# Patient Record
Sex: Male | Born: 1940 | Race: White | Hispanic: No | Smoking: Current every day smoker
Health system: Southern US, Community
[De-identification: ages and names within clinical notes are randomized; demographics above are authoritative.]

## PROBLEM LIST (undated history)

## (undated) DIAGNOSIS — J449 Chronic obstructive pulmonary disease, unspecified: Secondary | ICD-10-CM

## (undated) DIAGNOSIS — I2699 Other pulmonary embolism without acute cor pulmonale: Secondary | ICD-10-CM

## (undated) DIAGNOSIS — M1990 Unspecified osteoarthritis, unspecified site: Secondary | ICD-10-CM

## (undated) DIAGNOSIS — B019 Varicella without complication: Secondary | ICD-10-CM

## (undated) HISTORY — DX: Unspecified osteoarthritis, unspecified site: M19.90

## (undated) HISTORY — DX: Chronic obstructive pulmonary disease, unspecified: J44.9

## (undated) HISTORY — DX: Other pulmonary embolism without acute cor pulmonale: I26.99

## (undated) HISTORY — DX: Varicella without complication: B01.9

---
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
## 2006-09-23 HISTORY — PX: CHOLECYSTECTOMY: SHX55

## 2007-05-15 ENCOUNTER — Inpatient Hospital Stay: Payer: Self-pay | Admitting: Surgery

## 2007-05-17 ENCOUNTER — Other Ambulatory Visit: Payer: Self-pay

## 2012-10-22 ENCOUNTER — Inpatient Hospital Stay: Payer: Self-pay | Admitting: Internal Medicine

## 2012-10-22 DIAGNOSIS — R799 Abnormal finding of blood chemistry, unspecified: Secondary | ICD-10-CM | POA: Diagnosis not present

## 2012-10-22 DIAGNOSIS — J9600 Acute respiratory failure, unspecified whether with hypoxia or hypercapnia: Secondary | ICD-10-CM | POA: Diagnosis not present

## 2012-10-22 DIAGNOSIS — R748 Abnormal levels of other serum enzymes: Secondary | ICD-10-CM | POA: Diagnosis not present

## 2012-10-22 DIAGNOSIS — M1990 Unspecified osteoarthritis, unspecified site: Secondary | ICD-10-CM | POA: Diagnosis present

## 2012-10-22 DIAGNOSIS — Z8379 Family history of other diseases of the digestive system: Secondary | ICD-10-CM | POA: Diagnosis not present

## 2012-10-22 DIAGNOSIS — J159 Unspecified bacterial pneumonia: Secondary | ICD-10-CM | POA: Diagnosis not present

## 2012-10-22 DIAGNOSIS — R0602 Shortness of breath: Secondary | ICD-10-CM | POA: Diagnosis not present

## 2012-10-22 DIAGNOSIS — R0989 Other specified symptoms and signs involving the circulatory and respiratory systems: Secondary | ICD-10-CM | POA: Diagnosis not present

## 2012-10-22 DIAGNOSIS — J189 Pneumonia, unspecified organism: Secondary | ICD-10-CM | POA: Diagnosis not present

## 2012-10-22 DIAGNOSIS — R6889 Other general symptoms and signs: Secondary | ICD-10-CM | POA: Diagnosis not present

## 2012-10-22 DIAGNOSIS — Z9089 Acquired absence of other organs: Secondary | ICD-10-CM | POA: Diagnosis not present

## 2012-10-22 DIAGNOSIS — J441 Chronic obstructive pulmonary disease with (acute) exacerbation: Secondary | ICD-10-CM | POA: Diagnosis not present

## 2012-10-22 DIAGNOSIS — I214 Non-ST elevation (NSTEMI) myocardial infarction: Secondary | ICD-10-CM | POA: Diagnosis not present

## 2012-10-22 DIAGNOSIS — F17200 Nicotine dependence, unspecified, uncomplicated: Secondary | ICD-10-CM | POA: Diagnosis not present

## 2012-10-22 DIAGNOSIS — Z8249 Family history of ischemic heart disease and other diseases of the circulatory system: Secondary | ICD-10-CM | POA: Diagnosis not present

## 2012-10-22 DIAGNOSIS — J440 Chronic obstructive pulmonary disease with acute lower respiratory infection: Secondary | ICD-10-CM | POA: Diagnosis not present

## 2012-10-22 DIAGNOSIS — Z801 Family history of malignant neoplasm of trachea, bronchus and lung: Secondary | ICD-10-CM | POA: Diagnosis not present

## 2012-10-22 DIAGNOSIS — R0902 Hypoxemia: Secondary | ICD-10-CM | POA: Diagnosis not present

## 2012-10-22 LAB — CBC
HCT: 45.3 % (ref 40.0–52.0)
HGB: 15 g/dL (ref 13.0–18.0)
MCH: 29.9 pg (ref 26.0–34.0)
MCV: 90 fL (ref 80–100)
Platelet: 160 10*3/uL (ref 150–440)
RBC: 5.03 10*6/uL (ref 4.40–5.90)
RDW: 13.1 % (ref 11.5–14.5)
WBC: 10.3 10*3/uL (ref 3.8–10.6)

## 2012-10-22 LAB — COMPREHENSIVE METABOLIC PANEL
Albumin: 3.9 g/dL (ref 3.4–5.0)
Alkaline Phosphatase: 105 U/L (ref 50–136)
Anion Gap: 7 (ref 7–16)
Co2: 24 mmol/L (ref 21–32)
Creatinine: 0.87 mg/dL (ref 0.60–1.30)
EGFR (African American): >60
EGFR (Non-African Amer.): >60
Glucose: 112 mg/dL — ABNORMAL HIGH (ref 65–99)
Osmolality: 279 (ref 275–301)
Potassium: 4 mmol/L (ref 3.5–5.1)
SGOT(AST): 28 U/L (ref 15–37)
SGPT (ALT): 30 U/L (ref 12–78)
Total Protein: 7.6 g/dL (ref 6.4–8.2)

## 2012-10-22 LAB — PROTIME-INR
INR: 1
Prothrombin Time: 13.5 secs (ref 11.5–14.7)

## 2012-10-22 LAB — CK TOTAL AND CKMB (NOT AT ARMC): CK, Total: 135 U/L (ref 35–232)

## 2012-10-22 LAB — TROPONIN I: Troponin-I: 0.07 ng/mL — ABNORMAL HIGH

## 2012-10-22 LAB — RAPID INFLUENZA A&B ANTIGENS

## 2012-10-23 LAB — CBC WITH DIFFERENTIAL/PLATELET
Basophil %: 0.2 %
Eosinophil %: 0 %
HCT: 41.7 % (ref 40.0–52.0)
HGB: 13.8 g/dL (ref 13.0–18.0)
MCH: 29.9 pg (ref 26.0–34.0)
MCHC: 33.1 g/dL (ref 32.0–36.0)
MCV: 90 fL (ref 80–100)
Monocyte #: 0.4 x10 3/mm (ref 0.2–1.0)
Monocyte %: 4 %
Neutrophil %: 84.1 %
Platelet: 158 10*3/uL (ref 150–440)
RDW: 12.7 % (ref 11.5–14.5)
WBC: 10.4 10*3/uL (ref 3.8–10.6)

## 2012-10-23 LAB — LIPID PANEL
Cholesterol: 143 mg/dL (ref 0–200)
HDL Cholesterol: 26 mg/dL — ABNORMAL LOW (ref 40–60)
Ldl Cholesterol, Calc: 104 mg/dL — ABNORMAL HIGH (ref 0–100)
VLDL Cholesterol, Calc: 13 mg/dL (ref 5–40)

## 2012-10-23 LAB — TROPONIN I
Troponin-I: 0.04 ng/mL
Troponin-I: 0.03 ng/mL

## 2012-10-23 LAB — BASIC METABOLIC PANEL
Anion Gap: 7 (ref 7–16)
BUN: 18 mg/dL (ref 7–18)
Chloride: 108 mmol/L — ABNORMAL HIGH (ref 98–107)
Co2: 25 mmol/L (ref 21–32)
EGFR (Non-African Amer.): >60
Glucose: 189 mg/dL — ABNORMAL HIGH (ref 65–99)
Osmolality: 286 (ref 275–301)
Potassium: 4.2 mmol/L (ref 3.5–5.1)

## 2012-10-23 LAB — MAGNESIUM: Magnesium: 1.9 mg/dL

## 2012-10-23 LAB — CK-MB
CK-MB: 1.4 ng/mL (ref 0.5–3.6)
CK-MB: 2.7 ng/mL (ref 0.5–3.6)

## 2012-10-23 LAB — RAPID INFLUENZA A&B ANTIGENS

## 2012-10-24 LAB — BASIC METABOLIC PANEL
BUN: 23 mg/dL — ABNORMAL HIGH (ref 7–18)
Calcium, Total: 8.2 mg/dL — ABNORMAL LOW (ref 8.5–10.1)
Chloride: 106 mmol/L (ref 98–107)
Co2: 26 mmol/L (ref 21–32)
Creatinine: 0.91 mg/dL (ref 0.60–1.30)
EGFR (African American): >60
Osmolality: 285 (ref 275–301)

## 2012-10-24 LAB — HEMOGLOBIN A1C: Hemoglobin A1C: 5.7 % (ref 4.2–6.3)

## 2012-10-26 DIAGNOSIS — I517 Cardiomegaly: Secondary | ICD-10-CM

## 2012-10-28 LAB — CULTURE, BLOOD (SINGLE)

## 2012-10-28 LAB — CREATININE, SERUM: Creatinine: 0.91 mg/dL (ref 0.60–1.30)

## 2012-11-02 ENCOUNTER — Ambulatory Visit (INDEPENDENT_AMBULATORY_CARE_PROVIDER_SITE_OTHER): Payer: Medicare Other | Admitting: Adult Health

## 2012-11-02 ENCOUNTER — Encounter: Payer: Self-pay | Admitting: Adult Health

## 2012-11-02 VITALS — BP 143/83 | HR 97 | Temp 97.8°F | Resp 16 | Ht 75.0 in | Wt 234.0 lb

## 2012-11-02 DIAGNOSIS — Z0000 Encounter for general adult medical examination without abnormal findings: Secondary | ICD-10-CM

## 2012-11-02 DIAGNOSIS — M25519 Pain in unspecified shoulder: Secondary | ICD-10-CM

## 2012-11-02 DIAGNOSIS — M25561 Pain in right knee: Secondary | ICD-10-CM

## 2012-11-02 DIAGNOSIS — J439 Emphysema, unspecified: Secondary | ICD-10-CM

## 2012-11-02 DIAGNOSIS — M25569 Pain in unspecified knee: Secondary | ICD-10-CM | POA: Diagnosis not present

## 2012-11-02 DIAGNOSIS — Z125 Encounter for screening for malignant neoplasm of prostate: Secondary | ICD-10-CM | POA: Diagnosis not present

## 2012-11-02 DIAGNOSIS — J438 Other emphysema: Secondary | ICD-10-CM

## 2012-11-02 DIAGNOSIS — Z1211 Encounter for screening for malignant neoplasm of colon: Secondary | ICD-10-CM | POA: Diagnosis not present

## 2012-11-02 DIAGNOSIS — M25512 Pain in left shoulder: Secondary | ICD-10-CM

## 2012-11-02 MED ORDER — TIOTROPIUM BROMIDE MONOHYDRATE 18 MCG IN CAPS: 18.0000 ug | ORAL_CAPSULE | Freq: Every day | RESPIRATORY_TRACT | Status: DC

## 2012-11-02 MED ORDER — FLUTICASONE-SALMETEROL 250-50 MCG/DOSE IN AEPB: 1.0000 | INHALATION_SPRAY | Freq: Two times a day (BID) | RESPIRATORY_TRACT | Status: DC

## 2012-11-02 MED ORDER — FUROSEMIDE 20 MG PO TABS: ORAL_TABLET | ORAL | Status: DC

## 2012-11-02 NOTE — Patient Instructions (Addendum)
  Thank you for choosing [facility] for your health care needs.  Please have your labs drawn prior to leaving. I am checking the levels of your prostate.  I will notify you of your results.  I have called in a prescription for Lasix (fluid pill) 20 mg. Take 1/2 tablet daily as needed for swelling of your legs. If 1/2 tablet does not improve the swelling then take a whole tablet.  I have referred you for a colonoscopy.   I have also referred you to [facility] for evaluation of your left shoulder and numbness.  We will be obtaining your records from your previous hospitalization.

## 2012-11-03 ENCOUNTER — Encounter: Payer: Self-pay | Admitting: Adult Health

## 2012-11-03 DIAGNOSIS — Z125 Encounter for screening for malignant neoplasm of prostate: Secondary | ICD-10-CM | POA: Insufficient documentation

## 2012-11-03 DIAGNOSIS — Z0000 Encounter for general adult medical examination without abnormal findings: Secondary | ICD-10-CM | POA: Insufficient documentation

## 2012-11-03 DIAGNOSIS — M25561 Pain in right knee: Secondary | ICD-10-CM | POA: Insufficient documentation

## 2012-11-03 DIAGNOSIS — Z1211 Encounter for screening for malignant neoplasm of colon: Secondary | ICD-10-CM | POA: Insufficient documentation

## 2012-11-03 DIAGNOSIS — J439 Emphysema, unspecified: Secondary | ICD-10-CM | POA: Insufficient documentation

## 2012-11-03 DIAGNOSIS — M25512 Pain in left shoulder: Secondary | ICD-10-CM | POA: Insufficient documentation

## 2012-11-03 LAB — PSA: PSA: 6.26 ng/mL — ABNORMAL HIGH (ref 0.10–4.00)

## 2012-11-03 NOTE — Assessment & Plan Note (Signed)
Patient reports hx of rotator cuff injury. Appears that his symptoms are worsening with now some paresthesia and weakness in grip. Will refer back to orthopedic at [facility] for further evaluation and management.

## 2012-11-03 NOTE — Assessment & Plan Note (Signed)
Physical exam WNL. Patient has some bilateral LE edema. He is unable to tell me results of echo or other tests recently performed at [facility] during hospitalization. He does not know what labs were drawn. Will request this medical records to prevent duplication. Also, will provide patient with prescription for lasix 20 mg to be used prn for LE edema. This was given to him at the hospital with him reporting good results. Will check PSA levels as this has never been done. Will refer for screening colonoscopy which has never been done. Refer to orthopedic for f/u on his left shoulder pain.

## 2012-11-03 NOTE — Assessment & Plan Note (Signed)
Check PSA levels. This has never been checked.

## 2012-11-03 NOTE — Assessment & Plan Note (Addendum)
Recent hospitalization at [facility] for acute respiratory failure 2/2 COPD exacerbations. Patient reports multiple test done at the hospital. Will obtain medication records from this previous hospitalization. He is currently on Advair, Spiriva. Completed course of Levaquin and Prednisone taper. Patient has 50+ pack year history of smoking. Reports quitting the day before he was hospitalized and has not smoked since. Patient is not on oxygen and does not require this at the time. Sats are 95% on RA.

## 2012-11-03 NOTE — Assessment & Plan Note (Signed)
Pt has never had a screening colonoscopy. Has not seen PCP since 1948. Will refer to GI for screening colonoscopy.

## 2012-11-03 NOTE — Progress Notes (Signed)
Request sent for medical records to [facility]

## 2012-11-03 NOTE — Assessment & Plan Note (Signed)
Patient feels this is improved. He was seen at [facility] and knee replacement was recommended. Referral will be made should patient's symptoms worsen.

## 2012-11-03 NOTE — Progress Notes (Signed)
Subjective:    Patient ID: Seth Perez, male    DOB: [birth year], 72 y.o.   MRN: [id number]  HPI  Seth Perez is a 72 y/o male who was recently hospitalized at [facility] from 10/26/12 - 10/28/12 for acute respiratory failure 2/2 COPD exacerbation. Seth Perez is here for f/u and to establish care. He has not seen a regular provider since 1948. He has seen physicians for different reasons but he reports not having a PCP. He brings with him today his discharge instructions and discharge medications; however, he is not very clear on the details of his hospitalization. We will obtain records from [facility].  He also reports:  1) Left Shoulder pain - he saw a physician at [facility] in orthopedic department (does not remember physician's name) who diagnosed him with rotator cuff injury. Patient is experiencing pain in shoulder; however, he reports that he now has some feelings of tingling and numbness in arm and hand. He also reports loss of grip. He is able to move his extremity as well as his fingers. He denies coolness to touch or change in color of skin.  2) Right knee pain - also saw orthopedic surgeon who advised him he had arthritis and needed a knee replacement. He never went back to see the surgeon and states that his knee pain is improved.   Health Maintenance:  Pneumonia Vaccine - 2008  Colonoscopy - never done  PSA - never checked    Review of Systems  Constitutional: Negative for fever, chills, activity change, appetite change and fatigue.  HENT: Negative.   Eyes: Negative.   Respiratory: Positive for cough. Negative for shortness of breath and wheezing.        Occasional cough. Nothing chronic  Cardiovascular: Positive for leg swelling. Negative for chest pain and palpitations.       Bilateral LE swelling. Better since hospitalization.  Gastrointestinal: Negative for nausea, vomiting, abdominal pain, diarrhea, constipation and blood in stool.  Endocrine: Negative.   Genitourinary: Negative  for dysuria, frequency, hematuria, flank pain and difficulty urinating.  Musculoskeletal:       Pain in left shoulder. Mild neuropathy, paresthesia  Skin: Negative.   Neurological: Positive for weakness and numbness. Negative for dizziness, tremors, light-headedness and headaches.       Numbness and weakness left upper extremity.  Psychiatric/Behavioral: Negative.     BP 143/83  Pulse 97  Temp(Src) 97.8 F (36.6 C)  Resp 16  Ht 6\' 3"  (1.905 m)  Wt 234 lb (106.142 kg)  BMI 29.25 kg/m2  SpO2 95%     Objective:   Physical Exam  Constitutional: He is oriented to person, place, and time. He appears well-developed and well-nourished. No distress.  HENT:  Head: Normocephalic and atraumatic.  Right Ear: External ear normal.  Left Ear: External ear normal.  Mouth/Throat: Oropharynx is clear and moist.  Eyes: Conjunctivae and EOM are normal. Pupils are equal, round, and reactive to light.  Cardiovascular: Normal rate and regular rhythm.  Exam reveals no gallop and no friction rub.   No murmur heard. Pulmonary/Chest: Effort normal and breath sounds normal. No respiratory distress. He has no wheezes. He has no rales. He exhibits no tenderness.  Abdominal: Soft. Bowel sounds are normal. There is no tenderness.  Musculoskeletal: He exhibits edema.  Limited ROM in Left upper extremity. Trace to 1+ bilateral LE edema  Lymphadenopathy:    He has no cervical adenopathy.  Neurological: He is alert and oriented to person, place, and time. No cranial  nerve deficit. Coordination normal.  Unequal grips bilateral upper extremity. Right hand grip stronger than the left hand.  Skin: Skin is warm.  Skin is extremely dry and scaly on all extremities.  Psychiatric: He has a normal mood and affect. His behavior is normal. Judgment and thought content normal.        Assessment & Plan:

## 2012-11-09 ENCOUNTER — Other Ambulatory Visit: Payer: Self-pay | Admitting: Adult Health

## 2012-11-09 MED ORDER — CIPROFLOXACIN HCL 500 MG PO TABS: 500.0000 mg | ORAL_TABLET | Freq: Two times a day (BID) | ORAL | Status: DC

## 2012-11-09 NOTE — Progress Notes (Signed)
Elevated PSA. Will start Cipro x 14 days. Recheck PSA. If still elevated will refer to Urology.

## 2012-11-18 ENCOUNTER — Ambulatory Visit (INDEPENDENT_AMBULATORY_CARE_PROVIDER_SITE_OTHER): Payer: Medicare Other | Admitting: Internal Medicine

## 2012-11-18 ENCOUNTER — Encounter: Payer: Self-pay | Admitting: Internal Medicine

## 2012-11-18 ENCOUNTER — Telehealth: Payer: Self-pay | Admitting: Adult Health

## 2012-11-18 ENCOUNTER — Inpatient Hospital Stay: Payer: Self-pay | Admitting: Student

## 2012-11-18 VITALS — BP 104/73 | HR 115 | Temp 97.9°F | Resp 32 | Wt 232.5 lb

## 2012-11-18 DIAGNOSIS — J9600 Acute respiratory failure, unspecified whether with hypoxia or hypercapnia: Secondary | ICD-10-CM

## 2012-11-18 DIAGNOSIS — R000 Tachycardia, unspecified: Secondary | ICD-10-CM | POA: Diagnosis not present

## 2012-11-18 DIAGNOSIS — J441 Chronic obstructive pulmonary disease with (acute) exacerbation: Secondary | ICD-10-CM | POA: Diagnosis not present

## 2012-11-18 DIAGNOSIS — I2699 Other pulmonary embolism without acute cor pulmonale: Secondary | ICD-10-CM | POA: Diagnosis not present

## 2012-11-18 DIAGNOSIS — R0602 Shortness of breath: Secondary | ICD-10-CM | POA: Diagnosis not present

## 2012-11-18 DIAGNOSIS — N179 Acute kidney failure, unspecified: Secondary | ICD-10-CM | POA: Diagnosis not present

## 2012-11-18 LAB — COMPREHENSIVE METABOLIC PANEL
Albumin: 3.3 g/dL — ABNORMAL LOW (ref 3.4–5.0)
Alkaline Phosphatase: 104 U/L (ref 50–136)
Anion Gap: 10 (ref 7–16)
BUN: 18 mg/dL (ref 7–18)
Bilirubin,Total: 0.6 mg/dL (ref 0.2–1.0)
Calcium, Total: 8.7 mg/dL (ref 8.5–10.1)
Creatinine: 1.5 mg/dL — ABNORMAL HIGH (ref 0.60–1.30)
EGFR (African American): 53 — ABNORMAL LOW
EGFR (Non-African Amer.): 46 — ABNORMAL LOW
Glucose: 160 mg/dL — ABNORMAL HIGH (ref 65–99)
Potassium: 4.4 mmol/L (ref 3.5–5.1)
SGOT(AST): 46 U/L — ABNORMAL HIGH (ref 15–37)
SGPT (ALT): 65 U/L (ref 12–78)
Sodium: 138 mmol/L (ref 136–145)
Total Protein: 7.7 g/dL (ref 6.4–8.2)

## 2012-11-18 LAB — CBC WITH DIFFERENTIAL/PLATELET
Eosinophil #: 0 10*3/uL (ref 0.0–0.7)
Eosinophil %: 0.1 %
HCT: 40.6 % (ref 40.0–52.0)
Lymphocyte #: 1.5 10*3/uL (ref 1.0–3.6)
MCH: 30.5 pg (ref 26.0–34.0)
MCHC: 33.8 g/dL (ref 32.0–36.0)
MCV: 90 fL (ref 80–100)
Monocyte %: 9.7 %
RBC: 4.5 10*6/uL (ref 4.40–5.90)
RDW: 13.1 % (ref 11.5–14.5)
WBC: 11.9 10*3/uL — ABNORMAL HIGH (ref 3.8–10.6)

## 2012-11-18 LAB — TROPONIN I: Troponin-I: 0.73 ng/mL — ABNORMAL HIGH

## 2012-11-18 NOTE — Telephone Encounter (Signed)
Patient Information:  Caller Name: Liborio Nixon  Phone: [phone]  Patient: Seth Perez, Seth Perez  Gender: Male  DOB: 1941/06/18  Age: 72 Years  PCP: Orville Govern  Office Follow Up:  Does the office need to follow up with this patient?: No  Instructions For The Office: N/A  RN Note:  Allergic reaction question to medication.  Started taking Cipro on 02/18 and has been having a poor appetite, Since 02/21 has had 2 x loose stools,  Had a cold sweat with temperature normal but breathing was elevated 02/25.  Had heaves during the night.  He has eaten fruit and drinking.  Is urinating regularly but not normal amount.  Feels weak and tired.  Care advice given, but per nursing judgement feels like patient may need to be seen.  Symptoms  Reason For Call & Symptoms: Allergic reaction question.  Started taking Cipro on 02/18 and has been having a poor appetite, Has had 2 x loose stools,  Had a cold sweat with temperature normal  Reviewed Health History In EMR: Yes  Reviewed Medications In EMR: Yes  Reviewed Allergies In EMR: Yes  Reviewed Surgeries / Procedures: Yes  Date of Onset of Symptoms: 11/13/2012  Guideline(s) Used:  No Protocol Available - Sick Adult  Dizziness  Disposition Per Guideline:   Home Care  Reason For Disposition Reached:   Poor fluid intake probably causing dizziness  Advice Given:  Some Causes of Temporary Dizziness:  Poor Fluid Intake - Not drinking enough fluids and being a little dehydrated is a common cause of temporary dizziness. This is always worse during hot weather.  Standing Up Suddenly - Standing up suddenly (especially getting out of bed) or prolonged standing in one place are common causes of temporary dizziness. Not drinking enough fluids always makes it worse. Certain medications can cause or increase this type of dizziness (e.g., blood pressure medications).  Drink Fluids:  Drink several glasses of fruit juice, other clear fluids, or water. This will improve  hydration and blood glucose. If you have a fever or have had heat exposure, make sure the fluids are cold.  Call Back If:  Still feel dizzy after 2 hours of rest and fluids  Passes out (faints)  You become worse.  RN Overrode Recommendation:  Make Appointment  Multiple symptoms in last 24 hours.  Appointment Scheduled:  11/18/2012 16:15:00 Appointment Scheduled Provider:  Duncan Dull (Adults only)

## 2012-11-18 NOTE — Progress Notes (Signed)
Patient ID: Seth Perez, male   DOB: [birth year], 72 y.o.   MRN: [id number]   Patient Active Problem List  Diagnosis  . Pain in left shoulder  . Pain in right knee  . COPD (chronic obstructive pulmonary disease) with emphysema  . Colon cancer screening  . Prostate cancer screening  . Routine general medical examination at a health care facility  . Acute respiratory failure with hypoxia    Subjective:  CC:   Chief Complaint  Patient presents with  . Shortness of Breath    dizziness and nausea    HPI:   Seth Perez a 72 y.o. male who presents Shortness of breath.  Symptoms started on Monday with excessive dyspnea after taking out the trash. He notes that he had skipped his midnight dose of Symbicort.  Then on Tuesday he developed rigors, myalgias, nausea and  dry heaves.  Today in the office he is dyspneic and his respiratory rate is 35/minute,  Room air sats were 84% by pulse oximetry. He is accompanied by a family friend.   Past Medical History  Diagnosis Date  . Arthritis   . Chicken pox   . COPD (chronic obstructive pulmonary disease)     Past Surgical History  Procedure Laterality Date  . Cholecystectomy  2008  . Tonsillectomy and adenoidectomy  1947       The following portions of the patient's history were reviewed and updated as appropriate: Allergies, current medications, and problem list.    Review of Systems:   12 Pt  review of systems was negative except those addressed in the HPI,     History   Social History  . Marital Status: Legally Separated    Spouse Name: N/A    Number of Children: N/A  . Years of Education: N/A   Occupational History  . Not on file.   Social History Main Topics  . Smoking status: Former Smoker -- 1.00 packs/day for 50 years    Types: Cigarettes  . Smokeless tobacco: Not on file  . Alcohol Use: 1.0 oz/week    2 drink(s) per week  . Drug Use: No  . Sexually Active: Not on file   Other Topics Concern  .  Not on file   Social History Narrative  . No narrative on file    Objective:  BP 104/73  Pulse 115  Temp(Src) 97.9 F (36.6 C) (Oral)  Resp 32  Wt 232 lb 8 oz (105.461 kg)  BMI 29.06 kg/m2  SpO2 84%  General appearance: alert, cooperative and appears stated age Ears: normal TM's and external ear canals both ears Throat: lips, mucosa, and tongue normal; teeth and gums normal Neck: no adenopathy, no carotid bruit, supple, symmetrical, trachea midline and thyroid not enlarged, symmetric, no tenderness/mass/nodules Back: symmetric, no curvature. ROM normal. No CVA tenderness. Lungs: clear to auscultation bilaterally Heart: regular rate and rhythm, S1, S2 normal, no murmur, click, rub or gallop Abdomen: soft, non-tender; bowel sounds normal; no masses,  no organomegaly Pulses: 2+ and symmetric Skin: Skin color, texture, turgor normal. No rashes or lesions Lymph nodes: Cervical, supraclavicular, and axillary nodes normal.  Assessment and Plan:  Acute respiratory failure with hypoxia Patient was acutely dyspneic and hypoxicon room air at 84%. EMS was called and transported patient to [facility] emergency room. His symptoms are consistent with flu but the test was not done today due to the urgent need for stabilization.   Updated Medication List Outpatient Encounter Prescriptions as of 11/18/2012  Medication Sig  Dispense Refill  . ciprofloxacin (CIPRO) 500 MG tablet Take 1 tablet (500 mg total) by mouth 2 (two) times daily.  28 tablet  0  . Fluticasone-Salmeterol (ADVAIR) 250-50 MCG/DOSE AEPB Inhale 1 puff into the lungs every 12 (twelve) hours.  60 each  5  . furosemide (LASIX) 20 MG tablet Take 1/2 tablet in the morning as needed for leg swelling.  30 tablet  3  . tiotropium (SPIRIVA) 18 MCG inhalation capsule Place 1 capsule (18 mcg total) into inhaler and inhale daily.  30 capsule  5  . predniSONE (STERAPRED UNI-PAK) 10 MG tablet Take 10 mg by mouth daily.       No  facility-administered encounter medications on file as of 11/18/2012.

## 2012-11-19 ENCOUNTER — Telehealth: Payer: Self-pay | Admitting: Adult Health

## 2012-11-19 DIAGNOSIS — R000 Tachycardia, unspecified: Secondary | ICD-10-CM

## 2012-11-19 DIAGNOSIS — R7989 Other specified abnormal findings of blood chemistry: Secondary | ICD-10-CM

## 2012-11-19 LAB — BASIC METABOLIC PANEL
Anion Gap: 10 (ref 7–16)
Creatinine: 1.32 mg/dL — ABNORMAL HIGH (ref 0.60–1.30)
EGFR (African American): >60
EGFR (Non-African Amer.): 54 — ABNORMAL LOW
Glucose: 224 mg/dL — ABNORMAL HIGH (ref 65–99)
Potassium: 4.3 mmol/L (ref 3.5–5.1)
Sodium: 138 mmol/L (ref 136–145)

## 2012-11-19 LAB — CBC WITH DIFFERENTIAL/PLATELET
Basophil #: 0 10*3/uL (ref 0.0–0.1)
Basophil %: 0.3 %
Eosinophil #: 0 10*3/uL (ref 0.0–0.7)
Eosinophil %: 0 %
HCT: 37.4 % — ABNORMAL LOW (ref 40.0–52.0)
HGB: 12.3 g/dL — ABNORMAL LOW (ref 13.0–18.0)
Lymphocyte #: 0.9 10*3/uL — ABNORMAL LOW (ref 1.0–3.6)
Lymphocyte %: 8.9 %
MCH: 29.9 pg (ref 26.0–34.0)
Neutrophil #: 8.6 10*3/uL — ABNORMAL HIGH (ref 1.4–6.5)
Platelet: 188 10*3/uL (ref 150–440)

## 2012-11-19 LAB — CK TOTAL AND CKMB (NOT AT ARMC): CK-MB: 2.6 ng/mL (ref 0.5–3.6)

## 2012-11-19 LAB — LIPID PANEL
Cholesterol: 151 mg/dL (ref 0–200)
HDL Cholesterol: 26 mg/dL — ABNORMAL LOW (ref 40–60)
Ldl Cholesterol, Calc: 111 mg/dL — ABNORMAL HIGH (ref 0–100)
Triglycerides: 71 mg/dL (ref 0–200)

## 2012-11-19 LAB — TROPONIN I
Troponin-I: 0.7 ng/mL — ABNORMAL HIGH
Troponin-I: 0.7 ng/mL — ABNORMAL HIGH

## 2012-11-19 LAB — MAGNESIUM: Magnesium: 1.6 mg/dL — ABNORMAL LOW

## 2012-11-19 NOTE — Telephone Encounter (Signed)
Spoke to patient and he  will keep scheduled lab appointmnet

## 2012-11-19 NOTE — Telephone Encounter (Signed)
Pt calling from hospital regarding lab appt with [place] on 3/4 at 11:00.  Pt states this lab work is to recheck his PSA since he has been taking Prevo, however the pt states the hospital has not been giving him this medication.  Please advise pt if this test should be cancelled since he is not taking the medication.  Pt is at [facility] phone number [phone] but states he was told they may be moving him down the hall.  Please advise, concerned pt.

## 2012-11-19 NOTE — Telephone Encounter (Signed)
opened in error

## 2012-11-20 DIAGNOSIS — R0602 Shortness of breath: Secondary | ICD-10-CM

## 2012-11-21 ENCOUNTER — Encounter: Payer: Self-pay | Admitting: Internal Medicine

## 2012-11-21 DIAGNOSIS — J9601 Acute respiratory failure with hypoxia: Secondary | ICD-10-CM | POA: Insufficient documentation

## 2012-11-21 NOTE — Assessment & Plan Note (Addendum)
Patient was acutely dyspneic and hypoxicon room air at 84%. EMS was called and transported patient to [facility] emergency room. His symptoms are consistent with flu but the test was not done today due to the urgent need for stabilization.

## 2012-11-24 ENCOUNTER — Telehealth: Payer: Self-pay | Admitting: General Practice

## 2012-11-24 ENCOUNTER — Encounter: Payer: Self-pay | Admitting: Adult Health

## 2012-11-24 ENCOUNTER — Encounter: Payer: Self-pay | Admitting: Pulmonary Disease

## 2012-11-24 ENCOUNTER — Ambulatory Visit (INDEPENDENT_AMBULATORY_CARE_PROVIDER_SITE_OTHER): Payer: Medicare Other | Admitting: Adult Health

## 2012-11-24 ENCOUNTER — Other Ambulatory Visit (INDEPENDENT_AMBULATORY_CARE_PROVIDER_SITE_OTHER): Payer: Medicare Other

## 2012-11-24 ENCOUNTER — Ambulatory Visit (INDEPENDENT_AMBULATORY_CARE_PROVIDER_SITE_OTHER): Payer: Medicare Other | Admitting: Pulmonary Disease

## 2012-11-24 VITALS — BP 112/80 | HR 105 | Temp 97.2°F | Ht 75.0 in | Wt 228.0 lb

## 2012-11-24 VITALS — BP 125/88 | HR 90 | Temp 97.8°F | Resp 18

## 2012-11-24 DIAGNOSIS — Z125 Encounter for screening for malignant neoplasm of prostate: Secondary | ICD-10-CM

## 2012-11-24 LAB — CULTURE, BLOOD (SINGLE)

## 2012-11-24 NOTE — Progress Notes (Signed)
Subjective:    Patient ID: Seth Perez, male    DOB: 16-Apr-1941, 72 y.o.   MRN: [id number]  HPI  This is a very pleasant 72 year old male who comes her office today for evaluation of a pulmonary embolism and COPD. He had a normal childhood without respiratory illnesses. Unfortunately he started smoking one pack of cigarettes daily at age 59 and continued through January 2014. He was hospitalized in January 2014 for pneumonia and a COPD exacerbation. He was discharged on prednisone, with Spiriva, and ciprofloxacin. Approximately one week after hospital discharge he experienced increasing nausea, vomiting, diarrhea, chest pain, and shortness of breath. He was admitted to [facility] again in February 2014 and was found to have a markedly elevated troponin. Cardiology was consulted and ordered a CT chest which showed large bilateral pulmonary emboli. He was treated with anticoagulation which was converted to Xarelto at hospital discharge. Prednisone was continued in a tapering fashion and ciprofloxacin was changed to Levaquin for a presumed COPD exacerbation. He was discharged on Spiriva, Advair, and albuterol.  He states that his shortness of breath has improved slightly since hospital discharge but he is still somewhat dyspneic with talking for too long and minimal activity. His leg swelling has improved greatly since his hospital discharge.  He tells me that in the year prior to his hospitalization he was experiencing some increasing shortness of breath but he was still able to remain quite active. 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 He said that if he ever felt limited by his health it was due to knee and shoulder pain rather than any shortness of breath.  He also stated that he said leg swelling in his right greater than left legs for at least 3 years. This had been evaluated in  the past and he was told he had arthritis but he never been evaluated for a blood clot.  Past Medical History  Diagnosis Date  . Arthritis   . Chicken pox   . COPD (chronic obstructive pulmonary disease)   . Pulmonary emboli     on Xarelto     Family History  Problem Relation Age of Onset  . Arthritis Seth Perez   . Lung cancer Seth Perez     was a smoker  . Breast cancer Seth Perez   . Heart disease Seth Perez   . Heart disease Seth Perez   . Asthma Seth Perez   . Emphysema Seth Perez      History   Social History  . Marital Status: Legally Separated    Spouse Name: N/A    Number of Children: N/A  . Years of Education: N/A   Occupational History  . Not on file.   Social History Main Topics  . Smoking status: Former Smoker -- 1.00 packs/day for 50 years    Types: Cigarettes    Quit date: 10/12/2012  . Smokeless tobacco: Never Used  . Alcohol Use: 1.0 oz/week    2 drink(s) per week  . Drug Use: No  . Sexually Active: Not on file   Other Topics Concern  . Not on file   Social History Narrative  . No narrative on file     No Known Allergies   Outpatient Prescriptions Prior to Visit  Medication Sig Dispense Refill  . Fluticasone-Salmeterol (ADVAIR) 250-50 MCG/DOSE AEPB Inhale 1 puff into the lungs every 12 (twelve) hours.  60 each  5  . furosemide (LASIX) 20 MG tablet Take 1/2 tablet in the morning as needed for leg swelling.  30 tablet  3  . predniSONE (STERAPRED UNI-PAK) 10 MG tablet Tapered dose as directed      . tiotropium (SPIRIVA) 18 MCG inhalation capsule Place 1 capsule (18 mcg total) into inhaler and inhale daily.  30 capsule  5  . ciprofloxacin (CIPRO) 500 MG tablet Take 1 tablet (500 mg total) by mouth 2 (two) times daily.  28 tablet  0   No facility-administered medications prior to visit.      Review of Systems  Constitutional: Negative for fever, chills, activity change and appetite change.  HENT: Positive for nosebleeds  and postnasal drip. Negative for hearing loss, ear pain, congestion, rhinorrhea, sneezing, neck pain, neck stiffness and sinus pressure.   Eyes: Negative for redness, itching and visual disturbance.  Respiratory: Positive for cough and shortness of breath. Negative for chest tightness and wheezing.   Cardiovascular: Negative for chest pain, palpitations and leg swelling.  Gastrointestinal: Negative for nausea, vomiting, abdominal pain, diarrhea, constipation, blood in stool and abdominal distention.  Musculoskeletal: Positive for arthralgias. Negative for myalgias, joint swelling and gait problem.  Skin: Negative for rash.  Neurological: Positive for light-headedness. Negative for dizziness, numbness and headaches.  Hematological: Does not bruise/bleed easily.  Psychiatric/Behavioral: Negative for confusion and dysphoric mood.       Objective:   Physical Exam  Filed Vitals:   11/24/12 1147  BP: 112/80  Pulse: 105  Temp: 97.2 F (36.2 C)  TempSrc: Oral  Height: 6\' 3"  (1.905 m)  Weight: 228 lb (103.42 kg)  SpO2: 92%   Gen: no acute distress HEENT: NCAT, PERRL, EOMi, OP clear, neck supple without masses PULM: CTA B CV: RRR, no mgr, no JVD AB: BS+, soft, nontender, no hsm Ext: warm, trace pretibial edema, compression stockings, no clubbing, no cyanosis Derm: no rash or skin breakdown Neuro: A&Ox4, CN II-XII intact, strength 5/5 in all 4 extremities   February 2014 CT chest [facility]>> large bilateral pulmonary embolism, pleural-based pulmonary nodule in the right upper lobe (1.2 cm) as well as in the right middle lobe (4mm) 11/24/2012 Simple spiro> ratio 65%, FEV1 2.53 L (64% pred)    Assessment & Plan:   Acute pulmonary embolism The most likely explanation for the large, bilateral pulmonary emboli Seth Perez experienced in February 2014 is his hospitalization which occurred 2 weeks beforehand for pneumonia and COPD. He states that he has had years of leg swelling but was never  thoroughly evaluated for this so I question whether or not there could have been some preceding clot issues. At this point given the obvious risk factor it is reasonable to consider this a provoked pulmonary embolism and treat him for 6 months.  Plan: -Continues Xarelto for 6 months -Prior to discontinuing Xarelto, check lower ext Doppler ultrasound and repeat D-dimer -d-dimer today  Solitary pulmonary nodule I suspect this finding is related to the pneumonia he experienced in January 2014. It needs to be followed given his heavy prior smoking history.  Plan: -Repeat CT chest without contrast May 2014  COPD (chronic obstructive pulmonary disease) with emphysema COPD: GOLD Grade C, Stage 3 Combined recommendations from the [employer], [employer] of [employer] Physicians, Designer, television/film set, European Respiratory Society (Qaseem A et al, Ann Intern Med. 2011;155(3):179) recommends tobacco cessation, pulmonary rehab (for symptomatic patients with an FEV1 < 50% predicted), supplemental oxygen (for patients with SaO2 <88% or paO2 <55),  and appropriate bronchodilator therapy.  In regards to long acting bronchodilators, they recommend monotherapy (FEV1 60-80% with symptoms weak evidence, FEV1 with symptoms <60% strong evidence), or combination therapy (FEV1 <60% with symptoms, strong recommendation, moderate evidence).  One should also provide patients with annual immunizations and consider therapy for prevention of COPD exacerbations (ie. roflumilast or azithromycin) when appopriate.  -O2 therapy: not indicated -Immunizations:  Pneumonia 2008, Flu 2014 -Tobacco use: quit 2014 -Exercise: can start pulm rehab after cleared by Dr. Mariah Milling -Bronchodilator therapy: Stop advair, no real need for this given moderate obstruction; continue spiriva and albuterol -Exacerbation prevention: spiriva    Updated Medication List Outpatient Encounter Prescriptions as of 11/24/2012   Medication Sig Dispense Refill  . albuterol (VENTOLIN HFA) 108 (90 BASE) MCG/ACT inhaler Inhale 2 puffs into the lungs every 6 (six) hours as needed for wheezing.      . Fluticasone-Salmeterol (ADVAIR) 250-50 MCG/DOSE AEPB Inhale 1 puff into the lungs every 12 (twelve) hours.  60 each  5  . furosemide (LASIX) 20 MG tablet Take 1/2 tablet in the morning as needed for leg swelling.  30 tablet  3  . predniSONE (STERAPRED UNI-PAK) 10 MG tablet Tapered dose as directed      . Rivaroxaban (XARELTO) 15 MG TABS tablet Take 15 mg by mouth 2 (two) times daily. Take this dose and schedule until 12/09/12 which you will start Xarelto 20 mg once a day in the evening.      . tiotropium (SPIRIVA) 18 MCG inhalation capsule Place 1 capsule (18 mcg total) into inhaler and inhale daily.  30 capsule  5  . [DISCONTINUED] ciprofloxacin (CIPRO) 500 MG tablet Take 1 tablet (500 mg total) by mouth 2 (two) times daily.  28 tablet  0   No facility-administered encounter medications on file as of 11/24/2012.

## 2012-11-24 NOTE — Telephone Encounter (Signed)
Pt came in today for labs and son stated that when mr Kipper was in hospital the dr there said he needed to be set up with [facility] dr for his copd Also wanted to know if we needed to set his cardology follow up for 1-2 weeks  He saw dr Mariah Milling at [facility]

## 2012-11-24 NOTE — Telephone Encounter (Signed)
fmla paperwork in box

## 2012-11-24 NOTE — Patient Instructions (Signed)
  I will make an appointment for you to see Dr. Kendrick Fries, Pulmonology.  Please call Dr. Mariah Milling and Arida's office to schedule your post hospitalization f/u visit.  Return to clinic to see me in 1 month or sooner if needed.

## 2012-11-24 NOTE — Telephone Encounter (Signed)
Pt states that he is feeling better after the stay, still not 100%. Came into the office today for PSA recheck. Zella Ball was scheduling the follow up appt.

## 2012-11-24 NOTE — Assessment & Plan Note (Addendum)
Patient was just recently discharged from [facility] on 11/22/2012. He was seen in clinic today for followup of hospitalization. He is exhibiting shortness of breath with exertion, weakness. He was also recently diagnosed with chronic diastolic heart failure. Patient will be making a followup appointment with Dr. Mariah Milling. I am referring him to Dr. Kendrick Fries for his COPD and recent pulmonary emboli. Patient is currently staying with his son. Followup in my clinic in one month or sooner if needed. He has all of his medications. I have advised him to request refills prior to completely running out of his medications.

## 2012-11-24 NOTE — Progress Notes (Signed)
Subjective:    Patient ID: Seth Perez, male    DOB: Nov 02, 1940, 72 y.o.   MRN: [id number]  HPI  Patient is a 73 year old male who presents to clinic today for followup of recent hospitalization at [facility]. This is his second hospitalization within one month. He was hospitalized from 11/18/12 - 11/22/12 for acute respiratory failure secondary to bilateral pulmonary emboli, chronic obstructive pulmonary disease exacerbation, chronic diastolic heart failure. He was also hospitalized from 10/26/12 - 10/28/12 for acute respiratory failure 2/2 COPD exacerbation. He presents to clinic today with his son. Patient is feeling improved; however, he is still significantly short of breath with exertion. His oxygen saturation is currently 94% on room air.   Current Outpatient Prescriptions on File Prior to Visit  Medication Sig Dispense Refill  . Fluticasone-Salmeterol (ADVAIR) 250-50 MCG/DOSE AEPB Inhale 1 puff into the lungs every 12 (twelve) hours.  60 each  5  . furosemide (LASIX) 20 MG tablet Take 1/2 tablet in the morning as needed for leg swelling.  30 tablet  3  . predniSONE (STERAPRED UNI-PAK) 10 MG tablet Tapered dose as directed      . tiotropium (SPIRIVA) 18 MCG inhalation capsule Place 1 capsule (18 mcg total) into inhaler and inhale daily.  30 capsule  5   No current facility-administered medications on file prior to visit.      Review of Systems  Constitutional: Positive for fatigue. Negative for fever and chills.  HENT: Positive for congestion and postnasal drip. Negative for nosebleeds.        Dry nasal mucosa. Patient reports this is 2/2 oxygen use without humidification while in the hospital.  Eyes: Negative.   Respiratory: Positive for cough and shortness of breath. Negative for wheezing.   Cardiovascular: Negative for chest pain and leg swelling.  Gastrointestinal: Negative for nausea, vomiting, abdominal pain, constipation and blood in stool.  Genitourinary: Negative for dysuria,  hematuria and flank pain.  Musculoskeletal:       Knees occassionally painful 2/2 arthritis  Skin: Negative for pallor and rash.  Neurological: Positive for weakness. Negative for dizziness, syncope, light-headedness, numbness and headaches.  Hematological:       On Xarelto  Psychiatric/Behavioral: Negative for hallucinations, behavioral problems, confusion and agitation. The patient is not nervous/anxious.     BP 125/88  Pulse 90  Temp(Src) 97.8 F (36.6 C)  Resp 18  SpO2 94%     Objective:   Physical Exam  Constitutional: He is oriented to person, place, and time. He appears well-developed and well-nourished. No distress.  Recently discharged from hospital [facility] He is weak appearing, but stable.  HENT:  Head: Normocephalic and atraumatic.  Nasal mucosa with increased dryness. No bleeding noted.  Eyes: Conjunctivae are normal.  Neck: Normal range of motion. Neck supple. No tracheal deviation present.  Cardiovascular: Normal rate, regular rhythm, normal heart sounds and intact distal pulses.  Exam reveals no gallop.   No murmur heard. Pulmonary/Chest:  Shortness of breath with exertion. Rhonchi heard posteriorly on left. Cleared with cough. There was no wheezing heard or rales. Recent hospitalization for bilateral emboli.  Abdominal: Soft. Bowel sounds are normal. He exhibits no distension. There is no tenderness.  Musculoskeletal: Normal range of motion. He exhibits no edema.  Trace edema LLE. He has bilateral TED hose in use.  Lymphadenopathy:    He has no cervical adenopathy.  Neurological: He is alert and oriented to person, place, and time. No cranial nerve deficit.  Skin: Skin is warm and dry.  No rash noted. No erythema.  Psychiatric: He has a normal mood and affect. His behavior is normal. Judgment and thought content normal.           Assessment & Plan:

## 2012-11-24 NOTE — Patient Instructions (Addendum)
Keep taking the Xarelto Keep taking the Spiriva You can stop taking the Advair Follow up with Dr. Mariah Milling We will have you get another CT chest in three months to evaluate the pulmonary nodule We will see you back in 4-6 weeks.

## 2012-11-24 NOTE — Assessment & Plan Note (Addendum)
The most likely explanation for the large, bilateral pulmonary emboli Seth Perez experienced in February 2014 is his hospitalization which occurred 2 weeks beforehand for pneumonia and COPD. He states that he has had years of leg swelling but was never thoroughly evaluated for this so I question whether or not there could have been some preceding clot issues. At this point given the obvious risk factor it is reasonable to consider this a provoked pulmonary embolism and treat him for 6 months.  Plan: -Continues Xarelto for 6 months -Prior to discontinuing Xarelto, check lower ext Doppler ultrasound and repeat D-dimer -d-dimer today

## 2012-11-24 NOTE — Assessment & Plan Note (Signed)
COPD: GOLD Grade C, Stage 3 Combined recommendations from the [employer], [employer], Designer, television/film set, European Respiratory Society (Qaseem A et al, Ann Intern Med. 2011;155(3):179) recommends tobacco cessation, pulmonary rehab (for symptomatic patients with an FEV1 < 50% predicted), supplemental oxygen (for patients with SaO2 <88% or paO2 <55), and appropriate bronchodilator therapy.  In regards to long acting bronchodilators, they recommend monotherapy (FEV1 60-80% with symptoms weak evidence, FEV1 with symptoms <60% strong evidence), or combination therapy (FEV1 <60% with symptoms, strong recommendation, moderate evidence).  One should also provide patients with annual immunizations and consider therapy for prevention of COPD exacerbations (ie. roflumilast or azithromycin) when appopriate.  -O2 therapy: not indicated -Immunizations:  Pneumonia 2008, Flu 2014 -Tobacco use: quit 2014 -Exercise: can start pulm rehab after cleared by Dr. Mariah Milling -Bronchodilator therapy: Stop advair, no real need for this given moderate obstruction; continue spiriva and albuterol -Exacerbation prevention: spiriva

## 2012-11-24 NOTE — Telephone Encounter (Signed)
Form for FMLA filled out and patients son will be returning to pick it up

## 2012-11-24 NOTE — Assessment & Plan Note (Signed)
I suspect this finding is related to the pneumonia he experienced in January 2014. It needs to be followed given his heavy prior smoking history.  Plan: -Repeat CT chest without contrast May 2014

## 2012-11-25 ENCOUNTER — Other Ambulatory Visit: Payer: Self-pay | Admitting: Adult Health

## 2012-11-25 ENCOUNTER — Telehealth: Payer: Self-pay | Admitting: *Deleted

## 2012-11-25 LAB — D-DIMER, QUANTITATIVE: D-Dimer, Quant: 5.35 ug/mL-FEU — ABNORMAL HIGH (ref 0.00–0.48)

## 2012-11-25 NOTE — Telephone Encounter (Signed)
[facility] called stating pt has an alert D-dimer is 5.35

## 2012-11-25 NOTE — Telephone Encounter (Signed)
Called patient to notify him about his d-dimer results patient didn't answer will try again later

## 2012-11-25 NOTE — Telephone Encounter (Signed)
Noted.  This is not surprising.  Please let the patient know that this finding is expected due to his blood clot.  We drew the lab as a reference for when we decide to stop his Xarelto.

## 2012-11-26 ENCOUNTER — Ambulatory Visit: Payer: Medicare Other | Admitting: Adult Health

## 2012-12-02 ENCOUNTER — Ambulatory Visit (INDEPENDENT_AMBULATORY_CARE_PROVIDER_SITE_OTHER): Payer: Medicare Other | Admitting: Cardiovascular Disease

## 2012-12-02 ENCOUNTER — Encounter: Payer: Self-pay | Admitting: Cardiovascular Disease

## 2012-12-02 ENCOUNTER — Ambulatory Visit: Payer: Medicare Other | Admitting: Adult Health

## 2012-12-02 VITALS — BP 113/76 | HR 90 | Ht 75.0 in | Wt 231.5 lb

## 2012-12-02 DIAGNOSIS — I2699 Other pulmonary embolism without acute cor pulmonale: Secondary | ICD-10-CM

## 2012-12-02 DIAGNOSIS — R911 Solitary pulmonary nodule: Secondary | ICD-10-CM

## 2012-12-02 DIAGNOSIS — J9601 Acute respiratory failure with hypoxia: Secondary | ICD-10-CM

## 2012-12-02 DIAGNOSIS — J9600 Acute respiratory failure, unspecified whether with hypoxia or hypercapnia: Secondary | ICD-10-CM

## 2012-12-02 NOTE — Patient Instructions (Addendum)
You are doing well. Please decrease the xarelto to 20 mg daily on March 19 th  Please call us if you have new issues that need to be addressed before your next appt.  Your physician wants you to follow-up in: 5 months.  You will receive a reminder letter in the mail two months in advance. If you don't receive a letter, please call our office to schedule the follow-up appointment.

## 2012-12-02 NOTE — Assessment & Plan Note (Signed)
We'll continue on anticoagulation for an additional 5 months. At that time, we will see him back in the office

## 2012-12-02 NOTE — Assessment & Plan Note (Signed)
Repeat CT scan scheduled in May 2014 under the guidance of Dr. Kendrick Fries

## 2012-12-02 NOTE — Progress Notes (Signed)
Patient ID: Seth Perez, male    DOB: 25-Feb-1941, 72 y.o.   MRN: [id number]  HPI Comments:  Seth Perez is a 72 year old gentleman who is a long time smoker, quit recently after discharge from the hospital February 2013 for acute respiratory failure, COPD flare and bronchitis who read presented to the hospital with tachycardia, vomiting, hypoxia. He was diagnosed with bilateral PE and started on xarelto 50 mg twice a day. He presents to the office to establish care.  Overall he reports that he is doing well. He denies any lower extremity edema. This was a chronic problem for  up to 2 years. He's not taking Lasix. He is walking more, breathing has improved. He did have evaluation by Dr. Kendrick Fries of pulmonary and had PFTs. Currently on Advair but this is not prescribed to continue.  Overall he feels much better from when he was in the hospital. Breathing has improved, still not back to normal. Elevated cardiac enzymes in the hospital felt secondary to PE. Recent d-dimer greater than 5  Cholesterol in the hospital was 151, LDL 111, HDL 26  Echocardiogram in the hospital showed ejection fraction greater than 55%, diastolic dysfunction, normal right ventricular systolic pressures  EKG today shows normal sinus rhythm with rate 90 beats per minute, rare APC, poor progression through the anterior precordial leads   Outpatient Encounter Prescriptions as of 12/02/2012  Medication Sig Dispense Refill  . albuterol (VENTOLIN HFA) 108 (90 BASE) MCG/ACT inhaler Inhale 2 puffs into the lungs every 6 (six) hours as needed for wheezing.      . Fluticasone-Salmeterol (ADVAIR) 250-50 MCG/DOSE AEPB Inhale 1 puff into the lungs every 12 (twelve) hours.  60 each  5  . furosemide (LASIX) 20 MG tablet Take 1/2 tablet in the morning as needed for leg swelling.  30 tablet  3  . Rivaroxaban (XARELTO) 15 MG TABS tablet Take 15 mg by mouth 2 (two) times daily. Take this dose and schedule until 12/09/12 which you will  start Xarelto 20 mg once a day in the evening.      . tiotropium (SPIRIVA) 18 MCG inhalation capsule Place 1 capsule (18 mcg total) into inhaler and inhale daily.  30 capsule  5  . [DISCONTINUED] predniSONE (STERAPRED UNI-PAK) 10 MG tablet Tapered dose as directed       No facility-administered encounter medications on file as of 12/02/2012.     Review of Systems  Constitutional: Negative.   HENT: Negative.   Eyes: Negative.   Respiratory: Positive for shortness of breath.   Cardiovascular: Negative.   Gastrointestinal: Negative.   Musculoskeletal: Negative.   Skin: Negative.   Neurological: Negative.   Psychiatric/Behavioral: Negative.   All other systems reviewed and are negative.    BP 113/76  Pulse 90  Ht 6\' 3"  (1.905 m)  Wt 231 lb 8 oz (105.008 kg)  BMI 28.94 kg/m2  Physical Exam  Nursing note and vitals reviewed. Constitutional: He is oriented to person, place, and time. He appears well-developed and well-nourished.  HENT:  Head: Normocephalic.  Nose: Nose normal.  Mouth/Throat: Oropharynx is clear and moist.  Eyes: Conjunctivae are normal. Pupils are equal, round, and reactive to light.  Neck: Normal range of motion. Neck supple. No JVD present.  Cardiovascular: Normal rate, regular rhythm, S1 normal, S2 normal, normal heart sounds and intact distal pulses.  Exam reveals no gallop and no friction rub.   No murmur heard. Pulmonary/Chest: Effort normal and breath sounds normal. No respiratory distress. He has  no wheezes. He has no rales. He exhibits no tenderness.  Abdominal: Soft. Bowel sounds are normal. He exhibits no distension. There is no tenderness.  Musculoskeletal: Normal range of motion. He exhibits no edema and no tenderness.  Lymphadenopathy:    He has no cervical adenopathy.  Neurological: He is alert and oriented to person, place, and time. Coordination normal.  Skin: Skin is warm and dry. No rash noted. No erythema.  Psychiatric: He has a normal mood  and affect. His behavior is normal. Judgment and thought content normal.      Assessment and Plan

## 2012-12-02 NOTE — Assessment & Plan Note (Signed)
Long history of smoking, COPD. We have encouraged him to call pulmonary or primary care if he develops symptoms consistent with COPD flare or bronchitis

## 2012-12-08 ENCOUNTER — Ambulatory Visit: Payer: Medicare Other | Admitting: Pulmonary Disease

## 2012-12-14 DIAGNOSIS — R3129 Other microscopic hematuria: Secondary | ICD-10-CM | POA: Diagnosis not present

## 2012-12-14 DIAGNOSIS — N401 Enlarged prostate with lower urinary tract symptoms: Secondary | ICD-10-CM | POA: Diagnosis not present

## 2012-12-14 DIAGNOSIS — R972 Elevated prostate specific antigen [PSA]: Secondary | ICD-10-CM | POA: Diagnosis not present

## 2012-12-14 DIAGNOSIS — R339 Retention of urine, unspecified: Secondary | ICD-10-CM | POA: Diagnosis not present

## 2012-12-16 ENCOUNTER — Other Ambulatory Visit: Payer: Self-pay | Admitting: Internal Medicine

## 2012-12-21 DIAGNOSIS — Z09 Encounter for follow-up examination after completed treatment for conditions other than malignant neoplasm: Secondary | ICD-10-CM | POA: Diagnosis not present

## 2013-01-06 DIAGNOSIS — N401 Enlarged prostate with lower urinary tract symptoms: Secondary | ICD-10-CM | POA: Insufficient documentation

## 2013-01-06 DIAGNOSIS — R3129 Other microscopic hematuria: Secondary | ICD-10-CM | POA: Insufficient documentation

## 2013-01-06 DIAGNOSIS — R339 Retention of urine, unspecified: Secondary | ICD-10-CM | POA: Insufficient documentation

## 2013-01-06 DIAGNOSIS — R972 Elevated prostate specific antigen [PSA]: Secondary | ICD-10-CM | POA: Insufficient documentation

## 2013-01-07 DIAGNOSIS — R972 Elevated prostate specific antigen [PSA]: Secondary | ICD-10-CM | POA: Diagnosis not present

## 2013-01-26 ENCOUNTER — Ambulatory Visit (INDEPENDENT_AMBULATORY_CARE_PROVIDER_SITE_OTHER): Payer: Medicare Other | Admitting: Pulmonary Disease

## 2013-01-26 ENCOUNTER — Encounter: Payer: Self-pay | Admitting: Pulmonary Disease

## 2013-01-26 VITALS — BP 100/70 | HR 101 | Temp 98.0°F | Ht 75.0 in | Wt 245.0 lb

## 2013-01-26 DIAGNOSIS — Z86718 Personal history of other venous thrombosis and embolism: Secondary | ICD-10-CM

## 2013-01-26 DIAGNOSIS — I2699 Other pulmonary embolism without acute cor pulmonale: Secondary | ICD-10-CM | POA: Diagnosis not present

## 2013-01-26 DIAGNOSIS — R911 Solitary pulmonary nodule: Secondary | ICD-10-CM

## 2013-01-26 DIAGNOSIS — J438 Other emphysema: Secondary | ICD-10-CM

## 2013-01-26 DIAGNOSIS — J439 Emphysema, unspecified: Secondary | ICD-10-CM

## 2013-01-26 MED ORDER — RIVAROXABAN 20 MG PO TABS: 20.0000 mg | ORAL_TABLET | Freq: Every day | ORAL | Status: DC

## 2013-01-26 NOTE — Patient Instructions (Addendum)
We will call you with the results of your CT  We will see you back in late July or early August

## 2013-01-26 NOTE — Assessment & Plan Note (Signed)
Provoked pulmonary embolism, currently asymptomatic.   Plan: -Continue Xarelto through late July 2014. -Check d-dimer and lower extremity Doppler ultrasound in July 2014

## 2013-01-26 NOTE — Assessment & Plan Note (Signed)
We will order a CT scan of his chest without contrast for this month to followup this lesion. It has an atypical appearance and I question whether or not it was related to a chest infection during his January through February lengthy hospitalization with a respiratory illness.

## 2013-01-26 NOTE — Assessment & Plan Note (Signed)
He continues to do well with Spiriva alone.  Plan: -Spiriva -Exercise as much as possible -Annual flu shot

## 2013-01-26 NOTE — Progress Notes (Signed)
Subjective:    Patient ID: Seth Perez, male    DOB: May 14, 1941, 72 y.o.   MRN: [id number]  Synopsis: Seth Perez first saw the [facility] pulmonary clinic in March of 2014 for evaluation of pulmonary embolism, a pulmonary nodule and COPD. He had moderate obstruction on pulmonary function testing and was prescribed Spiriva. He had a provoked pulmonary embolism in late February 2014 and was treated for this with Xarelto. He also had a 1.2 cm pleural-based nodule in the right upper lobe.  HPI  01/26/2013 routine office visit>> Seth Perez has been doing well since her last visit. He has not had shortness of breath, cough, wheezing, or chest pain. He continues to have some mild lower extremity leg swelling which has not changed since her last visit. He had some nosebleeds on Xarelto but they were very mild and self-limited. He has been doing well with the Spiriva and continues to take it every day.   Past Medical History  Diagnosis Date  . Arthritis   . Chicken pox   . COPD (chronic obstructive pulmonary disease)   . Pulmonary emboli     on Xarelto     Review of Systems  Constitutional: Negative for fever, chills and fatigue.  HENT: Negative for sneezing, postnasal drip and sinus pressure.   Respiratory: Negative for cough, shortness of breath and wheezing.   Cardiovascular: Positive for leg swelling. Negative for chest pain and palpitations.       Objective:   Physical Exam  Filed Vitals:   01/26/13 1643  BP: 100/70  Pulse: 101  Temp: 98 F (36.7 C)  TempSrc: Oral  Height: 6\' 3"  (1.905 m)  Weight: 245 lb (111.131 kg)  SpO2: 94%  RA  Gen: well appearing, no acute distress HEENT: NCAT, EOMi, OP clear, PULM: CTA B CV: RRR, no mgr, no JVD AB: BS+, soft, nontender, no hsm Ext: warm, trace bilateral leg edema, no clubbing, no cyanosis      Assessment & Plan:   Acute pulmonary embolism Provoked pulmonary embolism, currently asymptomatic.   Plan: -Continue  Xarelto through late July 2014. -Check d-dimer and lower extremity Doppler ultrasound in July 2014  COPD (chronic obstructive pulmonary disease) with emphysema He continues to do well with Spiriva alone.  Plan: -Spiriva -Exercise as much as possible -Annual flu shot  Solitary pulmonary nodule We will order a CT scan of his chest without contrast for this month to followup this lesion. It has an atypical appearance and I question whether or not it was related to a chest infection during his January through February lengthy hospitalization with a respiratory illness.   Updated Medication List Outpatient Encounter Prescriptions as of 01/26/2013  Medication Sig Dispense Refill  . albuterol (VENTOLIN HFA) 108 (90 BASE) MCG/ACT inhaler Inhale 2 puffs into the lungs every 6 (six) hours as needed for wheezing.      . Fluticasone-Salmeterol (ADVAIR) 250-50 MCG/DOSE AEPB Inhale 1 puff into the lungs every 12 (twelve) hours.  60 each  5  . furosemide (LASIX) 20 MG tablet Take 1/2 tablet in the morning as needed for leg swelling.  30 tablet  3  . Rivaroxaban (XARELTO) 20 MG TABS Take 1 tablet (20 mg total) by mouth daily.  30 tablet  3  . SPIRIVA HANDIHALER 18 MCG inhalation capsule INHALE 1 CAPSULE VIA HANDIHALER ONCE DAILY AT THE SAME TIME EVERY DAY  30 capsule  5  . tamsulosin (FLOMAX) 0.4 MG CAPS Take 0.4 mg by mouth daily.      . [  DISCONTINUED] Rivaroxaban (XARELTO) 20 MG TABS Take 20 mg by mouth daily.      . [DISCONTINUED] Rivaroxaban (XARELTO) 15 MG TABS tablet Take 15 mg by mouth 2 (two) times daily. Take this dose and schedule until 12/09/12 which you will start Xarelto 20 mg once a day in the evening.       No facility-administered encounter medications on file as of 01/26/2013.

## 2013-02-02 ENCOUNTER — Ambulatory Visit: Payer: Self-pay | Admitting: Pulmonary Disease

## 2013-02-02 DIAGNOSIS — J984 Other disorders of lung: Secondary | ICD-10-CM | POA: Diagnosis not present

## 2013-02-02 DIAGNOSIS — R911 Solitary pulmonary nodule: Secondary | ICD-10-CM | POA: Diagnosis not present

## 2013-02-02 DIAGNOSIS — R918 Other nonspecific abnormal finding of lung field: Secondary | ICD-10-CM | POA: Diagnosis not present

## 2013-02-10 ENCOUNTER — Encounter: Payer: Self-pay | Admitting: Pulmonary Disease

## 2013-02-11 ENCOUNTER — Telehealth: Payer: Self-pay | Admitting: *Deleted

## 2013-02-11 NOTE — Telephone Encounter (Signed)
Message copied by Christen Butter on Thu Feb 11, 2013  3:47 PM ------      Message from: Max Fickle B      Created: Wed Feb 10, 2013  8:49 PM       L,            Please let him know that his Chest CT was OK            Thanks      B ------

## 2013-02-11 NOTE — Telephone Encounter (Signed)
Spoke with pt and notified of results per Dr.McQuaid. Pt verbalized understanding and denied any questions. 

## 2013-03-01 ENCOUNTER — Encounter: Payer: Self-pay | Admitting: Pulmonary Disease

## 2013-03-16 DIAGNOSIS — R05 Cough: Secondary | ICD-10-CM | POA: Diagnosis not present

## 2013-03-16 DIAGNOSIS — Z1211 Encounter for screening for malignant neoplasm of colon: Secondary | ICD-10-CM | POA: Diagnosis not present

## 2013-03-16 DIAGNOSIS — R059 Cough, unspecified: Secondary | ICD-10-CM | POA: Diagnosis not present

## 2013-03-16 DIAGNOSIS — Z7901 Long term (current) use of anticoagulants: Secondary | ICD-10-CM | POA: Diagnosis not present

## 2013-03-23 ENCOUNTER — Telehealth: Payer: Self-pay

## 2013-03-23 NOTE — Telephone Encounter (Signed)
[facility] requesting cardiac clearance for pt to have colonoscopy Is on Xarelto

## 2013-03-24 DIAGNOSIS — N401 Enlarged prostate with lower urinary tract symptoms: Secondary | ICD-10-CM | POA: Diagnosis not present

## 2013-03-24 DIAGNOSIS — R339 Retention of urine, unspecified: Secondary | ICD-10-CM | POA: Diagnosis not present

## 2013-03-24 DIAGNOSIS — R972 Elevated prostate specific antigen [PSA]: Secondary | ICD-10-CM | POA: Diagnosis not present

## 2013-03-24 DIAGNOSIS — R3129 Other microscopic hematuria: Secondary | ICD-10-CM | POA: Diagnosis not present

## 2013-03-24 NOTE — Telephone Encounter (Signed)
He can stop the anticoagulation medication 2 days before the procedure, restart 24 hours after the procedure (would confirm with GI Dr. that it is okay to restart anticoagulation)

## 2013-03-24 NOTE — Telephone Encounter (Signed)
Letter faxed.

## 2013-04-07 ENCOUNTER — Ambulatory Visit: Payer: Self-pay | Admitting: Pulmonary Disease

## 2013-04-07 DIAGNOSIS — Z86718 Personal history of other venous thrombosis and embolism: Secondary | ICD-10-CM | POA: Diagnosis not present

## 2013-04-07 DIAGNOSIS — Z09 Encounter for follow-up examination after completed treatment for conditions other than malignant neoplasm: Secondary | ICD-10-CM | POA: Diagnosis not present

## 2013-04-20 ENCOUNTER — Telehealth: Payer: Self-pay | Admitting: *Deleted

## 2013-04-20 ENCOUNTER — Encounter: Payer: Self-pay | Admitting: Pulmonary Disease

## 2013-04-20 NOTE — Telephone Encounter (Signed)
Message copied by Christen Butter on Tue Apr 20, 2013  9:20 AM ------      Message from: Max Fickle B      Created: Tue Apr 20, 2013  8:55 AM       L,            Can we let him know that his LE doppler ultrasound was negative for DVT?  We should see him in clinic in the next few weeks.  He doesn't have an appointment as best I can tell.            Thanks,      B ------

## 2013-04-20 NOTE — Telephone Encounter (Signed)
LMTCB for the pt 

## 2013-04-22 NOTE — Telephone Encounter (Signed)
Returning call can be reached at [phone].Raylene Everts

## 2013-04-22 NOTE — Telephone Encounter (Signed)
I spoke with patient about results and he verbalized understanding and had no questions appt scheduled 

## 2013-05-04 ENCOUNTER — Ambulatory Visit (INDEPENDENT_AMBULATORY_CARE_PROVIDER_SITE_OTHER): Payer: Medicare Other | Admitting: Pulmonary Disease

## 2013-05-04 ENCOUNTER — Other Ambulatory Visit: Payer: Self-pay | Admitting: Pulmonary Disease

## 2013-05-04 ENCOUNTER — Encounter: Payer: Self-pay | Admitting: Pulmonary Disease

## 2013-05-04 VITALS — BP 94/60 | HR 68 | Temp 98.4°F | Ht 75.0 in | Wt 233.0 lb

## 2013-05-04 DIAGNOSIS — J438 Other emphysema: Secondary | ICD-10-CM | POA: Diagnosis not present

## 2013-05-04 DIAGNOSIS — I2699 Other pulmonary embolism without acute cor pulmonale: Secondary | ICD-10-CM

## 2013-05-04 DIAGNOSIS — R911 Solitary pulmonary nodule: Secondary | ICD-10-CM | POA: Diagnosis not present

## 2013-05-04 DIAGNOSIS — J439 Emphysema, unspecified: Secondary | ICD-10-CM

## 2013-05-04 NOTE — Patient Instructions (Signed)
Stop taking the Xarelto Keep taking the Spiriva I recommend that you get compression stockings in the 20-30 pressure range; hip height We will schedule a CT scan of your chest at [facility] in 07/2013 to follow the nodule  We will see you after the CT in November

## 2013-05-04 NOTE — Progress Notes (Signed)
Subjective:    Patient ID: Seth Perez, male    DOB: Jan 11, 1941, 72 y.o.   MRN: [id number]  Synopsis: Seth Perez first saw the [facility] pulmonary clinic in March of 2014 for evaluation of pulmonary embolism, a pulmonary nodule and COPD. He had moderate obstruction on pulmonary function testing and was prescribed Spiriva. He had a provoked pulmonary embolism in late February 2014 and was treated for this with Xarelto. He also had a 1.2 cm pleural-based nodule in the right upper lobe.  HPI   01/26/2013 routine office visit>> Seth Perez has been doing well since her last visit. He has not had shortness of breath, cough, wheezing, or chest pain. He continues to have some mild lower extremity leg swelling which has not changed since her last visit. He had some nosebleeds on Xarelto but they were very mild and self-limited. He has been doing well with the Spiriva and continues to take it every day.  05/04/2013 ROV >> Seth Perez has been doing very well since the last visit. He is frustrated by cost of his medications. He stopped taking the Advair. His leg swelling has been about the same since the last visit. He has not started using compression stockings because of the cost. He does not have problems with shortness of breath or cough. He has not had significant bleeding on his anticoagulation therapy.   Past Medical History  Diagnosis Date  . Arthritis   . Chicken pox   . COPD (chronic obstructive pulmonary disease)   . Pulmonary emboli     on Xarelto     Review of Systems  Constitutional: Negative for fever, chills and fatigue.  HENT: Negative for sneezing, postnasal drip and sinus pressure.   Respiratory: Negative for cough, shortness of breath and wheezing.   Cardiovascular: Positive for leg swelling. Negative for chest pain and palpitations.       Objective:   Physical Exam   Filed Vitals:   05/04/13 1225  BP: 94/60  Pulse: 68  Temp: 98.4 F (36.9 C)  TempSrc: Oral   Height: 6\' 3"  (1.905 m)  Weight: 233 lb (105.688 kg)  SpO2: 95%  RA  Gen: well appearing, no acute distress HEENT: NCAT, EOMi, OP clear, PULM: CTA B CV: RRR, no mgr, no JVD AB: BS+, soft, nontender, no hsm Ext: warm, chronic venous stasis changes bilaterally, no clubbing, no cyanosis      Assessment & Plan:   Acute pulmonary embolism He has completed 6 months of anticoagulation for his provoked pulmonary embolism. His most recent ultrasound showed no evidence of blood clot  Plan: -Stop anticoagulation  COPD (chronic obstructive pulmonary disease) with emphysema This is been a stable interval for Seth Perez.  Plan: -Continue Spiriva alone  Solitary pulmonary nodule I believe that these very small nodules were likely related to a prior infection. There was no growth seen on the May 2014 study.  Plan: -CT chest November 2014 to followup nodules    Updated Medication List Outpatient Encounter Prescriptions as of 05/04/2013  Medication Sig Dispense Refill  . albuterol (VENTOLIN HFA) 108 (90 BASE) MCG/ACT inhaler Inhale 2 puffs into the lungs every 6 (six) hours as needed for wheezing.      . furosemide (LASIX) 20 MG tablet Take 1/2 tablet in the morning as needed for leg swelling.  30 tablet  3  . Rivaroxaban (XARELTO) 20 MG TABS Take 1 tablet (20 mg total) by mouth daily.  30 tablet  3  . SPIRIVA HANDIHALER 18 MCG inhalation  capsule INHALE 1 CAPSULE VIA HANDIHALER ONCE DAILY AT THE SAME TIME EVERY DAY  30 capsule  5  . tamsulosin (FLOMAX) 0.4 MG CAPS Take 0.4 mg by mouth daily.      . [DISCONTINUED] Fluticasone-Salmeterol (ADVAIR) 250-50 MCG/DOSE AEPB Inhale 1 puff into the lungs every 12 (twelve) hours.  60 each  5   No facility-administered encounter medications on file as of 05/04/2013.

## 2013-05-04 NOTE — Assessment & Plan Note (Signed)
This is been a stable interval for Rondrick.  Plan: -Continue Spiriva alone

## 2013-05-04 NOTE — Assessment & Plan Note (Signed)
He has completed 6 months of anticoagulation for his provoked pulmonary embolism. His most recent ultrasound showed no evidence of blood clot  Plan: -Stop anticoagulation

## 2013-05-04 NOTE — Assessment & Plan Note (Signed)
I believe that these very small nodules were likely related to a prior infection. There was no growth seen on the May 2014 study.  Plan: -CT chest November 2014 to followup nodules

## 2013-06-17 LAB — HM COLONOSCOPY

## 2013-07-27 ENCOUNTER — Ambulatory Visit: Payer: Self-pay | Admitting: Pulmonary Disease

## 2013-07-27 DIAGNOSIS — R911 Solitary pulmonary nodule: Secondary | ICD-10-CM | POA: Diagnosis not present

## 2013-07-27 DIAGNOSIS — J984 Other disorders of lung: Secondary | ICD-10-CM | POA: Diagnosis not present

## 2013-07-27 DIAGNOSIS — I2510 Atherosclerotic heart disease of native coronary artery without angina pectoris: Secondary | ICD-10-CM | POA: Diagnosis not present

## 2013-07-29 ENCOUNTER — Telehealth: Payer: Self-pay

## 2013-07-29 ENCOUNTER — Encounter: Payer: Self-pay | Admitting: Pulmonary Disease

## 2013-07-29 DIAGNOSIS — R918 Other nonspecific abnormal finding of lung field: Secondary | ICD-10-CM

## 2013-07-29 NOTE — Telephone Encounter (Signed)
Pt aware of info.  Placed order for CT w/o in 6 mos at [facility]. Seth Perez L

## 2013-07-29 NOTE — Telephone Encounter (Signed)
Message copied by Velvet Bathe on Thu Jul 29, 2013  1:50 PM ------      Message from: Max Fickle B      Created: Thu Jul 29, 2013  1:26 PM       A,            Please let him know that his pulmonary nodules have not changed, this is good.            Our next CT needs to be 6 months from now            Thanks,      B ------

## 2013-08-06 ENCOUNTER — Ambulatory Visit: Payer: Self-pay | Admitting: Unknown Physician Specialty

## 2013-08-06 DIAGNOSIS — K620 Anal polyp: Secondary | ICD-10-CM | POA: Diagnosis not present

## 2013-08-06 DIAGNOSIS — Z7902 Long term (current) use of antithrombotics/antiplatelets: Secondary | ICD-10-CM | POA: Diagnosis not present

## 2013-08-06 DIAGNOSIS — K5730 Diverticulosis of large intestine without perforation or abscess without bleeding: Secondary | ICD-10-CM | POA: Diagnosis not present

## 2013-08-06 DIAGNOSIS — R918 Other nonspecific abnormal finding of lung field: Secondary | ICD-10-CM | POA: Diagnosis not present

## 2013-08-06 DIAGNOSIS — J449 Chronic obstructive pulmonary disease, unspecified: Secondary | ICD-10-CM | POA: Diagnosis not present

## 2013-08-06 DIAGNOSIS — D126 Benign neoplasm of colon, unspecified: Secondary | ICD-10-CM | POA: Diagnosis not present

## 2013-08-06 DIAGNOSIS — K621 Rectal polyp: Secondary | ICD-10-CM | POA: Diagnosis not present

## 2013-08-06 DIAGNOSIS — I2510 Atherosclerotic heart disease of native coronary artery without angina pectoris: Secondary | ICD-10-CM | POA: Diagnosis not present

## 2013-08-06 DIAGNOSIS — Z1211 Encounter for screening for malignant neoplasm of colon: Secondary | ICD-10-CM | POA: Diagnosis not present

## 2013-08-06 DIAGNOSIS — Z87891 Personal history of nicotine dependence: Secondary | ICD-10-CM | POA: Diagnosis not present

## 2013-08-06 DIAGNOSIS — K648 Other hemorrhoids: Secondary | ICD-10-CM | POA: Diagnosis not present

## 2013-08-06 DIAGNOSIS — Z86711 Personal history of pulmonary embolism: Secondary | ICD-10-CM | POA: Diagnosis not present

## 2013-08-06 DIAGNOSIS — Z801 Family history of malignant neoplasm of trachea, bronchus and lung: Secondary | ICD-10-CM | POA: Diagnosis not present

## 2013-08-11 ENCOUNTER — Encounter: Payer: Self-pay | Admitting: Pulmonary Disease

## 2013-08-17 LAB — HM COLONOSCOPY

## 2013-09-01 ENCOUNTER — Other Ambulatory Visit: Payer: Self-pay | Admitting: Internal Medicine

## 2013-12-06 ENCOUNTER — Encounter (INDEPENDENT_AMBULATORY_CARE_PROVIDER_SITE_OTHER): Payer: Self-pay

## 2013-12-06 ENCOUNTER — Telehealth: Payer: Self-pay | Admitting: Adult Health

## 2013-12-06 ENCOUNTER — Ambulatory Visit (INDEPENDENT_AMBULATORY_CARE_PROVIDER_SITE_OTHER): Payer: Medicare Other | Admitting: Adult Health

## 2013-12-06 ENCOUNTER — Encounter: Payer: Self-pay | Admitting: Adult Health

## 2013-12-06 VITALS — BP 106/66 | HR 75 | Temp 97.9°F | Resp 16 | Wt 268.5 lb

## 2013-12-06 DIAGNOSIS — R609 Edema, unspecified: Secondary | ICD-10-CM

## 2013-12-06 DIAGNOSIS — R600 Localized edema: Secondary | ICD-10-CM

## 2013-12-06 MED ORDER — FUROSEMIDE 20 MG PO TABS: ORAL_TABLET | ORAL | Status: DC

## 2013-12-06 NOTE — Progress Notes (Signed)
Patient ID: Seth Perez, male   DOB: 08-12-1941, 73 y.o.   MRN: [id number]    Subjective:    Patient ID: Seth Perez, male    DOB: 13-Aug-1941, 73 y.o.   MRN: [id number]  HPI  Pt is a pleasant 73 y/o male who called today to report that both his legs were swollen. He has a hx of LE edema but he reported that they were more edematous than usual. He was worked in to be evaluated. He denies pain in his LE. He denies shortness of breath. He has not taken his lasix secondary to low b/p. Pt is also on flomax and reports that when he takes both medications his blood pressure drops to the point that he feels lightheaded. Today in clinic his b/p is 106/66 and he has not taken the lasix.   Past Medical History  Diagnosis Date  . Arthritis   . Chicken pox   . COPD (chronic obstructive pulmonary disease)   . Pulmonary emboli     on Xarelto    Current Outpatient Prescriptions on File Prior to Visit  Medication Sig Dispense Refill  . albuterol (VENTOLIN HFA) 108 (90 BASE) MCG/ACT inhaler Inhale 2 puffs into the lungs every 6 (six) hours as needed for wheezing.      Marland Kitchen SPIRIVA HANDIHALER 18 MCG inhalation capsule INHALE 1 CAPSULE VIA HANDIHALER ONCE DAILY AT THE SAME TIME EVERY DAY  30 capsule  2  . tamsulosin (FLOMAX) 0.4 MG CAPS Take 0.4 mg by mouth daily.       No current facility-administered medications on file prior to visit.     Review of Systems  Constitutional: Negative for fatigue.  Respiratory: Negative for cough, chest tightness, shortness of breath and wheezing.   Cardiovascular: Positive for leg swelling. Negative for chest pain and palpitations.  Neurological: Light-headedness: when he takes flomax and lasix.  Psychiatric/Behavioral: Negative.   All other systems reviewed and are negative.       Objective:  BP 106/66  Pulse 75  Temp(Src) 97.9 F (36.6 C) (Oral)  Resp 16  Wt 268 lb 8 oz (121.791 kg)  SpO2 95%   Physical Exam  Constitutional: He is oriented to  person, place, and time. No distress.  Cardiovascular: Normal rate, regular rhythm and normal heart sounds.  Exam reveals no gallop.   No murmur heard. Pulmonary/Chest: Effort normal and breath sounds normal. No respiratory distress. He has no wheezes. He has no rales.  Musculoskeletal: Normal range of motion. He exhibits edema. He exhibits no tenderness.  Neurological: He is alert and oriented to person, place, and time.  Skin:  LE skin is taut, shinny. No wounds noted.  Psychiatric: He has a normal mood and affect. His behavior is normal. Judgment and thought content normal.        Assessment & Plan:   1. Bilateral leg edema Suspect venous insufficiency although question whether there is some PVD. It is a challenge to maintain his blood pressure while taking both flomax and lasix. He was on lasix 20 mg 1/2 tablet and this dropped his pressure to the point that he felt lightheaded. I have asked pt to hold his flomax for a couple of days so that he can take lasix. I am also sending him to have venous and arterial studies of lower extremities since he has not had this done previously.  Elevate LE as much as possible. Use compression stockings. He reports difficulty in putting them on but he does wear them.  I taught him a technique in placing the stockings that may be a little simpler to accomplish. He is to avoid salt.    - Lower Extremity Venous Duplex Bilateral; Future - Lower Extremity Arterial Doppler Bilateral; Future

## 2013-12-06 NOTE — Telephone Encounter (Signed)
Spoke with pt's son, pt has been taking Lasix 20 mg daily, denies any pain or shortness of breath. Appt scheduled today at 4:00 per Raquel.

## 2013-12-06 NOTE — Patient Instructions (Addendum)
  I am referring you to Dr. Donivan Scull office to have a venous and arterial study of your legs.  They will contact you with an appointment.  I will call you when the form for disability parking sticker is completed so that you can pick it up.  Keep your legs elevated as much as possible.  Hold the flomax for a few days so that you can take the lasix.   Use your compression socks as much as possible. Remove at bedtime.  Make an appointment for your Medicare Wellness (yearly physical) after you have had the leg study.

## 2013-12-06 NOTE — Progress Notes (Signed)
Pre visit review using our clinic review tool, if applicable. No additional management support is needed unless otherwise documented below in the visit note. 

## 2013-12-06 NOTE — Telephone Encounter (Signed)
Patient Information:  Caller Name: Jolly  Phone: [phone]  Patient: Seth Perez, Seth Perez  Gender: Male  DOB: 05/05/1941  Age: 73 Years  PCP: Charolette Forward  Office Follow Up:  Does the office need to follow up with this patient?: Yes  Instructions For The Office: appts are full at [place] and [facility] [place] location. Son doesn't really want to go further than that. Please call and advise if any cancellations or work-ins available or it pt should go to ED.   Symptoms  Reason For Call & Symptoms: Pts son is calling for an appt. Pts legs are edematous. He had bilateral PE last year and his legs have been swelling off and on since then but son states "nothing like this". Pt has had this swelling x 2-3 weeks. His son saw him for the first time yesterday and he couldn't believe how swollen they were - at least double with red/scaly looking skin. They are not weeping. Pt not with the son now and son not aware if the swelling goes above the knees or not. Both legs look the same.  Reviewed Health History In EMR: Yes  Reviewed Medications In EMR: Yes  Reviewed Allergies In EMR: Yes  Reviewed Surgeries / Procedures: Yes  Date of Onset of Symptoms: 11/15/2013  Guideline(s) Used:  Leg Swelling and Edema  Disposition Per Guideline:   See Today in Office  Reason For Disposition Reached:   Moderate swelling of both ankles (e.g., swelling extends up to the knees) AND new onset or worsening  Advice Given:  N/A  Patient Will Follow Care Advice:  YES

## 2013-12-08 ENCOUNTER — Ambulatory Visit: Payer: Self-pay | Admitting: Adult Health

## 2013-12-08 ENCOUNTER — Ambulatory Visit: Payer: BLUE CROSS/BLUE SHIELD | Admitting: Adult Health

## 2013-12-08 ENCOUNTER — Other Ambulatory Visit: Payer: Self-pay | Admitting: Adult Health

## 2013-12-08 ENCOUNTER — Telehealth: Payer: Self-pay | Admitting: *Deleted

## 2013-12-08 DIAGNOSIS — M7120 Synovial cyst of popliteal space [Baker], unspecified knee: Secondary | ICD-10-CM | POA: Diagnosis not present

## 2013-12-08 DIAGNOSIS — R609 Edema, unspecified: Secondary | ICD-10-CM | POA: Diagnosis not present

## 2013-12-08 NOTE — Telephone Encounter (Signed)
Call report from [facility] ultrasound: negative for DVT both legs. Baker's cyst 4.2 cm left leg. She will advise pt that office will call with any further instructions.

## 2013-12-08 NOTE — Telephone Encounter (Signed)
Pt was in the office on Monday, ok to refill?

## 2013-12-08 NOTE — Telephone Encounter (Signed)
Called and advised patient of results. Denies any pain in left knee/behind left knee.

## 2014-01-04 ENCOUNTER — Encounter: Payer: Self-pay | Admitting: Adult Health

## 2014-01-13 DIAGNOSIS — R3129 Other microscopic hematuria: Secondary | ICD-10-CM | POA: Diagnosis not present

## 2014-01-13 DIAGNOSIS — N401 Enlarged prostate with lower urinary tract symptoms: Secondary | ICD-10-CM | POA: Diagnosis not present

## 2014-01-13 DIAGNOSIS — R339 Retention of urine, unspecified: Secondary | ICD-10-CM | POA: Diagnosis not present

## 2014-01-13 DIAGNOSIS — R972 Elevated prostate specific antigen [PSA]: Secondary | ICD-10-CM | POA: Diagnosis not present

## 2014-01-14 ENCOUNTER — Other Ambulatory Visit: Payer: Self-pay | Admitting: Adult Health

## 2014-01-21 ENCOUNTER — Inpatient Hospital Stay: Payer: Self-pay | Admitting: Internal Medicine

## 2014-01-21 DIAGNOSIS — R0989 Other specified symptoms and signs involving the circulatory and respiratory systems: Secondary | ICD-10-CM | POA: Diagnosis not present

## 2014-01-21 DIAGNOSIS — Z87891 Personal history of nicotine dependence: Secondary | ICD-10-CM | POA: Diagnosis not present

## 2014-01-21 DIAGNOSIS — I2789 Other specified pulmonary heart diseases: Secondary | ICD-10-CM | POA: Diagnosis not present

## 2014-01-21 DIAGNOSIS — J449 Chronic obstructive pulmonary disease, unspecified: Secondary | ICD-10-CM | POA: Diagnosis not present

## 2014-01-21 DIAGNOSIS — Z7902 Long term (current) use of antithrombotics/antiplatelets: Secondary | ICD-10-CM | POA: Diagnosis not present

## 2014-01-21 DIAGNOSIS — I498 Other specified cardiac arrhythmias: Secondary | ICD-10-CM | POA: Diagnosis present

## 2014-01-21 DIAGNOSIS — R000 Tachycardia, unspecified: Secondary | ICD-10-CM | POA: Diagnosis not present

## 2014-01-21 DIAGNOSIS — I2699 Other pulmonary embolism without acute cor pulmonale: Secondary | ICD-10-CM | POA: Insufficient documentation

## 2014-01-21 DIAGNOSIS — I499 Cardiac arrhythmia, unspecified: Secondary | ICD-10-CM | POA: Diagnosis not present

## 2014-01-21 DIAGNOSIS — R0902 Hypoxemia: Secondary | ICD-10-CM | POA: Diagnosis not present

## 2014-01-21 DIAGNOSIS — N289 Disorder of kidney and ureter, unspecified: Secondary | ICD-10-CM | POA: Diagnosis present

## 2014-01-21 DIAGNOSIS — R0609 Other forms of dyspnea: Secondary | ICD-10-CM | POA: Diagnosis not present

## 2014-01-21 DIAGNOSIS — I5043 Acute on chronic combined systolic (congestive) and diastolic (congestive) heart failure: Secondary | ICD-10-CM | POA: Insufficient documentation

## 2014-01-21 DIAGNOSIS — R0602 Shortness of breath: Secondary | ICD-10-CM | POA: Diagnosis not present

## 2014-01-21 DIAGNOSIS — I959 Hypotension, unspecified: Secondary | ICD-10-CM | POA: Diagnosis not present

## 2014-01-21 DIAGNOSIS — Z86711 Personal history of pulmonary embolism: Secondary | ICD-10-CM | POA: Diagnosis not present

## 2014-01-21 DIAGNOSIS — M1990 Unspecified osteoarthritis, unspecified site: Secondary | ICD-10-CM | POA: Diagnosis not present

## 2014-01-21 DIAGNOSIS — R799 Abnormal finding of blood chemistry, unspecified: Secondary | ICD-10-CM | POA: Diagnosis not present

## 2014-01-21 DIAGNOSIS — I4891 Unspecified atrial fibrillation: Secondary | ICD-10-CM | POA: Diagnosis not present

## 2014-01-21 DIAGNOSIS — I248 Other forms of acute ischemic heart disease: Secondary | ICD-10-CM | POA: Diagnosis present

## 2014-01-21 DIAGNOSIS — N400 Enlarged prostate without lower urinary tract symptoms: Secondary | ICD-10-CM | POA: Diagnosis present

## 2014-01-21 DIAGNOSIS — J4489 Other specified chronic obstructive pulmonary disease: Secondary | ICD-10-CM | POA: Diagnosis not present

## 2014-01-21 DIAGNOSIS — R748 Abnormal levels of other serum enzymes: Secondary | ICD-10-CM | POA: Diagnosis present

## 2014-01-21 DIAGNOSIS — F411 Generalized anxiety disorder: Secondary | ICD-10-CM | POA: Diagnosis not present

## 2014-01-21 DIAGNOSIS — Z79899 Other long term (current) drug therapy: Secondary | ICD-10-CM | POA: Diagnosis not present

## 2014-01-21 LAB — COMPREHENSIVE METABOLIC PANEL
ALK PHOS: 105 U/L
ANION GAP: 12 (ref 7–16)
Albumin: 3.8 g/dL (ref 3.4–5.0)
BILIRUBIN TOTAL: 1 mg/dL (ref 0.2–1.0)
BUN: 14 mg/dL (ref 7–18)
Calcium, Total: 9.2 mg/dL (ref 8.5–10.1)
Chloride: 101 mmol/L (ref 98–107)
Co2: 24 mmol/L (ref 21–32)
Creatinine: 1.55 mg/dL — ABNORMAL HIGH (ref 0.60–1.30)
EGFR (African American): 51 — ABNORMAL LOW
EGFR (Non-African Amer.): 44 — ABNORMAL LOW
GLUCOSE: 174 mg/dL — AB (ref 65–99)
Osmolality: 278 (ref 275–301)
POTASSIUM: 4.5 mmol/L (ref 3.5–5.1)
SGOT(AST): 35 U/L (ref 15–37)
SGPT (ALT): 59 U/L (ref 12–78)
SODIUM: 137 mmol/L (ref 136–145)
Total Protein: 8 g/dL (ref 6.4–8.2)

## 2014-01-21 LAB — CBC
HCT: 48.9 % (ref 40.0–52.0)
HGB: 15.8 g/dL (ref 13.0–18.0)
MCH: 29.2 pg (ref 26.0–34.0)
MCHC: 32.4 g/dL (ref 32.0–36.0)
MCV: 90 fL (ref 80–100)
PLATELETS: 167 10*3/uL (ref 150–440)
RBC: 5.41 10*6/uL (ref 4.40–5.90)
RDW: 12.9 % (ref 11.5–14.5)
WBC: 11.3 10*3/uL — ABNORMAL HIGH (ref 3.8–10.6)

## 2014-01-21 LAB — D-DIMER(ARMC): D-Dimer: 6116 ng/ml

## 2014-01-21 LAB — TROPONIN I: TROPONIN-I: 0.31 ng/mL — AB

## 2014-01-21 LAB — APTT: Activated PTT: 37 secs — ABNORMAL HIGH (ref 23.6–35.9)

## 2014-01-21 LAB — PROTIME-INR
INR: 1.1
PROTHROMBIN TIME: 13.8 s (ref 11.5–14.7)

## 2014-01-21 LAB — PRO B NATRIURETIC PEPTIDE: B-Type Natriuretic Peptide: 5981 pg/mL — ABNORMAL HIGH (ref 0–125)

## 2014-01-21 LAB — CK-MB: CK-MB: 2.2 ng/mL (ref 0.5–3.6)

## 2014-01-22 ENCOUNTER — Ambulatory Visit: Payer: Self-pay

## 2014-01-22 LAB — TROPONIN I
Troponin-I: 0.3 ng/mL — ABNORMAL HIGH
Troponin-I: 0.34 ng/mL — ABNORMAL HIGH

## 2014-01-22 LAB — CK-MB
CK-MB: 2.3 ng/mL (ref 0.5–3.6)
CK-MB: 2.6 ng/mL (ref 0.5–3.6)

## 2014-01-23 LAB — PROTIME-INR
INR: 1.3
Prothrombin Time: 15.7 secs — ABNORMAL HIGH (ref 11.5–14.7)

## 2014-01-24 LAB — CBC WITH DIFFERENTIAL/PLATELET
Basophil #: 0 10*3/uL (ref 0.0–0.1)
Basophil %: 0.5 %
EOS ABS: 0.2 10*3/uL (ref 0.0–0.7)
EOS PCT: 1.7 %
HCT: 41.6 % (ref 40.0–52.0)
HGB: 13.9 g/dL (ref 13.0–18.0)
LYMPHS ABS: 3.1 10*3/uL (ref 1.0–3.6)
LYMPHS PCT: 32.8 %
MCH: 29.8 pg (ref 26.0–34.0)
MCHC: 33.4 g/dL (ref 32.0–36.0)
MCV: 89 fL (ref 80–100)
MONOS PCT: 11 %
Monocyte #: 1.1 x10 3/mm — ABNORMAL HIGH (ref 0.2–1.0)
NEUTROS ABS: 5.2 10*3/uL (ref 1.4–6.5)
Neutrophil %: 54 %
Platelet: 144 10*3/uL — ABNORMAL LOW (ref 150–440)
RBC: 4.66 10*6/uL (ref 4.40–5.90)
RDW: 13.1 % (ref 11.5–14.5)
WBC: 9.6 10*3/uL (ref 3.8–10.6)

## 2014-01-24 LAB — PROTIME-INR
INR: 1.3
Prothrombin Time: 16 secs — ABNORMAL HIGH (ref 11.5–14.7)

## 2014-01-25 LAB — PROTIME-INR
INR: 1.2
PROTHROMBIN TIME: 14.8 s — AB (ref 11.5–14.7)

## 2014-01-25 LAB — CBC WITH DIFFERENTIAL/PLATELET
BASOS PCT: 0.7 %
Basophil #: 0.1 10*3/uL (ref 0.0–0.1)
Eosinophil #: 0.2 10*3/uL (ref 0.0–0.7)
Eosinophil %: 1.9 %
HCT: 43 % (ref 40.0–52.0)
HGB: 14.1 g/dL (ref 13.0–18.0)
Lymphocyte #: 2.7 10*3/uL (ref 1.0–3.6)
Lymphocyte %: 32.7 %
MCH: 29.9 pg (ref 26.0–34.0)
MCHC: 32.8 g/dL (ref 32.0–36.0)
MCV: 91 fL (ref 80–100)
MONOS PCT: 9.1 %
Monocyte #: 0.8 x10 3/mm (ref 0.2–1.0)
Neutrophil #: 4.6 10*3/uL (ref 1.4–6.5)
Neutrophil %: 55.6 %
Platelet: 154 10*3/uL (ref 150–440)
RBC: 4.71 10*6/uL (ref 4.40–5.90)
RDW: 13.2 % (ref 11.5–14.5)
WBC: 8.3 10*3/uL (ref 3.8–10.6)

## 2014-01-25 LAB — CREATININE, SERUM
Creatinine: 1.18 mg/dL (ref 0.60–1.30)
EGFR (Non-African Amer.): >60

## 2014-01-26 LAB — BASIC METABOLIC PANEL
Anion Gap: 5 — ABNORMAL LOW (ref 7–16)
BUN: 11 mg/dL (ref 7–18)
CREATININE: 1.05 mg/dL (ref 0.60–1.30)
Calcium, Total: 8.5 mg/dL (ref 8.5–10.1)
Chloride: 108 mmol/L — ABNORMAL HIGH (ref 98–107)
Co2: 26 mmol/L (ref 21–32)
EGFR (African American): >60
GLUCOSE: 99 mg/dL (ref 65–99)
OSMOLALITY: 277 (ref 275–301)
Potassium: 3.9 mmol/L (ref 3.5–5.1)
Sodium: 139 mmol/L (ref 136–145)

## 2014-01-26 LAB — CBC WITH DIFFERENTIAL/PLATELET
Basophil #: 0.1 10*3/uL (ref 0.0–0.1)
Basophil %: 0.7 %
EOS PCT: 2.9 %
Eosinophil #: 0.3 10*3/uL (ref 0.0–0.7)
HCT: 42.8 % (ref 40.0–52.0)
HGB: 14.5 g/dL (ref 13.0–18.0)
Lymphocyte #: 2.8 10*3/uL (ref 1.0–3.6)
Lymphocyte %: 32.8 %
MCH: 30.3 pg (ref 26.0–34.0)
MCHC: 34 g/dL (ref 32.0–36.0)
MCV: 89 fL (ref 80–100)
MONO ABS: 0.9 x10 3/mm (ref 0.2–1.0)
MONOS PCT: 10.2 %
Neutrophil #: 4.5 10*3/uL (ref 1.4–6.5)
Neutrophil %: 53.4 %
PLATELETS: 155 10*3/uL (ref 150–440)
RBC: 4.8 10*6/uL (ref 4.40–5.90)
RDW: 13 % (ref 11.5–14.5)
WBC: 8.5 10*3/uL (ref 3.8–10.6)

## 2014-01-26 LAB — PROTIME-INR
INR: 1.5
Prothrombin Time: 17.5 secs — ABNORMAL HIGH (ref 11.5–14.7)

## 2014-02-01 ENCOUNTER — Ambulatory Visit: Payer: Medicare Other | Admitting: Pulmonary Disease

## 2014-02-15 DIAGNOSIS — I824Y9 Acute embolism and thrombosis of unspecified deep veins of unspecified proximal lower extremity: Secondary | ICD-10-CM | POA: Diagnosis not present

## 2014-02-15 DIAGNOSIS — M7989 Other specified soft tissue disorders: Secondary | ICD-10-CM | POA: Diagnosis not present

## 2014-02-15 DIAGNOSIS — I87099 Postthrombotic syndrome with other complications of unspecified lower extremity: Secondary | ICD-10-CM | POA: Diagnosis not present

## 2014-02-21 ENCOUNTER — Encounter: Payer: Self-pay | Admitting: Pulmonary Disease

## 2014-02-21 ENCOUNTER — Ambulatory Visit (INDEPENDENT_AMBULATORY_CARE_PROVIDER_SITE_OTHER): Payer: Medicare Other | Admitting: Pulmonary Disease

## 2014-02-21 VITALS — BP 134/70 | HR 58 | Ht 75.5 in | Wt 247.0 lb

## 2014-02-21 DIAGNOSIS — I2699 Other pulmonary embolism without acute cor pulmonale: Secondary | ICD-10-CM

## 2014-02-21 DIAGNOSIS — J438 Other emphysema: Secondary | ICD-10-CM

## 2014-02-21 DIAGNOSIS — R911 Solitary pulmonary nodule: Secondary | ICD-10-CM | POA: Diagnosis not present

## 2014-02-21 DIAGNOSIS — J439 Emphysema, unspecified: Secondary | ICD-10-CM

## 2014-02-21 NOTE — Assessment & Plan Note (Addendum)
This has been a stable interval for Seth Perez.  Plan: Continue Spiriva daily Flu shot in the fall

## 2014-02-21 NOTE — Assessment & Plan Note (Signed)
Will repeat CT chest February 2016. I will order this study on his next visit with me.

## 2014-02-21 NOTE — Assessment & Plan Note (Signed)
He now needs lifelong therapy with anticoagulants. I agree with the choice of Xarelto.  At this point, I do not see a good reason to remove the IVC filter.  I think that his leg swelling (left greater than right) is do to the blood clot and not do to pulmonary hypertension.  Plan: -If shortness of breath worsens or the leg swelling does not improve then we will repeat an echocardiogram at his next visit in 4-6 months - Lifelong Xarelto

## 2014-02-21 NOTE — Progress Notes (Signed)
Subjective:    Patient ID: Seth Perez, male    DOB: 29-Dec-1940, 73 y.o.   MRN: [id number]  Synopsis: Seth Perez first saw the [facility] pulmonary clinic in March of 2014 for evaluation of pulmonary embolism, a pulmonary nodule and COPD. He had moderate obstruction on pulmonary function testing and was prescribed Spiriva. He had a provoked pulmonary embolism in late February 2014 and was treated for this with Xarelto. He also had a 1.2 cm pleural-based nodule in the right upper lobe.  In may of 2015 he had a repeat large pulmonary embolism requiring thrombolytic therapy and an IVC filter placement.  HPI   02/21/2014 Hospital F/U> Unfortunately Seth Perez was admitted to the hospital with a massive pulmonary embolism in May. He tells me that he was found to have clot in his left leg. He underwent thrombolytic therapy, had an IVC filter placed, and was restarted on Xarelto.  Since his hospitalization his breathing has been doing fine. He continues to have some leg swelling. He feels like he's not limited by his breathing at all. He has not been having nose bleeding. He continues to take Spiriva on a daily basis.   Past Medical History  Diagnosis Date  . Arthritis   . Chicken pox   . COPD (chronic obstructive pulmonary disease)   . Pulmonary emboli     on Xarelto     Review of Systems  Constitutional: Negative for fever, chills and fatigue.  HENT: Negative for postnasal drip, sinus pressure and sneezing.   Respiratory: Negative for cough, shortness of breath and wheezing.   Cardiovascular: Positive for leg swelling. Negative for chest pain and palpitations.       Objective:   Physical Exam   Filed Vitals:   02/21/14 1214  BP: 134/70  Pulse: 58  Height: 6' 3.5" (1.918 m)  Weight: 247 lb (112.038 kg)  SpO2: 93%  RA  Gen: well appearing, no acute distress HEENT: NCAT, EOMi, OP clear, PULM: CTA B CV: RRR, no mgr, no JVD AB: BS+, soft, nontender, no hsm Ext: warm,  increased swelling compared to usual, chronic venous stasis changes noted, no clubbing, no cyanosis      Assessment & Plan:   Solitary pulmonary nodule Will repeat CT chest February 2016. I will order this study on his next visit with me.  Acute pulmonary embolism He now needs lifelong therapy with anticoagulants. I agree with the choice of Xarelto.  At this point, I do not see a good reason to remove the IVC filter.  I think that his leg swelling (left greater than right) is do to the blood clot and not do to pulmonary hypertension.  Plan: -If shortness of breath worsens or the leg swelling does not improve then we will repeat an echocardiogram at his next visit in 4-6 months - Lifelong Xarelto  COPD (chronic obstructive pulmonary disease) with emphysema This has been a stable interval for Seth Perez.  Plan: Continue Spiriva daily Flu shot in the fall    Updated Medication List Outpatient Encounter Prescriptions as of 02/21/2014  Medication Sig  . albuterol (VENTOLIN HFA) 108 (90 BASE) MCG/ACT inhaler Inhale 2 puffs into the lungs every 6 (six) hours as needed for wheezing.  . furosemide (LASIX) 20 MG tablet TAKE 1/2 TABLET BY MOUTH EVERY MORNING AS NEEDED FOR LEG SWELLING  . rivaroxaban (XARELTO) 20 MG TABS tablet Take 20 mg by mouth daily with supper.  Marland Kitchen SPIRIVA HANDIHALER 18 MCG inhalation capsule INHALE 1 CAPSULE VIA HANDIHALER  ONCE DAILY AT THE SAME TIME EVERY DAY  . tamsulosin (FLOMAX) 0.4 MG CAPS Take 0.4 mg by mouth daily.

## 2014-02-21 NOTE — Patient Instructions (Signed)
Keep taking your Xarelto and Spiriva as you are doing  We will see you back in 4-6 months or sooner if needed

## 2014-03-23 ENCOUNTER — Other Ambulatory Visit: Payer: Self-pay | Admitting: *Deleted

## 2014-03-23 MED ORDER — RIVAROXABAN 20 MG PO TABS: 20.0000 mg | ORAL_TABLET | Freq: Every day | ORAL | Status: DC

## 2014-04-13 DIAGNOSIS — R972 Elevated prostate specific antigen [PSA]: Secondary | ICD-10-CM | POA: Diagnosis not present

## 2014-04-15 ENCOUNTER — Telehealth: Payer: Self-pay | Admitting: Pulmonary Disease

## 2014-04-15 MED ORDER — RIVAROXABAN 20 MG PO TABS: 20.0000 mg | ORAL_TABLET | Freq: Every day | ORAL | Status: DC

## 2014-04-15 NOTE — Telephone Encounter (Signed)
Pt aware RX has been sent in. Nothing further needed 

## 2014-06-15 ENCOUNTER — Other Ambulatory Visit: Payer: Self-pay

## 2014-06-15 MED ORDER — RIVAROXABAN 20 MG PO TABS
20.0000 mg | ORAL_TABLET | Freq: Every day | ORAL | Status: DC
Start: 2014-06-15 — End: 2014-08-15

## 2014-06-17 ENCOUNTER — Ambulatory Visit (INDEPENDENT_AMBULATORY_CARE_PROVIDER_SITE_OTHER): Payer: Medicare Other | Admitting: Internal Medicine

## 2014-06-17 ENCOUNTER — Encounter: Payer: Self-pay | Admitting: Internal Medicine

## 2014-06-17 VITALS — BP 124/72 | HR 91 | Temp 97.4°F | Resp 16 | Ht 75.5 in | Wt 251.0 lb

## 2014-06-17 DIAGNOSIS — I2699 Other pulmonary embolism without acute cor pulmonale: Secondary | ICD-10-CM

## 2014-06-17 DIAGNOSIS — R5383 Other fatigue: Secondary | ICD-10-CM | POA: Diagnosis not present

## 2014-06-17 DIAGNOSIS — Z7901 Long term (current) use of anticoagulants: Secondary | ICD-10-CM | POA: Diagnosis not present

## 2014-06-17 DIAGNOSIS — R600 Localized edema: Secondary | ICD-10-CM

## 2014-06-17 DIAGNOSIS — R5381 Other malaise: Secondary | ICD-10-CM | POA: Diagnosis not present

## 2014-06-17 DIAGNOSIS — Z1159 Encounter for screening for other viral diseases: Secondary | ICD-10-CM | POA: Diagnosis not present

## 2014-06-17 DIAGNOSIS — Z23 Encounter for immunization: Secondary | ICD-10-CM

## 2014-06-17 DIAGNOSIS — E785 Hyperlipidemia, unspecified: Secondary | ICD-10-CM

## 2014-06-17 DIAGNOSIS — R609 Edema, unspecified: Secondary | ICD-10-CM

## 2014-06-17 LAB — CBC WITH DIFFERENTIAL/PLATELET
Basophils Absolute: 0 10*3/uL (ref 0.0–0.1)
Basophils Relative: 0.4 % (ref 0.0–3.0)
Eosinophils Absolute: 0.3 10*3/uL (ref 0.0–0.7)
Eosinophils Relative: 3 % (ref 0.0–5.0)
HEMATOCRIT: 43.8 % (ref 39.0–52.0)
HEMOGLOBIN: 14.7 g/dL (ref 13.0–17.0)
LYMPHS ABS: 2.9 10*3/uL (ref 0.7–4.0)
Lymphocytes Relative: 32.8 % (ref 12.0–46.0)
MCHC: 33.5 g/dL (ref 30.0–36.0)
MCV: 90.4 fl (ref 78.0–100.0)
MONOS PCT: 9.1 % (ref 3.0–12.0)
Monocytes Absolute: 0.8 10*3/uL (ref 0.1–1.0)
NEUTROS ABS: 4.9 10*3/uL (ref 1.4–7.7)
Neutrophils Relative %: 54.7 % (ref 43.0–77.0)
Platelets: 203 10*3/uL (ref 150.0–400.0)
RBC: 4.84 Mil/uL (ref 4.22–5.81)
RDW: 13.3 % (ref 11.5–15.5)
WBC: 8.9 10*3/uL (ref 4.0–10.5)

## 2014-06-17 LAB — LIPID PANEL
Cholesterol: 203 mg/dL — ABNORMAL HIGH (ref 0–200)
HDL: 34.8 mg/dL — AB (ref >39.00)
LDL CALC: 141 mg/dL — AB (ref 0–99)
NonHDL: 168.2
Total CHOL/HDL Ratio: 6
Triglycerides: 138 mg/dL (ref 0.0–149.0)
VLDL: 27.6 mg/dL (ref 0.0–40.0)

## 2014-06-17 LAB — COMPREHENSIVE METABOLIC PANEL
ALBUMIN: 4.3 g/dL (ref 3.5–5.2)
ALK PHOS: 69 U/L (ref 39–117)
ALT: 21 U/L (ref 0–53)
AST: 22 U/L (ref 0–37)
BUN: 12 mg/dL (ref 6–23)
CALCIUM: 9.1 mg/dL (ref 8.4–10.5)
CO2: 24 mEq/L (ref 19–32)
Chloride: 104 mEq/L (ref 96–112)
Creatinine, Ser: 1.1 mg/dL (ref 0.4–1.5)
GFR: 72.54 mL/min (ref >60.00)
Glucose, Bld: 111 mg/dL — ABNORMAL HIGH (ref 70–99)
POTASSIUM: 4.4 meq/L (ref 3.5–5.1)
SODIUM: 137 meq/L (ref 135–145)
TOTAL PROTEIN: 7.3 g/dL (ref 6.0–8.3)
Total Bilirubin: 0.8 mg/dL (ref 0.2–1.2)

## 2014-06-17 LAB — TSH: TSH: 1.82 u[IU]/mL (ref 0.35–4.50)

## 2014-06-17 MED ORDER — ZOSTER VACCINE LIVE 19400 UNT/0.65ML ~~LOC~~ SOLR: 0.6500 mL | Freq: Once | SUBCUTANEOUS | Status: DC

## 2014-06-17 MED ORDER — TETANUS-DIPHTH-ACELL PERTUSSIS 5-2.5-18.5 LF-MCG/0.5 IM SUSP: 0.5000 mL | Freq: Once | INTRAMUSCULAR | Status: DC

## 2014-06-17 NOTE — Progress Notes (Signed)
Pre-visit discussion using our clinic review tool. No additional management support is needed unless otherwise documented below in the visit note.  

## 2014-06-17 NOTE — Assessment & Plan Note (Addendum)
There was no lower extremity source in either leg by concurrent  Ultrasound .  Hypercoag workup was reportedly done.  Life long anticoagulation  Needed.

## 2014-06-17 NOTE — Patient Instructions (Addendum)
You need to wear your compression stockings on a daily basis to manage your lower leg swelling  Taking lasix will not eradicate the problems.   Follow up with Dr Lake Bells as directed  We will see you in a year , sooner if needed or if your labs indicate a problem

## 2014-06-17 NOTE — Progress Notes (Signed)
Patient ID: Seth BREITHAUPT Sr., male   DOB: 03-Apr-1941, 73 y.o.   MRN: [id number]     Patient Active Problem List   Diagnosis Date Noted  . Bilateral lower extremity edema 06/19/2014  . Recurrent pulmonary embolism 11/24/2012  . Solitary pulmonary nodule 11/24/2012  . Hospital discharge follow-up 11/24/2012  . Pain in left shoulder 11/03/2012  . Pain in right knee 11/03/2012  . COPD (chronic obstructive pulmonary disease) with emphysema 11/03/2012  . Colon cancer screening 11/03/2012  . Prostate cancer screening 11/03/2012  . Routine general medical examination at a health care facility 11/03/2012    Subjective:  CC:   Chief Complaint  Patient presents with  . Follow-up    bilateral edem in lower extremeties.    HPI:   Seth EAKINS Sr. is a 73 y.o. male who presents for 6 month follow up on  multiple conditions.  Since his last visi he was diagnosed with a second pulmonary embolism in May 2015 while not anticoagulated.  He completed 6 months of Xarelto for PE which was diagnosed during second hospitalization in one month for acute hypoxic respiratory failure in February 2014.  His most recent  PE was found during ER visit for chest tightness,  Irregular heartbeat.     Still dealing with chronic LE edema bilaterally complicated by a large Baker's cyst  Found on ultrasound done in March 2015. He has chronic asymmetry, with left  Leg chronically larger than the right  .     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      Past Medical History  Diagnosis Date  . Arthritis   . Chicken pox   . COPD (chronic obstructive pulmonary disease)   . Pulmonary emboli     on Xarelto    Past Surgical History  Procedure Laterality Date  . Cholecystectomy  2008  . Tonsillectomy and adenoidectomy   1947       The following portions of the patient's history were reviewed and updated as appropriate: Allergies, current medications, and problem list.    Review of Systems:   Patient denies headache, fevers, malaise, unintentional weight loss, skin rash, eye pain, sinus congestion and sinus pain, sore throat, dysphagia,  hemoptysis , cough, dyspnea, wheezing, chest pain, palpitations, orthopnea, edema, abdominal pain, nausea, melena, diarrhea, constipation, flank pain, dysuria, hematuria, urinary  Frequency, nocturia, numbness, tingling, seizures,  Focal weakness, Loss of consciousness,  Tremor, insomnia, depression, anxiety, and suicidal ideation.     History   Social History  . Marital Status: Divorced    Spouse Name: N/A    Number of Children: N/A  . Years of Education: N/A   Occupational History  . Not on file.   Social History Main Topics  . Smoking status: Former Smoker -- 1.00 packs/day for 50 years    Types: Cigarettes    Quit date: 10/12/2012  . Smokeless tobacco: Never Used  . Alcohol Use: 1.0 oz/week    2 drink(s) per week  . Drug Use: No  . Sexual Activity: Not on file   Other Topics Concern  . Not on file   Social History Narrative  . No narrative on file    Objective:  Filed Vitals:   06/17/14 0938  BP: 124/72  Pulse: 91  Temp: 97.4 F (36.3 C)  Resp: 16     General appearance: alert, cooperative and appears stated age Ears: normal TM's and external ear canals both ears Throat: lips, mucosa, and tongue normal; teeth and gums normal Neck: no adenopathy, no carotid bruit, supple, symmetrical, trachea midline and thyroid not enlarged, symmetric, no tenderness/mass/nodules Back: symmetric, no curvature. ROM normal. No CVA tenderness. Lungs: clear to auscultation bilaterally Heart: regular rate and rhythm, S1, S2 normal, no murmur, click, rub or gallop Abdomen: soft, non-tender; bowel sounds normal; no masses,  no organomegaly Pulses: 2+ and  symmetric Skin: Skin color, texture, turgor normal. No rashes or lesions Lymph nodes: Cervical, supraclavicular, and axillary nodes normal.  Assessment and Plan:  Recurrent pulmonary embolism There was no lower extremity source in either leg by concurrent  Ultrasound .  Hypercoag workup was reportedly done.  Life long anticoagulation  Needed.   Bilateral lower extremity edema Multifactorial, with cor pulmonale, Baker's cyst and venous insufficiency all contributing.  Advised to continue daily use of compression stockings.  A total of 40 minutes was spent with patient more than half of which was spent in counseling patient on the above mentioned issues , reviewing and explaining recent labs and imaging studies done, and coordination of care.  Updated Medication List Outpatient Encounter Prescriptions as of 06/17/2014  Medication Sig  . albuterol (VENTOLIN HFA) 108 (90 BASE) MCG/ACT inhaler Inhale 2 puffs into the lungs every 6 (six) hours as needed for wheezing.  . furosemide (LASIX) 20 MG tablet TAKE 1/2 TABLET BY MOUTH EVERY MORNING AS NEEDED FOR LEG SWELLING  . rivaroxaban (XARELTO) 20 MG TABS tablet Take 1 tablet (20 mg total) by mouth daily with supper.  Marland Kitchen SPIRIVA HANDIHALER 18 MCG inhalation capsule INHALE 1 CAPSULE VIA HANDIHALER ONCE DAILY AT THE SAME TIME EVERY DAY  . tamsulosin (FLOMAX) 0.4 MG CAPS Take 0.4 mg by mouth daily.  . Tdap (BOOSTRIX) 5-2.5-18.5 LF-MCG/0.5 injection Inject 0.5 mLs into the muscle once.  . zoster vaccine live, PF, (ZOSTAVAX) 63875 UNT/0.65ML injection Inject 19,400 Units into the skin once.     Orders Placed This Encounter  Procedures  . Pneumococcal conjugate vaccine 13-valent  . TSH  . CBC with Differential  . Comprehensive metabolic panel  . Lipid panel  . Hepatitis C antibody  . HM COLONOSCOPY  . HM COLONOSCOPY    Return in about 1 year (around 06/18/2015).

## 2014-06-18 LAB — HEPATITIS C ANTIBODY: HCV Ab: NEGATIVE

## 2014-06-19 ENCOUNTER — Encounter: Payer: Self-pay | Admitting: Internal Medicine

## 2014-06-19 DIAGNOSIS — I878 Other specified disorders of veins: Secondary | ICD-10-CM | POA: Insufficient documentation

## 2014-06-19 NOTE — Assessment & Plan Note (Signed)
Multifactorial, with cor pulmonale, Baker's cyst and venous insufficiency all contributing.  Advised to continue daily use of compression stockings.

## 2014-06-20 ENCOUNTER — Encounter: Payer: Self-pay | Admitting: *Deleted

## 2014-07-04 ENCOUNTER — Encounter: Payer: Self-pay | Admitting: Pulmonary Disease

## 2014-07-04 ENCOUNTER — Ambulatory Visit (INDEPENDENT_AMBULATORY_CARE_PROVIDER_SITE_OTHER): Payer: Medicare Other | Admitting: Pulmonary Disease

## 2014-07-04 VITALS — BP 136/74 | HR 64 | Ht 75.5 in | Wt 252.0 lb

## 2014-07-04 DIAGNOSIS — I2699 Other pulmonary embolism without acute cor pulmonale: Secondary | ICD-10-CM

## 2014-07-04 DIAGNOSIS — R911 Solitary pulmonary nodule: Secondary | ICD-10-CM | POA: Diagnosis not present

## 2014-07-04 DIAGNOSIS — J432 Centrilobular emphysema: Secondary | ICD-10-CM | POA: Diagnosis not present

## 2014-07-04 MED ORDER — TIOTROPIUM BROMIDE MONOHYDRATE 18 MCG IN CAPS: 18.0000 ug | ORAL_CAPSULE | Freq: Every day | RESPIRATORY_TRACT | Status: DC

## 2014-07-04 NOTE — Assessment & Plan Note (Signed)
This has been a stable interval for Seth Perez. His flu shot and pneumonia shots are up-to-date.  Plan: -Continue Spiriva -Followup 6 months

## 2014-07-04 NOTE — Progress Notes (Signed)
Subjective:    Patient ID: Seth Perez., male    DOB: 1940-10-01, 73 y.o.   MRN: [id number]  Synopsis: Seth Perez first saw the [facility] pulmonary clinic in March of 2014 for evaluation of pulmonary embolism, a pulmonary nodule and COPD. He had moderate obstruction on pulmonary function testing and was prescribed Spiriva. He had a provoked pulmonary embolism in late February 2014 and was treated for this with Xarelto. He also had a 1.2 cm pleural-based nodule in the right upper lobe.  In may of 2015 he had a repeat large pulmonary embolism requiring thrombolytic therapy and an IVC filter placement.  HPI  Chief Complaint  Patient presents with  . Follow-up    Pt states he is stable.  C/o SOB with exertion, no other complaints at this time.     07/04/2014 ROV> [name] has been doing well lately.  His leg swelling has improved significantly since the last visit.  They are down quite a bit more than than they have ben in years. When he is on his feet more they swell, but not too bad. His breathing has been OK with occassional phlegm daily.  He continues to take Spiriva daily.  He has not been treated with prednisone or antibiotics since the last visit.  He had a flu shot since his last visit.  He recently had his pneumonia shot.  He takes Xarelto every day and only has minor gum bleeding on occasion or more bleeding from scars, but nothing too bad.  He has not taken lasix in a month because he was feeling more lightheaded on days he was taking it. He did not really think that he had a lot of swelling on those days.     Past Medical History  Diagnosis Date  . Arthritis   . Chicken pox   . COPD (chronic obstructive pulmonary disease)   . Pulmonary emboli     on Xarelto     Review of Systems  Constitutional: Negative for fever, chills and fatigue.  HENT: Negative for postnasal drip, sinus pressure and sneezing.   Respiratory: Negative for cough, shortness of breath and wheezing.    Cardiovascular: Negative for chest pain, palpitations and leg swelling.       Objective:   Physical Exam   Filed Vitals:   07/04/14 1047  BP: 136/74  Pulse: 64  Height: 6' 3.5" (1.918 m)  Weight: 252 lb (114.306 kg)  SpO2: 100%  RA  Gen: well appearing, no acute distress HEENT: NCAT, EOMi, OP clear, PULM: CTA B CV: RRR, no mgr, no JVD AB: BS+, soft, nontender,  Ext: warm, chronic swelling decreased      Assessment & Plan:   COPD (chronic obstructive pulmonary disease) with emphysema This has been a stable interval for Seth Perez. His flu shot and pneumonia shots are up-to-date.  Plan: -Continue Spiriva -Followup 6 months  Recurrent pulmonary embolism He needs lifelong anticoagulation. Today we talked about the IVC filter. I would prefer to leave it in place because if he were to ever have to stop anticoagulation he would need protection from a third pulmonary embolism somehow. So even though the IVC filter carries some risk of DVT this should be mitigated by the Xarelto.  Plan: -Lifelong Xarelto  Solitary pulmonary nodule Final CT chest ordered for February 2016.    Updated Medication List Outpatient Encounter Prescriptions as of 07/04/2014  Medication Sig  . albuterol (VENTOLIN HFA) 108 (90 BASE) MCG/ACT inhaler Inhale 2 puffs into  the lungs every 6 (six) hours as needed for wheezing.  . furosemide (LASIX) 20 MG tablet TAKE 1/2 TABLET BY MOUTH EVERY MORNING AS NEEDED FOR LEG SWELLING  . rivaroxaban (XARELTO) 20 MG TABS tablet Take 1 tablet (20 mg total) by mouth daily with supper.  Marland Kitchen SPIRIVA HANDIHALER 18 MCG inhalation capsule INHALE 1 CAPSULE VIA HANDIHALER ONCE DAILY AT THE SAME TIME EVERY DAY  . tamsulosin (FLOMAX) 0.4 MG CAPS Take 0.4 mg by mouth daily.  . [DISCONTINUED] Tdap (BOOSTRIX) 5-2.5-18.5 LF-MCG/0.5 injection Inject 0.5 mLs into the muscle once.  . [DISCONTINUED] zoster vaccine live, PF, (ZOSTAVAX) 57903 UNT/0.65ML injection Inject 19,400 Units  into the skin once.

## 2014-07-04 NOTE — Assessment & Plan Note (Signed)
He needs lifelong anticoagulation. Today we talked about the IVC filter. I would prefer to leave it in place because if he were to ever have to stop anticoagulation he would need protection from a third pulmonary embolism somehow. So even though the IVC filter carries some risk of DVT this should be mitigated by the Xarelto.  Plan: -Lifelong Xarelto

## 2014-07-04 NOTE — Patient Instructions (Signed)
We will see you back in February 2016 after you have a CT scan of your lungs for the pulmonary nodule Keep taking Spiriva Keep taking Xarelto

## 2014-07-04 NOTE — Assessment & Plan Note (Signed)
Final CT chest ordered for February 2016.

## 2014-07-26 DIAGNOSIS — I82429 Acute embolism and thrombosis of unspecified iliac vein: Secondary | ICD-10-CM | POA: Diagnosis not present

## 2014-07-26 DIAGNOSIS — I87099 Postthrombotic syndrome with other complications of unspecified lower extremity: Secondary | ICD-10-CM | POA: Diagnosis not present

## 2014-07-26 DIAGNOSIS — M7989 Other specified soft tissue disorders: Secondary | ICD-10-CM | POA: Diagnosis not present

## 2014-08-15 ENCOUNTER — Telehealth: Payer: Self-pay | Admitting: Pulmonary Disease

## 2014-08-15 MED ORDER — RIVAROXABAN 20 MG PO TABS: 20.0000 mg | ORAL_TABLET | Freq: Every day | ORAL | Status: DC

## 2014-08-15 NOTE — Telephone Encounter (Signed)
lmomtcb x1 RX sent in 

## 2014-08-16 NOTE — Telephone Encounter (Signed)
Called and spoke to pt. Informed pt the rx has already been sent to pharmacy. Pt verbalized understanding and denied any further questions or concerns at this time.

## 2014-11-15 ENCOUNTER — Ambulatory Visit: Payer: Self-pay | Admitting: Pulmonary Disease

## 2014-11-15 DIAGNOSIS — R918 Other nonspecific abnormal finding of lung field: Secondary | ICD-10-CM | POA: Diagnosis not present

## 2014-11-15 DIAGNOSIS — I2510 Atherosclerotic heart disease of native coronary artery without angina pectoris: Secondary | ICD-10-CM | POA: Diagnosis not present

## 2014-11-17 ENCOUNTER — Telehealth: Payer: Self-pay | Admitting: Pulmonary Disease

## 2014-11-17 MED ORDER — RIVAROXABAN 20 MG PO TABS: 20.0000 mg | ORAL_TABLET | Freq: Every day | ORAL | Status: DC

## 2014-11-17 NOTE — Telephone Encounter (Signed)
Rx has been sent in. Nothing further was needed. 

## 2015-01-13 NOTE — Consult Note (Signed)
 General Aspect 74-year-old Caucasian male, a long-time smoker, who has smoked since age 12 and quit after recent discharge from the hospital in early February 2013 for acute respiratory failure, likely secondary to a COPD flare and bronchitis. He presents with nausea, vomiting, malaise, tachycardia, hypoxia. Cardiology was consulted for elevated cardiac enz.   he was doing good and was discharged on prednisone taper, Levaquin, Spiriva and Advair. elevated PSA and was given Cipro for 14 days. On Monday, he did heavy exertion with walking  the trash to the dump and overexerted himself and started to have shortness of breath. The shortness of breath progressed to wheezing which was audible, palpitations, racing heart rate, mild cough which is nonproductive. This seemed to get better.  Wednesday at 2 AM, he remembered to take his cipro pills x 2. Shortly after, he developed tachycardia, dry heaves with nausea, feeling cold and clammy.  he did develop chest pain with the dry heaving.  no chest pain on arrival to the ER. Noted to have tachycardia.he was noted to have O2 sat of 82% on room air and a troponin of 0.73.  tachycardic with rate of 130s despite some nebs.   Physical Exam:  GEN well developed, well nourished, no acute distress   HEENT red conjunctivae   NECK supple   RESP normal resp effort  clear BS  wheezing   CARD Regular rate and rhythm  Tachycardic  No murmur   ABD denies tenderness  soft   LYMPH negative neck   EXTR negative edema   SKIN normal to palpation   NEURO cranial nerves intact, motor/sensory function intact   PSYCH alert, A+O to time, place, person, good insight   Review of Systems:  Subjective/Chief Complaint nausea, tachycardia, SOB   General: No Complaints   Skin: No Complaints   ENT: No Complaints   Eyes: No Complaints   Neck: No Complaints   Respiratory: Short of breath  Wheezing   Cardiovascular: Dyspnea   Gastrointestinal: No Complaints    Genitourinary: No Complaints   Vascular: No Complaints   Musculoskeletal: No Complaints   Neurologic: No Complaints   Hematologic: No Complaints   Endocrine: No Complaints   Psychiatric: No Complaints   Review of Systems: All other systems were reviewed and found to be negative   Medications/Allergies Reviewed Medications/Allergies reviewed     Negative, patient denies medical history.:        Admit Diagnosis:   ACUTE RESP FAILURE: Onset Date: 19-Nov-2012, Status: Active, Description: ACUTE RESP FAILURE  Home Medications: Medication Instructions Status  ciprofloxacin 500 mg oral tablet 1 tab(s) orally 2 times a day for 14 days Active  Advair Diskus 250 mcg-50 mcg inhalation powder 1 puff(s) inhaled every 12 hours Active  furosemide 20 mg oral tablet 0.5 tab(s) orally once a day, As Needed for leg swelling Active  Spiriva 18 mcg inhalation capsule 1 each inhaled once a day Active   Lab Results: Routine Chem:  27-Feb-14 01:47   Result Comment TROPONIN - RESULTS VERIFIED BY REPEAT TESTING.  - PREVIOUS CALL:11/18/12@1847...TPL  Result(s) reported on 19 Nov 2012 at 02:57AM.  Glucose, Serum  224  BUN 16  Creatinine (comp)  1.32  Sodium, Serum 138  Potassium, Serum 4.3  Chloride, Serum 106  CO2, Serum 22  Calcium (Total), Serum  8.0  Anion Gap 10  Osmolality (calc) 284  eGFR (African American) >60  eGFR (Non-African American)  54 (eGFR values <60mL/min/1.73 m2 may be an indication of chronic kidney disease (  CKD). Calculated eGFR is useful in patients with stable renal function. The eGFR calculation will not be reliable in acutely ill patients when serum creatinine is changing rapidly. It is not useful in  patients on dialysis. The eGFR calculation may not be applicable to patients at the low and high extremes of body sizes, pregnant women, and vegetarians.)  Magnesium, Serum  1.6 (1.8-2.4 THERAPEUTIC RANGE: 4-7 mg/dL TOXIC: > 10 mg/dL  -----------------------)   Cholesterol, Serum 151  Triglycerides, Serum 71  HDL (INHOUSE)  26  VLDL Cholesterol Calculated 14  LDL Cholesterol Calculated  111 (Result(s) reported on 19 Nov 2012 at 02:17AM.)  Cardiac:  27-Feb-14 01:47   CK, Total 66  CPK-MB, Serum 2.6 (Result(s) reported on 19 Nov 2012 at 02:36AM.)  Troponin I  0.70 (0.00-0.05 0.05 ng/mL or less: NEGATIVE  Repeat testing in 3-6 hrs  if clinically indicated. >0.05 ng/mL: POTENTIAL  MYOCARDIAL INJURY. Repeat  testing in 3-6 hrs if  clinically indicated. NOTE: An increase or decrease  of 30% or more on serial  testing suggests a  clinically important change)  Routine Hem:  27-Feb-14 01:47   WBC (CBC) 10.1  RBC (CBC)  4.13  Hemoglobin (CBC)  12.3  Hematocrit (CBC)  37.4  Platelet Count (CBC) 188  MCV 91  MCH 29.9  MCHC 33.0  RDW 13.0  Neutrophil % 84.9  Lymphocyte % 8.9  Monocyte % 5.9  Eosinophil % 0.0  Basophil % 0.3  Neutrophil #  8.6  Lymphocyte #  0.9  Monocyte # 0.6  Eosinophil # 0.0  Basophil # 0.0 (Result(s) reported on 19 Nov 2012 at 02:14AM.)   EKG:  Interpretation EKG shows sinus tachycardia with rate 127 bpm, nonspecific ST ABN   Radiology Results: XRay:    26-Feb-14 17:48, Chest Portable Single View  Chest Portable Single View   REASON FOR EXAM:    Shortness of Breath  COMMENTS:       PROCEDURE: DXR - DXR PORTABLE CHEST SINGLE VIEW  - Nov 18 2012  5:48PM     RESULT: Comparison: 10/24/2012, 10/22/2012    Findings:  The heart and mediastinum are stable given patient rotation. Mild hilar   prominence is likely related to prominent central pulmonary vasculature.   No focal pulmonary opacities. Degenerative changes are seen in the left   shoulder.    IMPRESSION:    1. No acute cardiopulmonary disease.   2. Mild hilar prominence is likely related to prominent central pulmonary   vasculature. Lymphadenopathy would be difficult to exclude. Followup PA   and lateral chest radiographs are  suggested.      Dictation Site: 8        Verified By: ROBERT L. SUBER, M.D., MD    No Known Allergies:   Vital Signs/Nurse's Notes: **Vital Signs.:   27-Feb-14 07:50  Vital Signs Type Routine  Temperature Temperature (F) 97.2  Celsius 36.2  Temperature Source axillary  Pulse Pulse 120  Respirations Respirations 20  Systolic BP Systolic BP 116  Diastolic BP (mmHg) Diastolic BP (mmHg) 81  Mean BP 92  Pulse Ox % Pulse Ox % 95  Pulse Ox Activity Level  At rest  Oxygen Delivery Non-invasive ventilation (CPAP/BIPAP)    Impression 74-year-old Caucasian male, a long-time smoker, who has smoked since age 12 and quit after recent discharge from the hospital in early February 2013 for acute respiratory failure, likely secondary to a COPD flare and bronchitis. He presents with nausea, vomiting, malaise, tachycardia, hypoxia. Cardiology was consulted   for elevated cardiac enz.  1) Elevated cardiac enz: troponin mildly elevated though unchanged x 2 Ck and CKMB normal. no chest pain. He does have tachycardia of uncertain etiology. --Currently on bipap, with tachycardia. --Ideally would send for stress test though given respiratory distress and tachycardia, this could be done at a later date, possibly even as an outpt. (given recent/active COPD flares, hesitant to use lexiscan, can not treadmill secondary to bad knees, dobutamine would worsen tachycardia)  2) tachycardia: could be contributing to SOB appears to be sinus tach Unable to exclude PE as cause of tachycardia could consider a CTA if no improvement in sx. --For elevated rate, could start low dose diltiazem 30 mg q6 with hold parameters --Could also try low dose bystolic 5 mg (less bronchospasm)  3) respiratory distress: underlying COPD, recent flare on bipap, ABX, steroids COuld try to wean Bipap  4)Smoking: encouraged he continue smoking cessation   Electronic Signatures: [name] (MD)  (Signed 27-Feb-14  08:39)  Authored: General Aspect/Present Illness, History and Physical Exam, Review of System, Past Medical History, Health Issues, Home Medications, Labs, EKG , Radiology, Allergies, Vital Signs/Nurse's Notes, Impression/Plan   Last Updated: 27-Feb-14 08:39 by [name] (MD)

## 2015-01-13 NOTE — H&P (Signed)
PATIENT NAME:  Seth Perez, Seth Perez MR#:  [id number] DATE OF BIRTH:  July 11, 1941  DATE OF ADMISSION:  11/18/2012  PRIMARY CARE PHYSICIAN: [facility].   REFERRING PHYSICIAN: Dr. Corky Downs.   CHIEF COMPLAINT: Shortness of breath.   HISTORY OF PRESENT ILLNESS: The patient is a pleasant 74 year old Caucasian male, a long-time smoker, who has smoked since age 47 and quit after recent discharge from the hospital in early February for acute respiratory failure, likely secondary to a COPD flare and bronchitis. The patient stated that overall he was doing good and was discharged on prednisone taper, Levaquin, Spiriva and Advair. He did follow with [facility] and was noted to have elevated PSA and was given Cipro for 14 days. On Monday, he did heavy exertion with taking care of the trash to the dump and possibly overexerted himself and started to have shortness of breath. The shortness of breath progressed to wheezing which was audible, palpitations, racing heart rate, mild cough which is nonproductive. He also this morning started to have dry heaves with nausea, feeling cold and clammy. He had did not have any pains in the chest, but stated that he did develop chest pain with the dry heaving. He has no chest pain now. On arrival, he was noted to have O2 sat of 82% on room air with renal failure and a troponin of 0.73. He was significantly tachycardic with rate of 130s despite some nebs. Hospitalist service was contacted for further evaluation and management.   PAST MEDICAL HISTORY: Shoulder arthritis, recently discharged from the hospital with respiratory failure and acute bronchitis and COPD exacerbation.   PAST SURGICAL HISTORY: Cholecystectomy, tonsillectomy, adenoidectomy.   ALLERGIES: No known drug allergies.   SOCIAL HISTORY: Lives with his wife. Smokes a pack a day. Started at age 57, quit a couple of weeks ago. No alcohol or drug use.   FAMILY HISTORY: Brother deceased with lung cancer. Dad died  from COPD and congestive heart failure. Mom with a ruptured pancreas.   OUTPATIENT MEDICATIONS: Spiriva 18 mcg daily 1 cap inhaled, Advair 250/50 mcg  inhaled 1 puff 2 times a day, Cipro 500 mg 2 times a day for 14 days started on February 18, Lasix 20 mg 1/2 tablet a day as needed for swelling in the legs.   REVIEW OF SYSTEMS:   CONSTITUTIONAL: Denies fever, but decreased weight secondary to diuresis as well as some chills today.  EYES: Denies blurry vision or double vision. Thinks he might have cataracts.  ENT: No tinnitus or hearing loss. No snoring or postnasal drip.  RESPIRATORY: Positive for mild nonproductive cough, but significant shortness of breath and wheezing. No hemoptysis.  CARDIOVASCULAR: Chest pain while dry heaving. No chest pain now. No swelling in the legs currently. No syncope. Positive for palpitations.  GASTROINTESTINAL: Some nausea earlier, some chronic loose stools for multiple years.  GENITOURINARY: Denies dysuria, hematuria or frequency.  ENDOCRINE: Denies polyuria or nocturia.  HEMATOLOGIC AND LYMPHATIC: Denies anemia or easy bruising.  SKIN: Denies any rashes.  MUSCULOSKELETAL: Has left shoulder arthritis.  NEUROLOGIC: Denies focal weakness or numbness.  PSYCHIATRIC: Denies anxiety or insomnia.   PHYSICAL EXAMINATION:  VITAL SIGNS: Temperature on arrival was 97. Pulse rate on arrival was 130. Last pulse was 134 while I was in the room. Initial respiratory rate was 24. While I was in the room, it was close to 35. Initial blood pressure 118/89. Initial O2 sat was 89% on room air.  GENERAL: The patient is an obese Caucasian male sitting  in bed in moderate respiratory distress, using accessory muscles and tachypneic and tachycardic.  HEENT: Normocephalic, atraumatic. Pupils are equal and reactive. Anicteric sclerae. Extraocular muscles intact. Moist mucous membranes.  NECK: Supple. No thyroid tenderness. No cervical lymphadenopathy.  CARDIOVASCULAR: S1, S2,  tachycardic. No significant murmurs appreciated.  LUNGS: Decreased breath sounds bilaterally. Wheezing greater on the right side than the left with bilateral wheezing. No crackles.  ABDOMEN: Soft, nontender, nondistended. Positive bowel sounds in all quadrants.  EXTREMITIES: No significant lower extremity edema. There are chronic venous stasis changes.  NEUROLOGICAL: Cranial nerves II through XII are grossly intact. Strength is 5/5 in all extremities. Sensation intact to light touch.   LABORATORY, DIAGNOSTIC AND RADIOLOGICAL DATA: Glucose 160, BUN 18, creatinine 1.5. Of note, it was 0.9 on February 5. GFR of 46, albumin of 2.2, AST 46, alkaline phosphatase 104, ALT 65. Troponin 0.73. WBC 11.9, platelets 236, hemoglobin 13.7. ABG showed pH of 7.45, pCO2 of 27, pO2 of 73 on oxygen. X-ray of the chest 1 view shows no acute cardiopulmonary disease, mild hilar prominence. EKG: There are 2, both sinus tachycardia, 1 with a rate of 127, 1 with a rate of 128, nonspecific T wave and ST changes but no acute ST elevations or significant depressions.   ASSESSMENT AND PLAN: We have a pleasant 74 year old male with a recent hospitalization, who was discharged on February 5 for acute respiratory failure and chronic obstructive pulmonary disease exacerbation and likely bronchitis with a prednisone taper, new inhalers including Spiriva and Advair, who presents with shortness of breath and respiratory failure with positive troponins. At this point, we will admit the patient to the hospital. The patient has acute respiratory failure with significant hypoxemia, using accessory muscles, is tachypneic and tachycardic. I would start the patient on BiPAP. He is not a CO2 retainer at this point. However, he is persistently tachycardic and has shortness of breath and dyspnea on minimum exertion. Would start him on high-dose steroids, around-the-clock nebulizers, Spiriva and Levaquin. He does not appear to have a pneumonia. There is  no fever and there is no productive cough or x-ray of the chest suggesting a pneumonia at this point. He does have a positive troponin, likely demand ischemia as he has been having hypoxic respiratory failure and significant tachycardia for the last couple of days. There are no acute ST elevations or depressions, but the troponin is rather high. Would cycle the troponins, start the patient on aspirin and a statin, and obtain a cardiology consult. There is a recent echocardiogram showing ejection fraction of 55% or so and possible diastolic dysfunction. At this point, he does not appear to be volume overloaded. Would obtain a cardiology consult for further evaluation. The patient stated that he had a stress test in 2008 which was negative. His lipids were in the low 100s in the last hospitalization and I would not repeat that at this point. He is tachycardic, but likely lungs are driving that process. He should improve after getting his breathing in check. Will start him on deep vein thrombosis prophylaxis with heparin. He does have acute renal failure and we would start him on gentle IV fluids.   CRITICAL CARE TIME: 50 minutes.   CODE STATUS: The patient is full code.   ____________________________ Vivien Presto, MD sa:jm D: 11/18/2012 19:55:38 ET T: 11/18/2012 20:32:23 ET JOB#: [id number]  cc: Vivien Presto, MD, <Dictator> [place] MD ELECTRONICALLY SIGNED 11/30/2012 13:18

## 2015-01-13 NOTE — Discharge Summary (Signed)
PATIENT NAME:  Seth Perez, KLIPPEL MR#:  [id number] DATE OF BIRTH:  10-22-1940  DATE OF ADMISSION:  10/22/2012 DATE OF DISCHARGE:  10/28/2012  ADMITTING PHYSICIAN: Dr. Margaretmary Eddy.   DISCHARGING PHYSICIAN: Gladstone Lighter, MD.   PRIMARY CARE PHYSICIAN: None.   [place]: None but the patient was set up to see [employer], Charolette Forward, nurse practitioner, for November 02, 2012, at 2 p.m.    DISCHARGE DIAGNOSES:  1. Acute respiratory failure.  2. COPD exacerbation and bronchitis.  3. Tobacco use disorder.  4. Pneumonia, right lower lobe, was seen on chest x-ray on admission which resolved by the time of discharge.   DISCHARGE HOME MEDICATIONS:  1. Advair 250/50 one puff b.i.d.  2. Spiriva 18 mcg inhalation capsule daily.  3. Lasix 20 mg p.o. daily for 3 days, then stop.  4. Levaquin 750 mg p.o. daily for 3 more days, then stop.  5. Prednisone taper.   DISCHARGE HOME OXYGEN: None.   DISCHARGE DIET: Low-sodium diet.   DISCHARGE ACTIVITY: As tolerated.    FOLLOWUP INSTRUCTIONS: PCP followup in 1 to 2 weeks, and the patient is established to see Charolette Forward, nurse practitioner from [facility] in [place], for November 02, 2012.   LABS AND IMAGING STUDIES:  1. Sodium 140, potassium 4.1, chloride 106, bicarb 26, BUN 23, creatinine 0.91, glucose 133 and calcium of 8.2.   2. Troponin negative.  3. Influenza test has been negative.  4. Echo Doppler showing normal LV systolic function, EF greater than 55% and impaired LV relaxation. Right ventricular systolic pressure is normal.  5. HbA1c is 5.7.  6. Chest x-ray showing clear lung fields. No acute disease of the chest.  7. WBC is 10.4, hemoglobin 13.8, hematocrit 41.7, platelet count 158.  8. LDL 104, HDL 26, total cholesterol 143, triglycerides 63.  9. Blood cultures on admission remained negative.   BRIEF HOSPITAL COURSE: The patient is a 74 year old Caucasian male with no prior significant past medical  history other than arthritis, presented with cough, fever and dyspnea. He was hypoxic in the 80s on room air and was started on oxygen. The patient was admitted for treatment of his COPD and pneumonia.  1. Acute respiratory failure secondary to COPD exacerbation and possible bronchitis versus early pneumonia. On admission, cultures were negative. He was started on antibiotics, IV steroids, nebs and was on 4 liters oxygen. He had significant wheeze for several days into hospitalization, and his steroids were gradually tapered. His oxygen was tapered off yesterday, and he was breathing better on room air. This morning, his sats were more than 92% at rest and also on ambulation, so he is being discharged on prednisone taper, Levaquin to finish off his 7-day course and he was also started on Advair and Spiriva, though he did not have prior diagnosis of COPD. He is a pretty bad smoker and will need pulmonary function tests as an outpatient for diagnosis and staging. His course has been otherwise uneventful in the hospital.   DISCHARGE DISPOSITION: Home.   DISCHARGE CONDITION: Stable.   TIME SPENT ON DISCHARGE: 40 minutes.     ____________________________ Gladstone Lighter, MD rk:gb D: 10/28/2012 16:37:59 ET T: 10/29/2012 04:49:30 ET JOB#: [id number]  cc: Gladstone Lighter, MD, <Dictator> Eduard Clos. Gilford Rile, MD Charolette Forward, NP Gladstone Lighter MD ELECTRONICALLY SIGNED 10/29/2012 19:50

## 2015-01-13 NOTE — Discharge Summary (Signed)
PATIENT NAME:  Seth Perez, Seth Perez MR#:  [id number] DATE OF BIRTH:  10-22-1940  DATE OF ADMISSION:  11/18/2012 DATE OF DISCHARGE:  11/22/2012  CONSULTANTS:  Drs. Thornell Sartorius from cardiology.   PRIMARY CARE PHYSICIAN:  [facility] and patient saw Dr. Derrel Nip recently.   CHIEF COMPLAINT:  Shortness of breath.   DISCHARGE DIAGNOSES:   1.  Acute respiratory failure secondary to bilateral fairly central pulmonary emboli and chronic obstructive pulmonary disease exacerbation.  2.  Chronic diastolic congestive heart failure recently diagnosed.  3.  Elevated troponin likely from demand ischemia and pulmonary embolus burden.  4.  Arthritis.  5.  Recent chronic obstructive pulmonary disease flare and bronchitis.   DISCHARGE MEDICATIONS:  Levaquin 500 mg for 1 day, please take that tomorrow, prednisone taper 30 mg daily for 2 days, then 20 mg daily for 2 days, then 10 mg daily for 2 days and stop, Ventolin HFA 90 mcg inhaled 2 puffs 4 times a day as needed for wheezing, Xarelto 15 mg 2 times a day for 17 days then stop and take 20 mg daily, Spiriva 18 mcg daily inhaled 1 cap, Lasix 20 mg 1/2 tab once a day as needed for leg swelling, Advair 250/50 mcg inhaled 1 puff every 12 hours.   DIET:  Low sodium, low fat, low cholesterol.   ACTIVITY:  As tolerated.   FOLLOWUP:  Please follow with Dr. Derrel Nip as previously scheduled on Tuesday and follow with cardiology at [facility] clinic in 1 to 2 weeks.   DISPOSITION:  Home.   HISTORY OF PRESENT ILLNESS:  For full details of H and P, please see the dictation on November 18, 2012 by Dr. Bridgette Habermann, but briefly, this is a pleasant 74 year old male with longtime smoking who was recently hospitalized for COPD flare and bronchitis who per patient and his family was mostly sedentary after discharge and sitting on the chair and playing around on the internet for multiple hours a day who presented with acute respiratory failure with O2 saturation of 82% on room  air, tachycardia and tachypnea and was admitted to the hospitalist service.   SIGNIFICANT LABS AND IMAGING:  CT of chest for PE protocol on February 27th as above. Initial x-ray of the chest, one view, is showing no acute cardiopulmonary disease, mild hilar prominence. Initial creatinine was 1.5, BUN 18, sodium 138, potassium 4.4. Initial troponins were 0.73, then 0.7 and then 0.7. Initial CK-MB was 2.6 and then up to 4.8. Initial white count was 11.9, hemoglobin 13.7, platelets of 236. Blood cultures on admission were no growth to date. Initial ABG showed pH of 7.45, pCO2 of 27 and pO2 of 73. The last creatinine was 1.32.   HOSPITAL COURSE:  The patient was admitted to the hospitalist service and initially required BiPAP for the respiratory failure. The patient was persistently hypoxic and tachycardic and given the elevated troponin the following day, PE was entertained and patient was sent for a CAT scan that showed bilateral PE. The patient was also seen by cardiology, Dr. Fletcher Anon and Dr. Rockey Situ, while he was in the hospital. The troponins were trended and were stable. The CT scan did show bilateral PE. He was started on Xarelto as well as oxygen and treated for the COPD flare as initially he was wheezy as well. He was taken off of the BiPAP, placed on nasal cannula and improved with treatment for the COPD flare and Xarelto. Of note per my conversations with the family prior in this hospitalization  post previous discharge, he led more of a sedentary lifestyle and did not ambulate much and the PE is likely secondary to his prolonged immobility. Echocardiogram had shown normal EF with diastolic dysfunction on a previous admission and therefore, echo was not repeated here. Currently, blood cultures have been negative, his white blood cell count has normalized and he has ambulated today with physical therapy and he is not hypoxic. He does have some shortness of breath on exertion; however, but at this point, he  feels significantly improved. He did not require Lasix therapy and his chronic CHF which is diastolic in nature is stable at this point. At this point given the improvement in symptoms, he will be discharged. Of note, he was recently diagnosed with possible prostatitis and was started on Cipro and has taken it for 8 days prior. Here, he had been on Levaquin. He will be discharged on the day of Levaquin to cover the respiratory as well as the prostatitis. He should follow with his outpatient primary care for the followup of the prostatitis. He had no dysuria or significant urinary symptoms here.   CODE STATUS:  Full code.   TOTAL TIME SPENT:  40 minutes.    ____________________________ Vivien Presto, MD sa:si D: 11/22/2012 11:41:00 ET T: 11/22/2012 15:11:42 ET JOB#: [id number]  cc: Vivien Presto, MD, <Dictator> Deborra Medina, MD Vivien Presto MD ELECTRONICALLY SIGNED 11/30/2012 13:19

## 2015-01-13 NOTE — H&P (Signed)
PATIENT NAME:  Seth Perez, Seth Perez MR#:  [id number] DATE OF BIRTH:  [birth year]  DATE OF ADMISSION:  10/22/2012  PRIMARY CARE PHYSICIAN:  Nonlocal.  CHIEF COMPLAINT:  Shortness of breath with cough and fever.   HISTORY OF PRESENT ILLNESS:  The patient is a 74 year old Caucasian male with a past medical history of osteoarthritis, is presenting to the ER with a chief complaint of 2 day history of productive cough associated with shortness of breath.  Today patient's cough is worse.  He could not breathe and came into the ER.  He is reporting productive cough with whitish-yellow phlegm.  He denies any blood in his phlegm.  Complaining of intermittent episodes of midsternal chest pain only while coughing.  Denies any dizziness or loss of consciousness.  No similar complaints in the past.  No sick contacts.  Chest x-ray in the ER has revealed a right lower lobe pneumonia.  Nasal swab for influenza A and B are negative.  Troponin is detectable which is at 0.07.  A 12-lead EKG revealed no acute ST-T wave changes.  The patient's temperature was at 101.3 degrees Fahrenheit.  The patient was hypoxemic initially and his pulse ox was at 88%.  The patient was initially brought into the ER with CPAP.  Eventually oxygen was weaned off and patient was satting okay on nasal cannula.  The patient's wife was at bedside.  The patient also has reported that he does not have any heart attacks in the past.  In year 2008 patient had a stress test done for chest pain which was normal according to the patient.  He denies any other complaints.  The patient has received Lovenox 1 mg/kg subcutaneous x 1 in the ER.  Also, he has received levofloxacin 750 mg IV.   PAST MEDICAL HISTORY:  Complaining of knee and shoulder osteoarthritis.   PAST SURGICAL HISTORY:  Cholecystectomy, tonsillectomy, adenoidectomy.   ALLERGIES:  No known drug allergies.   MEDICATIONS:  Not on any home medications.   PSYCHOSOCIAL HISTORY:  Lives at home with  wife.  Smokes 1 pack a day, started at age 53.  Denies alcohol or illicit drug usage.   FAMILY HISTORY:  Brother deceased with lung cancer at age 29.  Dad deceased at age 61 from COPD and congestive heart failure.  Mom deceased with ruptured pancreas.   REVIEW OF SYSTEMS:  CONSTITUTIONAL:  Positive fever.  Complaining of weakness.  Denies any pain, weight loss or weight gain.  EYES:  Denies any blurry vision, glaucoma, cataracts, redness.  EARS, NOSE, THROAT:  Denies tinnitus, ear pain, discharge, snoring, postnasal drip.  RESPIRATORY:  Complaining of productive cough.  Complaining of shortness of breath, but denies any wheezing, hemoptysis.  CARDIOVASCULAR:  Mid sternal chest pain while coughing only.  Denies any orthopnea, palpitations, syncope.  GASTROINTESTINAL:  Denies nausea, vomiting, diarrhea, GERD.  GENITOURINARY:  Denies any dysuria, hematuria, renal calculi.  ENDOCRINE:  Denies polyuria, polyphagia, polydipsia, thyroid problems.  HEMATOLOGIC AND LYMPHATIC:  Denies anemia, easy bruising, bleeding.  INTEGUMENTARY:  Denies acne, rash, lesions.  MUSCULOSKELETAL:  Denies any pain in the neck, back, shoulder or hip pain.  NEUROLOGIC:  Denies any numbness, weakness, vertigo, ataxia.  PSYCHIATRIC:  Denies anxiety, insomnia, ADD, OCD.   PHYSICAL EXAMINATION: VITAL SIGNS:  Temperature 101.3 degrees Fahrenheit, pulse 96, respirations 20, blood pressure 112/62, pulse ox 90% to 94% on 2 liters.  GENERAL APPEARANCE:  Not under acute distress.  Answering questions appropriately, moderately built and moderately nourished.  HEENT:  Normocephalic, atraumatic.  Pupils are equally reacting to light and accommodation.  No scleral icterus.  Ear canals and tympanic membranes are intact.  No nasal congestion.  No sinus tenderness.  No pharyngeal edema.  NECK:  Is supple.  No JVD.  No thyromegaly.  LUNGS:  Moderate air entry.  No accessory muscle usage.  Right-sided crackles.  No wheezing.  No accessory  muscle usage.  No anterior chest wall tenderness on palpation.  CARDIAC:  S1, S2 normal.  Regular rate and rhythm.  No murmurs.  Point of maximal impulse is intact.  GASTROINTESTINAL:  Soft.  Bowel sounds are positive in all four quadrants.  Nontender, nondistended.  No masses felt.  NEUROLOGIC:  Awake, alert, oriented x 3.  Motor and sensory are grossly intact.  Reflexes are 2+.  SKIN:  Warm to touch.  Normal turgor.  No rashes.  No lesions.  EXTREMITIES:  Trace edema is present.  Chronic venous stasis were noticed.  No clubbing, cyanosis. MUSCULOSKELETAL:  No joint effusion.  No erythema.  No tenderness.  LABORATORY AND IMAGING STUDIES:  Chest x-ray has revealed a right lower lobe infiltrate.  A 12-lead EKG, initial EKG revealed sinus tachycardia but a repeat 12-lead EKG has revealed sinus tachycardia at 101 beats per minute, normal PR interval, no ST-T wave changes.  Normal QRS interval.  BNP 65, glucose 112, BUN 16, creatinine 0.87, sodium 139, potassium 4.0, chloride 108, CO2 24, GFR greater than 60, CK 135, CPK-MB 1.1, troponin T 0.07, WBC 10.3, hematocrit 45.3, platelet count 160,000.  PT 13.5, INR 1.0.  Influenza test is negative for A and B.   ASSESSMENT AND PLAN:  1.  Right lower lobe pneumonia, probably community-acquired.  Sputum culture and sensitivities ordered, levofloxacin 750 mg IV q. 24 hours, albuterol neb treatments as needed for shortness of breath.  2.  Chest pain with elevated troponin, probably pleuritic in nature versus sepsis.  We will rule out acute myocardial infarction with three sets of cardiac enzymes.  We will implement acute coronary syndrome protocol with oxygen, nitroglycerin, aspirin, statin and beta-blockers.  Cardiology consult will be considered if troponins are trending up.  Lovenox 1 mg/kg subcutaneous x 1 was given in the ER.  3.  Hypoxemia, resolved with oxygen.  This hypoxemia is most probably from pneumonia.  We will continue to monitor the patient.  4.   History of osteoarthritis.  We will provide pain management as needed basis.  5.  Deep vein thrombosis prophylaxis with Lovenox.  6.  GI prophylaxis with PPI  CODE STATUS:  THE PATIENT IS FULL CODE.  The plan of care discussed in detail with the patient.  He is aware of the plan.   TOTAL TIME SPENT ON ADMISSION:  60 minutes.    ____________________________ Nicholes Mango, MD ag:ea D: 10/22/2012 23:51:15 ET T: 10/23/2012 03:23:38 ET JOB#: [id number]  cc: Nicholes Mango, MD, <Dictator> Nicholes Mango MD ELECTRONICALLY SIGNED 10/26/2012 22:46

## 2015-01-14 NOTE — Discharge Summary (Signed)
PATIENT NAME:  Seth Perez, Seth Perez MR#:  [id number] DATE OF BIRTH:  1941-06-23  DATE OF ADMISSION:  01/21/2014 DATE OF DISCHARGE:  01/26/2014  ADMISSION DIAGNOSIS:  1.  Submassive pulmonary embolism with right heart strain.   DISCHARGE DIAGNOSES: 1.  Submassive pulmonary embolism with right heart strain.  2.  Right heart strain.  3.  Elevated troponin from demand ischemia.  4.  Benign prostatic hypertrophy.  5.  Chronic obstructive pulmonary disease.  6.  Atrial fibrillation, now in normal sinus rhythm.  7.  Tachycardia, sinus.   CONSULTATIONS:  Dr. Lucky Cowboy.  PROCEDURES: The patient underwent thrombolysis on 01/24/2014, as well as IVC filter.   PERTINENT LABORATORY AND DIAGNOSTICS: Factor V Leiden was negative. Protein S was normal. Protein C was low; however, it should be noted the patient had already been on Coumadin.   HOSPITAL COURSE:  A 74 year old male presented with shortness of breath and respiratory distress, who was found to have a submassive pulmonary  embolism on CT scan.  For further details, please refer to the H and P.   1.  Bilateral pulmonary embolism with right heart strain.  This is the secondary appearance of pulmonary emboli. He was followed by Dr. Lake Bells as an outpatient, and his PE had resolved, so he was taken off Xarelto.  His protein C was low; however, the patient was already on Coumadin when this was taken. Initially it was thought that the patient had these labs drawn on admission, but it does not appear to be so. His echo showed elevated pulmonary pressures with a normal ejection fraction. The patient underwent thrombolysis and IVC filter placed. His hemoglobin remained stable. He will need lifelong anticoagulation. He prefers Xarelto, which he was on in the past.  2.  Right heart strain for pulmonary embolism. Echo showed elevated pulmonary pressures from his pulmonary emboli.  3. Elevated troponin from demand ischemia from his pulmonary embolism.  4.  Benign  prostatic hypertrophy on Flomax.  5.  History of chronic obstructive pulmonary disease. The patient is continued on inhalers.  6.  Atrial fibrillation. The patient did have some episodes of atrial fibrillation due to his PE, but he has been in normal sinus rhythm for several days now.  7. Sinus tachycardia from his pulmonary emboli. This should resolve as the pulmonary emboli also improves.   DISCHARGE MEDICATIONS: 1.  Advair Diskus 250/50 b.i.d.  2.  Lasix 20 mg half tablet p.r.n. leg swelling.  3.  Spiriva 18 mcg daily. 4.  Ventolin 2 puffs 4 times a day.  5.  Flomax 0.4 mg daily.  6.  Xarelto 15 mg b.i.d. for 20 days, then 20 mg daily.   Discharge home with home health, physical therapy, nurse and nurse aide.   DISCHARGE DIET: Regular.   DISCHARGE ACTIVITY: No exertion or heavy lifting.   DISCHARGE FOLLOWUP:  The patient will follow up in 1 week with Dr. Lake Bells, the pulmonologist, and Dr. Lucky Cowboy in 2 weeks.  TIME SPENT: 35 minutes.   The patient is medically stable for discharge.   ____________________________ Carsyn Boster P. Benjie Karvonen, MD spm:dmm D: 01/26/2014 12:34:57 ET T: 01/26/2014 21:41:39 ET JOB#: [id number]  cc: Shereda Graw P. Benjie Karvonen, MD, <Dictator> Juanito Doom, MD Algernon Huxley, MD Donell Beers Calil Amor MD ELECTRONICALLY SIGNED 01/27/2014 14:15

## 2015-01-14 NOTE — Consult Note (Signed)
PATIENT NAME:  Seth Perez, Seth Perez MR#:  [id number] DATE OF BIRTH:  May 04, 1941  DATE OF CONSULTATION:  01/22/2014  CONSULTING PHYSICIAN:  Serafina Mitchell, MD  PRIMARY CARE PHYSICIAN: Dr. Deborra Medina.  REFERRING PHYSICIAN: Dr. Lunette Stands.  CHIEF COMPLAINT: Pulmonary embolism.  HISTORY OF PRESENT ILLNESS: This is a 74 year old gentleman who was admitted to the hospital on 01/21/2014 with shortness of breath. He had a CT scan of the chest which showed bilateral pulmonary embolism with multiple bilateral central and segmental pulmonary emboli with evidence of right heart strain. The patient was suffering from significant shortness of breath. The patient has a history of PE in the past and was on anticoagulations but this was stopped as he had completed its course. He does suffer from COPD with significant smoking history. He does take inhalers. He is on Lasix at home as well. He is no longer smoking. He states that without oxygen he gets very winded quickly. He is not on home oxygen.  REVIEW OF SYSTEMS: CONSTITUTIONAL: Generalized weakness. HEENT: Negative. RESPIRATORY: Positive for shortness of breath. CARDIOVASCULAR: No chest pain or palpitations.  All other review of systems are negative.  PAST MEDICAL HISTORY: 1. Recurrent pulmonary embolism. 2. Osteoarthritis. 3. Chronic obstructive pulmonary disease.  PAST SURGICAL HISTORY: Cholecystectomy, tonsillectomy, adenoidectomy.  ALLERGIES: None.  HOME MEDICATIONS: 1. Ventolin 2 puffs 4 times a day. 2. Spiriva 18 mcg once a day. 3. Lasix 10 mg once a day. 4. Flomax 0.4 mg once a day. 5. Advair Diskus 250 mcg q. 12 hours as needed.  SOCIAL HISTORY: Quit smoking about a year ago. Denies drinking or illicit drugs. He is married and lives with his wife.  FAMILY HISTORY: Positive for lung cancer in his brother. His father died from COPD and congestive heart failure. Mother died of a ruptured pancreas.  PHYSICAL EXAMINATION: VITALS SIGNS:  Temperature is 98.0. Pulse is 120. Blood pressure 93/67. O2 saturations are in the mid 90s on 3 liters. GENERAL: He is somewhat shortness of breath but in no acute distress. HEENT: Normocephalic, atraumatic. Sclerae are anicteric. NECK: Supple. PULMONARY: Respirations are somewhat labored. ABDOMEN: Soft. SKIN: No rashes. MUSCULOSKELETAL: Good range of motion.  LABORATORY DATA: CT angiogram shows bilateral multiple central and segmental pulmonary emboli with right heart strain.  Creatinine is 1.55. BNP 5981. Troponin 0.3. White blood cells count 11. Hemoglobin 15.8. Hematocrit 48.9. D-dimer 6116. INR 1.1.  ASSESSMENT: Bilateral pulmonary emboli with right heart strain.  PLAN: The patient has had a recurrent pulmonary embolism. Per report, lower extremity Doppler studies were negative for DVT. The source of his embolism is unknown at this time. Because of the right heart strain and shortness of breath, he would be a candidate for thrombolysis. This procedure can be done at [facility] on Monday or if he requires more immediate assistance, he would require transfer to [facility]. I discussed the details of the procedure with the patient. In the meantime, he will need to be on systemic anticoagulation. I will hold his Coumadin until after the procedure. He will need life-long anticoagulation given that this is a recurrent issue. He will also need to undergo a full hypercoagulable workup. We will continue to follow the patient while he is in the hospital.     ____________________________ Serafina Mitchell, MD vwb:lt D: 01/22/2014 22:21:57 ET T: 01/22/2014 23:05:23 ET JOB#: [id number]  cc: Serafina Mitchell, MD, <Dictator> Serafina Mitchell MD ELECTRONICALLY SIGNED 01/31/2014 20:11

## 2015-01-14 NOTE — Consult Note (Signed)
Brief Consult Note: Diagnosis: PE.   Consult note dictated.   Comments: Patient has a significant PE with left heart strain and SOB.  He will be scheduled for thrombolysis on Monday.  If this needs to be done sooner, he will need transfer to [facility].  Otherwise it will be done at [facility] on Monday.  Continue anticoagulation.  Hold coumadin until after proceedure.  He needs to be NPO after midnight on Sunday.  Electronic Signatures: Serafina Mitchell (MD)  (Signed [phone] 22:05)  Authored: Brief Consult Note   Last Updated: 02-May-15 22:05 by Serafina Mitchell (MD)

## 2015-01-14 NOTE — H&P (Signed)
PATIENT NAME:  Seth Perez, Seth Perez MR#:  [id number] DATE OF BIRTH:  1941/08/31  DATE OF ADMISSION:  01/21/2014  PRIMARY CARE PHYSICIAN: Dr. Deborra Medina.   REFERRING PHYSICIAN: Dr. Lavonia Drafts.   CHIEF COMPLAINT: Shortness of breath.   HISTORY OF PRESENT ILLNESS: Seth Perez is a 74 year old pleasant male with a past medical history of pulmonary embolism diagnosed in February 2014, osteoarthritis was treated with anticoagulation and was taken off of anticoagulation after the treatment. The patient had some episodes of shortness of breath at which time the patient had underwent CT of the chest which was negative for any PE. The patient started to experience severe shortness of breath for the last 2 days with decreased energy level. Concerning this, came to the Emergency Department. The patient was hypoxic with oxygen saturations in 80s, tachycardic. The patient underwent a CT of the chest, which showed bilateral pulmonary embolism, multiple bilateral central and  segmental pulmonary embolism with evidence of right heart strain. The patient is experiencing shortness of breath even with talking. Denies having any fever. Denies having any nausea, vomiting.   PAST MEDICAL HISTORY:  1. Recurrent pulmonary embolism.  2. Osteoarthritis.  3. Chronic obstructive pulmonary disease,   PAST SURGICAL HISTORY:  1. Cholecystectomy.  2. Tonsillectomy.  3. Adenoidectomy.   ALLERGIES: No known drug allergies.   HOME MEDICATIONS:  1. Ventolin 2 puffs 4 times a day.  2. Spiriva 18 mcg once a day.  3. Lasix 10 mg once a day.  4. Flomax 0.4 mg once a day.  5. Advair Diskus 250 mcg every 12 as needed.   SOCIAL HISTORY: Quit smoking about a year back. Denies drinking alcohol or using illicit drugs. Married, lives with his wife.   FAMILY HISTORY: Brother deceased of lung cancer, father died from COPD and congestive heart failure. Mother died of ruptured pancreas.   REVIEW OF SYSTEMS: CONSTITUTIONAL:  Experiencing severe generalized weakness.  EYES: No change in vision.  ENT: No change in living. No sore throat.  RESPIRATORY: Has shortness of breath.  CARDIOVASCULAR: No chest pain, palpations.  GASTROINTESTINAL: No nausea, vomiting, abdominal pain.  GENITOURINARY: No dysuria or hematuria.  HEMATOLOGIC:  ENDOCRINE: No polyuria or polydipsia.  HEMATOLOGIC: No easy bruising or bleeding.  SKIN: No rash or lesions.  MUSCULOSKELETAL: No joint pains and aches.  NEUROLOGIC: No weakness or numbness in any part of the body.   PHYSICAL EXAMINATION:  GENERAL: This is a well-built, well-nourished, age-appropriate male lying down in the bed, not in distress.  VITAL SIGNS: Temperature 98, pulse 120, blood pressure 93/67, respiratory rate of 16, oxygen saturation is 93% on 2 liters of oxygen.  HEENT: Head normocephalic, atraumatic. No scleral icterus. Conjunctivae normal. Pupils equal and react to light. Extraocular movements are intact. Mucous membranes moist. No pharyngeal erythema.  NECK: Supple. No lymphadenopathy. No JVD. No carotid bruit.  CHEST: Has no focal tenderness. Bilateral clear to auscultation.  HEART: S1, S2, regular, tachycardia. Has 1 to 2+ pitting edema in both lower extremities. Pulses 2+. ABDOMEN: Bowel sounds present. Soft, nontender, nondistended. No hepatosplenomegaly.  SKIN: No rash or lesions.  MUSCULOSKELETAL: Good range of motion in all the extremities.   LABORATORY DATA: CT angiogram bilateral multiple central and segmental pulmonary emboli with evidence of right heart strain.   CK-MB of 2.6. Troponin 0.31. CMP: BUN 14, creatinine of  CBC: WBC of 11.3, hemoglobin 15.8, platelet count of 167,000.   ASSESSMENT AND PLAN: Bilateral pulmonary embolism with clot burden with right heart strain. Start  the patient on heparin drip as well as Coumadin. We will obtain echocardiogram. We will give 1 liter fluid bolus. Will send for hypercoagulable workup. No obvious signs of any  malignancy. Patient had a recent colonoscopy with 18 polyps removed. Lungs do not show any other malignancy. Patient lives with some sedentary lifestyle. However, this clot burden started in his 2s. Unlikely any genetic predisposition. However, will obtain the labs. Considering the patient's recurrent pulmonary emboli, the patient will need to be on lifelong anticoagulation. The patient is on therapeutic dose of heparin.  1. Acute renal insufficiency. Could be from the acute tubular necrosis from the low blood pressure. Continue with IV fluids and follow up.  2. Chronic obstructive pulmonary disease. No current issues.    ____________________________ Monica Becton, MD pv:lt D: 01/22/2014 00:25:44 ET T: 01/22/2014 02:35:49 ET JOB#: [id number]  cc: Monica Becton, MD, <Dictator> Monica Becton MD ELECTRONICALLY SIGNED 01/24/2014 21:01

## 2015-01-14 NOTE — Op Note (Signed)
PATIENT NAME:  Seth Perez, Seth Perez MR#:  [id number] DATE OF BIRTH:  08-12-1941  DATE OF PROCEDURE:  01/24/2014  PREOPERATIVE DIAGNOSES: 1. Submassive pulmonary embolus with right heart strain.  2. Recurrent pulmonary embolus despite appropriate previous treatment.  3. Ultrasound guidance for vascular access to bilateral femoral veins.   POSTOPERATIVE DIAGNOSES:  1. Submassive pulmonary embolus with right heart strain.  2. Recurrent pulmonary embolus despite appropriate previous treatment.  3. Ultrasound guidance for vascular access to bilateral femoral veins.   PROCEDURE: 1. Ultrasound guidance for vascular access to right femoral veins.  2. Catheter placement into bilateral pulmonary arteries from right femoral venous approach.  3. Inferior venacavogram and bilateral pulmonary angiograms.  4. Infusion for thrombolysis to bilateral pulmonary arteries with 5 mg of tPA instilled into each pulmonary artery.  5. Inferior vena cava filter placement.   SURGEON: Algernon Huxley, M.D.   ANESTHESIA: Local with moderate conscious sedation.   ESTIMATED BLOOD LOSS: Minimal.   FLUOROSCOPY TIME: Approximately four minutes.   CONTRAST USED: 45 mL.   INDICATION FOR PROCEDURE: A 74 year old male with recurrent pulmonary embolus and on this episode there is evidence of right heart strain from submassive PE. To address the large amount of pulmonary embolus we are asked to consider pulmonary lysis. Risks and benefits of that were discussed. It was also recommended that a filter be placed as part of the pulmonary lysis to avoid any further embolization which could be fatal following the procedure, as well as due to failure and multiple recurrent PEs. It was also recommended he maintain on lifetime anticoagulation following the procedure. Risks and benefits were discussed. Informed consent was obtained.   DESCRIPTION OF PROCEDURE: The patient is brought to the vascular suite. Groins were shaved and prepped and a  sterile surgical field was created. The right femoral vein was visualized with ultrasound and found to be widely patent. It was then accessed under direct ultrasound guidance without difficulty with a Seldinger needle. A J-wire was then placed. A pigtail catheter was placed into the mid- inferior vena cava and an inferior venacavogram was performed, which showed the level of the renal veins at the L1-L2 interspace. We also made the picture such that it showed the pulmonary outflow tract and right atrium. I then took a JR-4 catheter over the wire and advanced it through the inferior vena cava into the right atrium and then out the right ventricle and pulmonary artery. I initially accessed the left pulmonary artery as that is where the catheter initially was inclined to go. Imaging for which did show thrombosis within the left pulmonary artery, 5 mg of tPA was then instilled with JR-5 catheter and the main and secondary branches of the left pulmonary artery. I then pulled back the catheter and accessed the right pulmonary artery, similarly we deployed 5 mg of TPA in the main and primary branches of the right pulmonary artery, having instilled a total of 10 mg of tPA, 5  in each pulmonary artery. I felt we had addressed the situation with a reasonably low risk of bleeding, but a high concentration of local,  tPA was instilled for thrombolysis. I then placed the inferior vena cava filter in the middle of L2 below the renal veins, after exchanging for the delivery sheath. The sheath was then removed. Pressure was held and sterile dressing was placed. The patient tolerated the procedure well and was taken to the recovery room in stable condition.     ____________________________ Algernon Huxley, MD  jsd:sg D: 01/24/2014 11:39:51 ET T: 01/24/2014 11:55:17 ET JOB#: [id number]  cc: Algernon Huxley, MD, <Dictator> Algernon Huxley MD ELECTRONICALLY SIGNED 01/27/2014 14:25

## 2015-02-09 ENCOUNTER — Telehealth: Payer: Self-pay | Admitting: Pulmonary Disease

## 2015-02-09 MED ORDER — RIVAROXABAN 20 MG PO TABS
20.0000 mg | ORAL_TABLET | Freq: Every day | ORAL | Status: DC
Start: 2015-02-09 — End: 2015-05-31

## 2015-02-09 NOTE — Telephone Encounter (Signed)
Spoke with pt and advised that refill for Xarelto was sent.  According to BQ last ov note pt was due for repeat chest ct and f/u in 10/2014.  Pt states he had CT at [facility] in 10/2014.  I had results faxed and given to [facility] for BQ review.  Also scheduled f/u with Dr Lake Bells to discuss.

## 2015-02-14 ENCOUNTER — Encounter: Payer: Self-pay | Admitting: Pulmonary Disease

## 2015-02-14 ENCOUNTER — Ambulatory Visit (INDEPENDENT_AMBULATORY_CARE_PROVIDER_SITE_OTHER): Payer: Medicare Other | Admitting: Pulmonary Disease

## 2015-02-14 VITALS — BP 132/76 | HR 98 | Ht 75.5 in | Wt 246.0 lb

## 2015-02-14 DIAGNOSIS — J432 Centrilobular emphysema: Secondary | ICD-10-CM

## 2015-02-14 DIAGNOSIS — I2699 Other pulmonary embolism without acute cor pulmonale: Secondary | ICD-10-CM

## 2015-02-14 DIAGNOSIS — R911 Solitary pulmonary nodule: Secondary | ICD-10-CM

## 2015-02-14 NOTE — Patient Instructions (Signed)
Continue taking the Xarelto and the Spiriva. We will see you back in one year or sooner if needed

## 2015-02-14 NOTE — Progress Notes (Signed)
  Subjective:    Patient ID: Seth Roche., male    DOB: [birth year], 74 y.o.   MRN: [id number]  Synopsis: Seth Perez first saw the [facility] pulmonary clinic in March of 2014 for evaluation of pulmonary embolism, a pulmonary nodule and COPD. He had moderate obstruction on pulmonary function testing and was prescribed Spiriva. He had a provoked pulmonary embolism in late February 2014 and was treated for this with Xarelto. He also had a 1.2 cm pleural-based nodule in the right upper lobe.  In may of 2015 he had a repeat large pulmonary embolism requiring thrombolytic therapy and an IVC filter placement.  HPI Chief Complaint  Patient presents with  . Follow-up    pt has no breathing complaints at this time.  review most recent ct chest per pt.       He has been doing well sinc ethe last visit.  No dyspnea, no bronchitis, no hospitalizations, no bleeding.  He is taking the Spiriva and Xarelto every day.   Past Medical History  Diagnosis Date  . Arthritis   . Chicken pox   . COPD (chronic obstructive pulmonary disease)   . Pulmonary emboli     on Xarelto     Review of Systems  Constitutional: Negative for fever, chills and fatigue.  HENT: Negative for postnasal drip, sinus pressure and sneezing.   Respiratory: Negative for cough, shortness of breath and wheezing.   Cardiovascular: Negative for chest pain, palpitations and leg swelling.       Objective:   Physical Exam  Filed Vitals:   02/14/15 1406  BP: 132/76  Pulse: 98  Height: 6' 3.5" (1.918 m)  Weight: 246 lb (111.585 kg)  SpO2: 95%  RA  Gen: well appearing, no acute distress HEENT: NCAT, EOMi, OP clear, PULM: CTA B CV: RRR, no mgr, no JVD AB: BS+, soft, nontender,  MSK: warm, chronic swelling decreased      Assessment & Plan:   COPD (chronic obstructive pulmonary disease) with emphysema This has been a stable interval.   Plan: Continue Spiriva. Get a flu shot in the fall. Follow-up in one  year.   Solitary pulmonary nodule The nodules did not change on the most recent CT chest. He needs no further imaging. We will consider low-dose cancer screening CT next year.    Recurrent pulmonary embolism No complications from Xarelto.   Plan: Continue Xarelto for life     Updated Medication List Outpatient Encounter Prescriptions as of 02/14/2015  Medication Sig  . albuterol (VENTOLIN HFA) 108 (90 BASE) MCG/ACT inhaler Inhale 2 puffs into the lungs every 6 (six) hours as needed for wheezing.  . rivaroxaban (XARELTO) 20 MG TABS tablet Take 1 tablet (20 mg total) by mouth daily with supper.  . tamsulosin (FLOMAX) 0.4 MG CAPS Take 0.4 mg by mouth daily.  Marland Kitchen tiotropium (SPIRIVA HANDIHALER) 18 MCG inhalation capsule Place 1 capsule (18 mcg total) into inhaler and inhale daily.  . [DISCONTINUED] furosemide (LASIX) 20 MG tablet TAKE 1/2 TABLET BY MOUTH EVERY MORNING AS NEEDED FOR LEG SWELLING (Patient not taking: Reported on 02/14/2015)   No facility-administered encounter medications on file as of 02/14/2015.

## 2015-02-14 NOTE — Assessment & Plan Note (Signed)
The nodules did not change on the most recent CT chest. He needs no further imaging. We will consider low-dose cancer screening CT next year.

## 2015-02-14 NOTE — Assessment & Plan Note (Signed)
This has been a stable interval.   Plan: Continue Spiriva. Get a flu shot in the fall. Follow-up in one year.

## 2015-02-14 NOTE — Assessment & Plan Note (Signed)
No complications from Xarelto.   Plan: Continue Xarelto for life

## 2015-05-31 ENCOUNTER — Other Ambulatory Visit: Payer: Self-pay

## 2015-05-31 MED ORDER — RIVAROXABAN 20 MG PO TABS: 20.0000 mg | ORAL_TABLET | Freq: Every day | ORAL | Status: DC

## 2015-07-12 ENCOUNTER — Telehealth: Payer: Self-pay | Admitting: Pulmonary Disease

## 2015-07-12 NOTE — Telephone Encounter (Signed)
Spoke with pt, states he is requesting a rx for xarelto with refills on it as he does not like having to call our office monthly for his rx on a maintenance med.  I advised that on the rx I sent last month had 5 refills on it and he should be able to pick this up from his pharmacy.  Pt expressed understanding.  Nothing further needed.

## 2015-07-28 ENCOUNTER — Telehealth: Payer: Self-pay | Admitting: *Deleted

## 2015-07-28 DIAGNOSIS — I824Z9 Acute embolism and thrombosis of unspecified deep veins of unspecified distal lower extremity: Secondary | ICD-10-CM | POA: Diagnosis not present

## 2015-07-28 DIAGNOSIS — I824Y9 Acute embolism and thrombosis of unspecified deep veins of unspecified proximal lower extremity: Secondary | ICD-10-CM | POA: Diagnosis not present

## 2015-07-28 NOTE — Telephone Encounter (Signed)
Patient was concerned if he should come in for visit, he stated that he hasn't been seen by Dr. Derrel Nip in awhile. Patient was concered if he should come in. Please advise

## 2015-07-28 NOTE — Telephone Encounter (Signed)
Left message for patient to return call. Per Dr. Derrel Nip schedule patient for a CPE with her 30 minute slot and a AWV with Denisa.

## 2015-08-03 ENCOUNTER — Other Ambulatory Visit: Payer: Self-pay | Admitting: *Deleted

## 2015-08-03 ENCOUNTER — Ambulatory Visit (INDEPENDENT_AMBULATORY_CARE_PROVIDER_SITE_OTHER): Payer: Medicare Other

## 2015-08-03 VITALS — BP 120/64 | HR 64 | Temp 97.4°F | Resp 15 | Ht 75.5 in | Wt 256.4 lb

## 2015-08-03 DIAGNOSIS — Z23 Encounter for immunization: Secondary | ICD-10-CM

## 2015-08-03 DIAGNOSIS — Z0000 Encounter for general adult medical examination without abnormal findings: Secondary | ICD-10-CM

## 2015-08-03 NOTE — Patient Instructions (Addendum)
Seth Perez,  Thank you for taking time to come for your Medicare Wellness Visit.  I appreciate your ongoing commitment to your health goals. Please review the following plan we discussed and let me know if I can assist you in the future.  Influenza vaccine administered today.  Bring a copy of advanced directives to upcoming scheduled appointment.  Return in 1 year for annual medicare wellness with Health Coach.    Fall Prevention in the Home  Falls can cause injuries. They can happen to people of all ages. There are many things you can do to make your home safe and to help prevent falls.  WHAT CAN I DO ON THE OUTSIDE OF MY HOME?  Regularly fix the edges of walkways and driveways and fix any cracks.  Remove anything that might make you trip as you walk through a door, such as a raised step or threshold.  Trim any bushes or trees on the path to your home.  Use bright outdoor lighting.  Clear any walking paths of anything that might make someone trip, such as rocks or tools.  Regularly check to see if handrails are loose or broken. Make sure that both sides of any steps have handrails.  Any raised decks and porches should have guardrails on the edges.  Have any leaves, snow, or ice cleared regularly.  Use sand or salt on walking paths during winter.  Clean up any spills in your garage right away. This includes oil or grease spills. WHAT CAN I DO IN THE BATHROOM?   Use night lights.  Install grab bars by the toilet and in the tub and shower. Do not use towel bars as grab bars.  Use non-skid mats or decals in the tub or shower.  If you need to sit down in the shower, use a plastic, non-slip stool.  Keep the floor dry. Clean up any water that spills on the floor as soon as it happens.  Remove soap buildup in the tub or shower regularly.  Attach bath mats securely with double-sided non-slip rug tape.  Do not have throw rugs and other things on the floor that can make you  trip. WHAT CAN I DO IN THE BEDROOM?  Use night lights.  Make sure that you have a light by your bed that is easy to reach.  Do not use any sheets or blankets that are too big for your bed. They should not hang down onto the floor.  Have a firm chair that has side arms. You can use this for support while you get dressed.  Do not have throw rugs and other things on the floor that can make you trip. WHAT CAN I DO IN THE KITCHEN?  Clean up any spills right away.  Avoid walking on wet floors.  Keep items that you use a lot in easy-to-reach places.  If you need to reach something above you, use a strong step stool that has a grab bar.  Keep electrical cords out of the way.  Do not use floor polish or wax that makes floors slippery. If you must use wax, use non-skid floor wax.  Do not have throw rugs and other things on the floor that can make you trip. WHAT CAN I DO WITH MY STAIRS?  Do not leave any items on the stairs.  Make sure that there are handrails on both sides of the stairs and use them. Fix handrails that are broken or loose. Make sure that handrails are as  long as the stairways.  Check any carpeting to make sure that it is firmly attached to the stairs. Fix any carpet that is loose or worn.  Avoid having throw rugs at the top or bottom of the stairs. If you do have throw rugs, attach them to the floor with carpet tape.  Make sure that you have a light switch at the top of the stairs and the bottom of the stairs. If you do not have them, ask someone to add them for you. WHAT ELSE CAN I DO TO HELP PREVENT FALLS?  Wear shoes that:  Do not have high heels.  Have rubber bottoms.  Are comfortable and fit you well.  Are closed at the toe. Do not wear sandals.  If you use a stepladder:  Make sure that it is fully opened. Do not climb a closed stepladder.  Make sure that both sides of the stepladder are locked into place.  Ask someone to hold it for you, if  possible.  Clearly mark and make sure that you can see:  Any grab bars or handrails.  First and last steps.  Where the edge of each step is.  Use tools that help you move around (mobility aids) if they are needed. These include:  Canes.  Walkers.  Scooters.  Crutches.  Turn on the lights when you go into a dark area. Replace any light bulbs as soon as they burn out.  Set up your furniture so you have a clear path. Avoid moving your furniture around.  If any of your floors are uneven, fix them.  If there are any pets around you, be aware of where they are.  Review your medicines with your doctor. Some medicines can make you feel dizzy. This can increase your chance of falling. Ask your doctor what other things that you can do to help prevent falls.   This information is not intended to replace advice given to you by your health care provider. Make sure you discuss any questions you have with your health care provider.   Document Released: 07/06/2009 Document Revised: 01/24/2015 Document Reviewed: 10/14/2014 Elsevier Interactive Patient Education [employer].

## 2015-08-03 NOTE — Progress Notes (Signed)
Subjective:   Seth Bales Sr. is a 74 y.o. male who presents for an Initial Medicare Annual Wellness Visit.  Review of Systems  No ROS.  Medicare Wellness Visit. Cardiac Risk Factors include: advanced age (>52men, >45 women);male gender    Objective:    Today's Vitals   08/03/15 1056  BP: 120/64  Pulse: 64  Temp: 97.4 F (36.3 C)  TempSrc: Oral  Resp: 15  Height: 6' 3.5" (1.918 m)  Weight: 256 lb 6.4 oz (116.302 kg)  SpO2: 97%    Current Medications (verified) Outpatient Encounter Prescriptions as of 08/03/2015  Medication Sig  . rivaroxaban (XARELTO) 20 MG TABS tablet Take 1 tablet (20 mg total) by mouth daily with supper.  . tiotropium (SPIRIVA HANDIHALER) 18 MCG inhalation capsule Place 1 capsule (18 mcg total) into inhaler and inhale daily.  Marland Kitchen albuterol (VENTOLIN HFA) 108 (90 BASE) MCG/ACT inhaler Inhale 2 puffs into the lungs every 6 (six) hours as needed for wheezing.  . tamsulosin (FLOMAX) 0.4 MG CAPS Take 0.4 mg by mouth daily.   No facility-administered encounter medications on file as of 08/03/2015.    Allergies (verified) Review of patient's allergies indicates no known allergies.   History: Past Medical History  Diagnosis Date  . Arthritis   . Chicken pox   . COPD (chronic obstructive pulmonary disease) ([place])   . Pulmonary emboli ([place])     on Xarelto   Past Surgical History  Procedure Laterality Date  . Cholecystectomy  2008  . Tonsillectomy and adenoidectomy  1947   Family History  Problem Relation Age of Onset  . Arthritis Mother   . Lung cancer Brother     was a smoker  . Breast cancer Maternal Grandmother   . Heart disease Maternal Grandfather   . Heart disease Paternal Grandfather   . Asthma Father   . Emphysema Father    Social History   Occupational History  . Not on file.   Social History Main Topics  . Smoking status: Former Smoker -- 1.00 packs/day for 50 years    Types: Cigarettes    Quit date: 10/12/2012  .  Smokeless tobacco: Never Used  . Alcohol Use: 1.0 oz/week    2 Standard drinks or equivalent per week  . Drug Use: No  . Sexual Activity: Not Currently   Tobacco Counseling Counseling given: Not Answered   Activities of Daily Living In your present state of health, do you have any difficulty performing the following activities: 08/03/2015  Hearing? Y  Vision? N  Difficulty concentrating or making decisions? N  Walking or climbing stairs? Y  Dressing or bathing? N  Doing errands, shopping? N  Preparing Food and eating ? N  Using the Toilet? N  In the past six months, have you accidently leaked urine? N  Do you have problems with loss of bowel control? N  Managing your Medications? N  Managing your Finances? N  Housekeeping or managing your Housekeeping? N    Immunizations and Health Maintenance Immunization History  Administered Date(s) Administered  . Influenza Split 06/17/2014  . Influenza,inj,Quad PF,36+ Mos 08/03/2015  . Pneumococcal Conjugate-13 06/17/2014  . Pneumococcal Polysaccharide-23 06/18/2007  . Pneumococcal-Unspecified 09/28/2006  . Tdap 06/23/2014   There are no preventive care reminders to display for this patient.  Patient Care Team: [name], MD as PCP - General (Internal Medicine)  Indicate any recent Medical Services you may have received from other than [facility] providers in the past year (date may  be approximate).    Assessment:   This is a routine wellness examination for Seth Perez. The goal of the wellness visit is to assist the patient how to close the gaps in care and create a preventative care plan for the patient.   Osteoporosis risk discussed.  Taking meds without issues; no barriers identified.    Safety issues reviewed; smoke detectors in the home. Firearms locked in a secure area. Wears seatbelts when driving or riding with others. No violence in the home.  No identified risk were noted; The patient was oriented x 3; appropriate  in dress and manner and no objective failures at ADL's or IADL's.   Influenza vaccine administered today.  ZOSTAVAX postponed, per patient request.   Hearing loss discussed; worse in L ear.  Declined referral to ENT at this time.  Encouraged to follow up with PCP as needed.  Patient concerns:  Chronic L shoulder pain (achy) which radiates down the arm to hand.  L hand numb at times.  Deferred to follow up with PCP at upcoming appointment.     Hearing/Vision screen Hearing Screening Comments: Audiometry declined.  Does not wear hearing aids as often as needed. Vision Screening Comments: Followed by [facility] Wears glasses Declined eye screening.  Upcoming appointment.  Dietary issues and exercise activities discussed: Current Exercise Habits:: The patient does not participate in regular exercise at present  Goals    . Increase physical activity     Currently walks around the block once a week.  Patient centered goal is to increase walking around the block up to 3 times a week.    . Increase water intake     Currently does not drink much more than a sip of water.  Patient centered is to increase water intake up to 2 bottle daily =4cups daily.      Depression Screen PHQ 2/9 Scores 08/03/2015 11/02/2012  PHQ - 2 Score 0 0    Fall Risk Fall Risk  08/03/2015 11/02/2012  Falls in the past year? Yes No  Number falls in past yr: 2 or more -  Injury with Fall? No -  Follow up Education provided;Falls prevention discussed;Falls evaluation completed -    Cognitive Function: MMSE - Mini Mental State Exam 08/03/2015  Orientation to time 5  Orientation to Place 5  Registration 3  Attention/ Calculation 5  Recall 3  Language- name 2 objects 2  Language- repeat 1  Language- follow 3 step command 3  Language- read & follow direction 1  Write a sentence 1  Copy design 1  Total score 30    Screening Tests Health Maintenance  Topic Date Due  . ZOSTAVAX   08/02/2016 (Originally 03/26/2000)  . INFLUENZA VACCINE  04/23/2016  . COLONOSCOPY  08/18/2023  . TETANUS/TDAP  06/23/2024  . PNA vac Low Risk Adult  Completed        Plan:    End of life planning was discussed; aging in home or other; Advanced aging; Copy requested of completed advanced directive forms.   Return 1 week before scheduled physical for fasting labs.  During the course of the visit Seth Perez was educated and counseled about the following appropriate screening and preventive services:   Vaccines to include Pneumoccal, Influenza, Hepatitis B, Td, Zostavax, HCV  Electrocardiogram  Colorectal cancer screening  Cardiovascular disease screening  Diabetes screening  Glaucoma screening  Nutrition counseling  Prostate cancer screening  Smoking cessation counseling  Patient Instructions (the written plan) were  given to the patient.   Varney Biles, LPN   [phone]

## 2015-08-03 NOTE — Progress Notes (Signed)
  I have reviewed the above information and agree with above.   Necole Minassian, MD 

## 2015-08-07 ENCOUNTER — Other Ambulatory Visit: Payer: Self-pay | Admitting: Pulmonary Disease

## 2015-08-09 DIAGNOSIS — I824Z9 Acute embolism and thrombosis of unspecified deep veins of unspecified distal lower extremity: Secondary | ICD-10-CM | POA: Diagnosis not present

## 2015-08-09 DIAGNOSIS — I824Y9 Acute embolism and thrombosis of unspecified deep veins of unspecified proximal lower extremity: Secondary | ICD-10-CM | POA: Diagnosis not present

## 2015-08-09 DIAGNOSIS — I2699 Other pulmonary embolism without acute cor pulmonale: Secondary | ICD-10-CM | POA: Diagnosis not present

## 2015-08-24 ENCOUNTER — Other Ambulatory Visit (INDEPENDENT_AMBULATORY_CARE_PROVIDER_SITE_OTHER): Payer: Medicare Other

## 2015-08-24 DIAGNOSIS — Z0000 Encounter for general adult medical examination without abnormal findings: Secondary | ICD-10-CM | POA: Diagnosis not present

## 2015-08-24 LAB — LIPID PANEL
CHOLESTEROL: 187 mg/dL (ref 0–200)
HDL: 34.1 mg/dL — AB (ref >39.00)
LDL CALC: 127 mg/dL — AB (ref 0–99)
NonHDL: 153.34
Total CHOL/HDL Ratio: 5
Triglycerides: 131 mg/dL (ref 0.0–149.0)
VLDL: 26.2 mg/dL (ref 0.0–40.0)

## 2015-08-24 LAB — COMPREHENSIVE METABOLIC PANEL
ALBUMIN: 4.1 g/dL (ref 3.5–5.2)
ALT: 19 U/L (ref 0–53)
AST: 19 U/L (ref 0–37)
Alkaline Phosphatase: 67 U/L (ref 39–117)
BUN: 14 mg/dL (ref 6–23)
CHLORIDE: 103 meq/L (ref 96–112)
CO2: 29 mEq/L (ref 19–32)
Calcium: 9.5 mg/dL (ref 8.4–10.5)
Creatinine, Ser: 0.97 mg/dL (ref 0.40–1.50)
GFR: 80.1 mL/min (ref >60.00)
GLUCOSE: 85 mg/dL (ref 70–99)
POTASSIUM: 4.2 meq/L (ref 3.5–5.1)
SODIUM: 139 meq/L (ref 135–145)
Total Bilirubin: 0.6 mg/dL (ref 0.2–1.2)
Total Protein: 7.2 g/dL (ref 6.0–8.3)

## 2015-08-24 LAB — CBC WITH DIFFERENTIAL/PLATELET
BASOS PCT: 0.4 % (ref 0.0–3.0)
Basophils Absolute: 0 10*3/uL (ref 0.0–0.1)
EOS PCT: 2.5 % (ref 0.0–5.0)
Eosinophils Absolute: 0.2 10*3/uL (ref 0.0–0.7)
HCT: 44.2 % (ref 39.0–52.0)
Hemoglobin: 14.7 g/dL (ref 13.0–17.0)
LYMPHS ABS: 3.3 10*3/uL (ref 0.7–4.0)
Lymphocytes Relative: 36.7 % (ref 12.0–46.0)
MCHC: 33.4 g/dL (ref 30.0–36.0)
MCV: 89.8 fl (ref 78.0–100.0)
MONO ABS: 0.8 10*3/uL (ref 0.1–1.0)
Monocytes Relative: 8.4 % (ref 3.0–12.0)
NEUTROS PCT: 52 % (ref 43.0–77.0)
Neutro Abs: 4.7 10*3/uL (ref 1.4–7.7)
Platelets: 202 10*3/uL (ref 150.0–400.0)
RBC: 4.92 Mil/uL (ref 4.22–5.81)
RDW: 12.9 % (ref 11.5–15.5)
WBC: 9.1 10*3/uL (ref 4.0–10.5)

## 2015-08-30 ENCOUNTER — Ambulatory Visit (INDEPENDENT_AMBULATORY_CARE_PROVIDER_SITE_OTHER): Payer: Medicare Other | Admitting: Internal Medicine

## 2015-08-30 ENCOUNTER — Encounter: Payer: Self-pay | Admitting: Internal Medicine

## 2015-08-30 VITALS — BP 124/70 | HR 75 | Temp 98.2°F | Resp 12 | Ht 75.0 in | Wt 239.8 lb

## 2015-08-30 DIAGNOSIS — Z125 Encounter for screening for malignant neoplasm of prostate: Secondary | ICD-10-CM

## 2015-08-30 DIAGNOSIS — I2699 Other pulmonary embolism without acute cor pulmonale: Secondary | ICD-10-CM | POA: Diagnosis not present

## 2015-08-30 DIAGNOSIS — Z0000 Encounter for general adult medical examination without abnormal findings: Secondary | ICD-10-CM | POA: Diagnosis not present

## 2015-08-30 MED ORDER — TAMSULOSIN HCL 0.4 MG PO CAPS: 0.4000 mg | ORAL_CAPSULE | Freq: Every day | ORAL | Status: DC

## 2015-08-30 NOTE — Assessment & Plan Note (Signed)
Done by Dr Jacqlyn Larsen with DRE and PSA

## 2015-08-30 NOTE — Patient Instructions (Signed)

## 2015-08-30 NOTE — Progress Notes (Signed)
Patient ID: Seth Perez., male    DOB: [birth year]  Age: 74 y.o. MRN: [id number]  The patient is here for annual  Physical  examination and management of other chronic and acute problems.  Last seen Sept 2015 , Seth Perez's patient,  Seth Perez he had a CT scan Feb 2016 for follow up on pulmonary nodules, not showing up in chart.   recent fasting labs reviewed and all are normal.     The risk factors are reflected in the social history.  The roster of all physicians providing medical care to patient - is listed in the Snapshot section of the chart.  Activities of daily living:  The patient is 100% independent in all ADLs: dressing, toileting, feeding as well as independent mobility     Home safety : The patient has smoke detectors in the home. They wear seatbelts.  There are no firearms at home. There is no violence in the home.   There is no risks for hepatitis, STDs or HIV. There is no   history of blood transfusion. They have no travel history to infectious disease endemic areas of the world.  The patient has seen their dentist in the last six month. They have seen their eye doctor in the last year. They admit to  Moderate hearing difficulty  And is saving money fornew set of hearing aids   They have deferred audiologic testing in the last year.  They do not  have excessive sun exposure. Discussed the need for sun protection: hats, long sleeves and use of sunscreen if there is significant sun exposure.   Diet: the importance of a healthy diet is discussed. They do have a healthy diet.  The benefits of regular aerobic exercise were discussed. She walks 4 times per week ,  20 minutes.  Does not walk due to leg pain  Has adequate pulses,  byut has u has BI with bilateral stasis changes, ,  COPD,  And right knee pain,  No replacement, multiple joints are painful,  But none have been replaced, no steroid injections or prior x rays of left shoulder   [place]pped [place] a  stone.  Then in June slipped on some wet wooden steps, wrenched left shoulder when he grabbed the railing to prevent falling .  Doesn't want to see Ortho   Depression screen: there are no signs or vegative symptoms of depression- irritability, change in appetite, anhedonia, sadness/tearfullness.  Cognitive assessment: the patient manages all their financial and personal affairs and is actively engaged. They could relate day,date,year and events; recalled 2/3 objects at 3 minutes; performed clock-face test normally.  The following portions of the patient's history were reviewed and updated as appropriate: allergies, current medications, past family history, past medical history,  past surgical history, past social history  and problem list.  Visual acuity was not assessed per patient preference since she has regular follow up with her ophthalmologist. Hearing and body mass index were assessed and reviewed.   During the course of the visit the patient was educated and counseled about appropriate screening and preventive services including : fall prevention , diabetes screening, nutrition counseling, colorectal cancer screening, and recommended immunizations.    CC: The primary encounter diagnosis was Prostate cancer screening. Diagnoses of Routine general medical examination at a health care facility and Recurrent pulmonary embolism [facility]) were also pertinent to this visit.  History Seth Perez has a past medical history of Arthritis; Chicken pox; COPD (chronic obstructive pulmonary  disease) ([place]); and Pulmonary emboli ([place]).   He has past surgical history that includes Cholecystectomy (2008) and Tonsillectomy and adenoidectomy (1947).   His family history includes Arthritis in his mother; Asthma in his father; Breast cancer in his maternal grandmother; Emphysema in his father; Heart disease in his maternal grandfather and paternal grandfather; Lung cancer in his brother.He reports that he quit smoking  about 2 years ago. His smoking use included Cigarettes. He has a 50 pack-year smoking history. He has never used smokeless tobacco. He reports that he drinks about 1.0 oz of alcohol per week. He reports that he does not use illicit drugs.  Outpatient Prescriptions Prior to Visit  Medication Sig Dispense Refill  . albuterol (VENTOLIN HFA) 108 (90 BASE) MCG/ACT inhaler Inhale 2 puffs into the lungs every 6 (six) hours as needed for wheezing.    . rivaroxaban (XARELTO) 20 MG TABS tablet Take 1 tablet (20 mg total) by mouth daily with supper. 30 tablet 5  . SPIRIVA HANDIHALER 18 MCG inhalation capsule INHALE 1 CAPSULE VIA HANDIHALER ONCE DAILY AT THE SAME TIME EVERY DAY 30 capsule 5  . tamsulosin (FLOMAX) 0.4 MG CAPS Take 0.4 mg by mouth daily.     No facility-administered medications prior to visit.    Review of Systems  Patient denies headache, fevers, malaise, unintentional weight loss, skin rash, eye pain, sinus congestion and sinus pain, sore throat, dysphagia,  hemoptysis , cough, dyspnea, wheezing, chest pain, palpitations, orthopnea, edema, abdominal pain, nausea, melena, diarrhea, constipation, flank pain, dysuria, hematuria, urinary  Frequency, nocturia, numbness, tingling, seizures,  Focal weakness, Loss of consciousness,  Tremor, insomnia, depression, anxiety, and suicidal ideation.      Objective:  BP 124/70 mmHg  Pulse 75  Temp(Src) 98.2 F (36.8 C) (Oral)  Resp 12  Ht 6\' 3"  (1.905 m)  Wt 239 lb 12 oz (108.75 kg)  BMI 29.97 kg/m2  SpO2 96%  Physical Exam  General appearance: alert, cooperative and appears stated age Ears: normal TM's and external ear canals both ears Throat: lips, mucosa, and tongue normal; teeth and gums normal Neck: no adenopathy, no carotid bruit, supple, symmetrical, trachea midline and thyroid not enlarged, symmetric, no tenderness/mass/nodules Back: symmetric, no curvature. ROM normal. No CVA tenderness. Lungs: clear to auscultation  bilaterally Heart: regular rate and rhythm, S1, S2 normal, no murmur, click, rub or gallop Abdomen: large protuberant, suggestive of hernia. soft, non-tender; bowel sounds normal; no masses,  no organomegaly Pulses: 2+ and symmetric Skin: Skin color, texture, turgor normal. No rashes or lesions Lymph nodes: Cervical, supraclavicular, and axillary nodes normal.  Assessment & Plan:   Problem List Items Addressed This Visit    Routine general medical examination at a health care facility    Annual comprehensive preventive exam was done as well as an evaluation and management of chronic conditions .  During the course of the visit the patient was educated and counseled about appropriate screening and preventive services including :  diabetes screening, lipid analysis with projected  10 year  risk for CAD    Printed recommendations for health maintenance screenings was given.       Recurrent pulmonary embolism (HCC)    Lifelong anticoagulation with Xarelto.       Prostate cancer screening - Primary    Done by Dr Jacqlyn Larsen with DRE and PSA          I have changed Seth Perez's tamsulosin. I am also having him maintain his albuterol, rivaroxaban, and SPIRIVA HANDIHALER.  Meds ordered this encounter  Medications  . tamsulosin (FLOMAX) 0.4 MG CAPS capsule    Sig: Take 1 capsule (0.4 mg total) by mouth daily.    Dispense:  30 capsule    Refill:  0    Medications Discontinued During This Encounter  Medication Reason  . tamsulosin (FLOMAX) 0.4 MG CAPS Reorder    Follow-up: Return in about 6 months (around 02/28/2016).   [name], MD

## 2015-08-30 NOTE — Progress Notes (Signed)
Pre-visit discussion using our clinic review tool. No additional management support is needed unless otherwise documented below in the visit note.  

## 2015-09-02 NOTE — Assessment & Plan Note (Signed)
Lifelong anticoagulation with Xarelto.

## 2015-09-02 NOTE — Assessment & Plan Note (Signed)
Annual comprehensive preventive exam was done as well as an evaluation and management of chronic conditions .  During the course of the visit the patient was educated and counseled about appropriate screening and preventive services including :  diabetes screening, lipid analysis with projected  10 year  risk for CAD    Printed recommendations for health maintenance screenings was given.

## 2015-09-20 ENCOUNTER — Encounter: Payer: Self-pay | Admitting: *Deleted

## 2016-02-16 ENCOUNTER — Ambulatory Visit (INDEPENDENT_AMBULATORY_CARE_PROVIDER_SITE_OTHER): Payer: Medicare Other | Admitting: Pulmonary Disease

## 2016-02-16 ENCOUNTER — Encounter: Payer: Self-pay | Admitting: Pulmonary Disease

## 2016-02-16 VITALS — BP 126/72 | HR 65 | Ht 75.0 in | Wt 252.0 lb

## 2016-02-16 DIAGNOSIS — J432 Centrilobular emphysema: Secondary | ICD-10-CM

## 2016-02-16 DIAGNOSIS — Z720 Tobacco use: Secondary | ICD-10-CM

## 2016-02-16 DIAGNOSIS — I2699 Other pulmonary embolism without acute cor pulmonale: Secondary | ICD-10-CM

## 2016-02-16 DIAGNOSIS — F17201 Nicotine dependence, unspecified, in remission: Secondary | ICD-10-CM | POA: Insufficient documentation

## 2016-02-16 NOTE — Assessment & Plan Note (Signed)
He continues to do well without complicatoin from his Xarelto.  Plan: Continue lifelong anticoagulation

## 2016-02-16 NOTE — Patient Instructions (Signed)
We will refer you to the lung cancer screening program Keep taking Xarelto and Spiriva as you're doing I will see you back in one year or sooner if needed

## 2016-02-16 NOTE — Assessment & Plan Note (Signed)
We will refer him to the low dose lung cancer screening program.

## 2016-02-16 NOTE — Progress Notes (Signed)
  Subjective:    Patient ID: Seth Perez., male    DOB: 05-18-1941, 75 y.o.   MRN: [id number]  Synopsis: Mr. Vanecek first saw the [facility] pulmonary clinic in March of 2014 for evaluation of pulmonary embolism, a pulmonary nodule and COPD. He had moderate obstruction on pulmonary function testing and was prescribed Spiriva. He had a provoked pulmonary embolism in late February 2014 and was treated for this with Xarelto. He also had a 1.2 cm pleural-based nodule in the right upper lobe.  In may of 2015 he had a repeat large pulmonary embolism requiring thrombolytic therapy and an IVC filter placement. He quit smoking in 2014, 1 ppd for about 57 years.   HPI Chief Complaint  Patient presents with  . Follow-up    pt doing well, no breathing complaints at this time.  CAT score 8    jadarian hutson been doing OK.  He continues to take the Xarelto but will occasionally forget to take a dose. He has not had an worsening leg swelling.   He has not had any trouble breathing.  He says that he really doesn't feel limited regarding his COPD.  He is not limiting himself due to dyspnea. He continues to take Spiriva daily.     Past Medical History  Diagnosis Date  . Arthritis   . Chicken pox   . COPD (chronic obstructive pulmonary disease) ([place])   . Pulmonary emboli (HCC)     on Xarelto     Review of Systems  Constitutional: Negative for fever, chills and fatigue.  HENT: Negative for postnasal drip, sinus pressure and sneezing.   Respiratory: Negative for cough, shortness of breath and wheezing.   Cardiovascular: Negative for chest pain, palpitations and leg swelling.       Objective:   Physical Exam  Filed Vitals:   02/16/16 1220  BP: 126/72  Pulse: 65  Height: 6\' 3"  (1.905 m)  Weight: 252 lb (114.306 kg)  SpO2: 96%  RA  Gen: well appearing, no acute distress HEENT: NCAT, EOMi, OP clear, PULM: CTA B CV: RRR, no mgr, no JVD AB: BS+, soft, nontender,  MSK: warm,  chronic swelling decreased      Assessment & Plan:   Tobacco use disorder, mild, in sustained remission, abuse We will refer him to the low dose lung cancer screening program.  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  Plan: Continue lifelong anticoagulation  COPD (chronic obstructive pulmonary disease) with emphysema ([place]) This has been a stable interval for him. He has moderate COPD and has not had an exacerbation in the last year. He is tolerant of Spiriva without significant side effect.  Plan: Continue  Spiriva Flu shot in the fall Follow-up one year    Updated Medication List Outpatient Encounter Prescriptions as of 02/16/2016  Medication Sig  . albuterol (VENTOLIN HFA) 108 (90 BASE) MCG/ACT inhaler Inhale 2 puffs into the lungs every 6 (six) hours as needed for wheezing.  . rivaroxaban (XARELTO) 20 MG TABS tablet Take 1 tablet (20 mg total) by mouth daily with supper.  Marland Kitchen SPIRIVA HANDIHALER 18 MCG inhalation capsule INHALE 1 CAPSULE VIA HANDIHALER ONCE DAILY AT THE SAME TIME EVERY DAY  . tamsulosin (FLOMAX) 0.4 MG CAPS capsule Take 1 capsule (0.4 mg total) by mouth daily. (Patient not taking: Reported on 02/16/2016)   No facility-administered encounter medications on file as of 02/16/2016.

## 2016-02-16 NOTE — Assessment & Plan Note (Signed)
This has been a stable interval for him. He has moderate COPD and has not had an exacerbation in the last year. He is tolerant of Spiriva without significant side effect.  Plan: Continue  Spiriva Flu shot in the fall Follow-up one year

## 2016-02-28 ENCOUNTER — Ambulatory Visit (INDEPENDENT_AMBULATORY_CARE_PROVIDER_SITE_OTHER): Payer: Medicare Other | Admitting: Internal Medicine

## 2016-02-28 ENCOUNTER — Encounter: Payer: Self-pay | Admitting: Internal Medicine

## 2016-02-28 VITALS — BP 122/64 | HR 61 | Temp 97.7°F | Resp 12 | Ht 75.0 in | Wt 250.2 lb

## 2016-02-28 DIAGNOSIS — E785 Hyperlipidemia, unspecified: Secondary | ICD-10-CM

## 2016-02-28 DIAGNOSIS — I878 Other specified disorders of veins: Secondary | ICD-10-CM | POA: Diagnosis not present

## 2016-02-28 DIAGNOSIS — I951 Orthostatic hypotension: Secondary | ICD-10-CM

## 2016-02-28 LAB — CBC WITH DIFFERENTIAL/PLATELET
BASOS PCT: 0.5 % (ref 0.0–3.0)
Basophils Absolute: 0 10*3/uL (ref 0.0–0.1)
EOS PCT: 2.2 % (ref 0.0–5.0)
Eosinophils Absolute: 0.2 10*3/uL (ref 0.0–0.7)
HCT: 46.6 % (ref 39.0–52.0)
HEMOGLOBIN: 15.5 g/dL (ref 13.0–17.0)
LYMPHS ABS: 2.9 10*3/uL (ref 0.7–4.0)
Lymphocytes Relative: 33.1 % (ref 12.0–46.0)
MCHC: 33.3 g/dL (ref 30.0–36.0)
MCV: 90.2 fl (ref 78.0–100.0)
MONO ABS: 0.8 10*3/uL (ref 0.1–1.0)
MONOS PCT: 8.8 % (ref 3.0–12.0)
NEUTROS PCT: 55.4 % (ref 43.0–77.0)
Neutro Abs: 4.9 10*3/uL (ref 1.4–7.7)
Platelets: 200 10*3/uL (ref 150.0–400.0)
RBC: 5.17 Mil/uL (ref 4.22–5.81)
RDW: 13.6 % (ref 11.5–15.5)
WBC: 8.8 10*3/uL (ref 4.0–10.5)

## 2016-02-28 LAB — COMPREHENSIVE METABOLIC PANEL
ALT: 18 U/L (ref 0–53)
AST: 18 U/L (ref 0–37)
Albumin: 4.3 g/dL (ref 3.5–5.2)
Alkaline Phosphatase: 69 U/L (ref 39–117)
BUN: 13 mg/dL (ref 6–23)
CHLORIDE: 103 meq/L (ref 96–112)
CO2: 31 mEq/L (ref 19–32)
Calcium: 9.6 mg/dL (ref 8.4–10.5)
Creatinine, Ser: 0.87 mg/dL (ref 0.40–1.50)
GFR: 90.7 mL/min (ref >60.00)
GLUCOSE: 102 mg/dL — AB (ref 70–99)
POTASSIUM: 4.7 meq/L (ref 3.5–5.1)
SODIUM: 139 meq/L (ref 135–145)
Total Bilirubin: 0.6 mg/dL (ref 0.2–1.2)
Total Protein: 7.4 g/dL (ref 6.0–8.3)

## 2016-02-28 LAB — LIPID PANEL
CHOLESTEROL: 215 mg/dL — AB (ref 0–200)
HDL: 36.6 mg/dL — ABNORMAL LOW (ref >39.00)
LDL CALC: 148 mg/dL — AB (ref 0–99)
NONHDL: 178.69
Total CHOL/HDL Ratio: 6
Triglycerides: 151 mg/dL — ABNORMAL HIGH (ref 0.0–149.0)
VLDL: 30.2 mg/dL (ref 0.0–40.0)

## 2016-02-28 MED ORDER — TIOTROPIUM BROMIDE MONOHYDRATE 18 MCG IN CAPS: ORAL_CAPSULE | RESPIRATORY_TRACT | Status: DC

## 2016-02-28 MED ORDER — ALBUTEROL SULFATE HFA 108 (90 BASE) MCG/ACT IN AERS: 2.0000 | INHALATION_SPRAY | Freq: Four times a day (QID) | RESPIRATORY_TRACT | Status: DC | PRN

## 2016-02-28 NOTE — Progress Notes (Signed)
Pre-visit discussion using our clinic review tool. No additional management support is needed unless otherwise documented below in the visit note.  

## 2016-02-28 NOTE — Progress Notes (Signed)
Subjective:  Patient ID: Seth Perez., male    DOB: 11-14-1940  Age: 75 y.o. MRN: [id number]  CC: The primary encounter diagnosis was Orthostasis. Diagnoses of Hyperlipidemia and Lower extremity venous stasis were also pertinent to this visit.  HPI Seth COTTEN Sr. presents for evaluation of light headedness  Last seen in December 2016.  The symptoms occur with sudden position change.  They have improved since he stopped taking flomax..  He has had no falls.  He is tolerating xarelto without bleeding and bruising.   He has been using Spiriva daily  .  NO recent COPD exacerbation .  Low dose CT Lung CA screening ordered.   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 Has an IVC filter ,  Last checked  Unknown.    Outpatient Prescriptions Prior to Visit  Medication Sig Dispense Refill  . rivaroxaban (XARELTO) 20 MG TABS tablet Take 1 tablet (20 mg total) by mouth daily with supper. 30 tablet 5  . SPIRIVA HANDIHALER 18 MCG inhalation capsule INHALE 1 CAPSULE VIA HANDIHALER ONCE DAILY AT THE SAME TIME EVERY DAY 30 capsule 5  . tamsulosin (FLOMAX) 0.4 MG CAPS capsule Take 1 capsule (0.4 mg total) by mouth daily. (Patient not taking: Reported on 02/16/2016) 30 capsule 0  . albuterol (VENTOLIN HFA) 108 (90 BASE) MCG/ACT inhaler Inhale 2 puffs into the lungs every 6 (six) hours as needed for wheezing. Reported on 02/28/2016     No facility-administered medications prior to visit.    Review of Systems;  Patient denies headache, fevers, malaise, unintentional weight loss, skin rash, eye pain, sinus congestion and sinus pain, sore throat, dysphagia,  hemoptysis , cough, dyspnea, wheezing, chest pain, palpitations, orthopnea,, abdoinal pain, nausea, melena, diarrhea, constipation, flank pain, dysuria, hematuria, urinary  Frequency, nocturia, numbness, tingling, seizures,  Focal weakness, Loss of consciousness,  Tremor, insomnia, depression, anxiety, and  suicidal ideation.      Objective:  BP 122/64 mmHg  Pulse 61  Temp(Src) 97.7 F (36.5 C) (Oral)  Resp 12  Ht 6\' 3"  (1.905 m)  Wt 250 lb 4 oz (113.513 kg)  BMI 31.28 kg/m2  SpO2 97%  BP Readings from Last 3 Encounters:  02/28/16 122/64  02/16/16 126/72  08/30/15 124/70    Wt Readings from Last 3 Encounters:  02/28/16 250 lb 4 oz (113.513 kg)  02/16/16 252 lb (114.306 kg)  08/30/15 239 lb 12 oz (108.75 kg)    General appearance: alert, cooperative and appears stated age Ears: normal TM's and external ear canals both ears Throat: lips, mucosa, and tongue normal; teeth and gums normal Neck: no adenopathy, no carotid bruit, supple, symmetrical, trachea midline and thyroid not enlarged, symmetric, no tenderness/mass/nodules Back: symmetric, no curvature. ROM normal. No CVA tenderness. Lungs: clear to auscultation bilaterally Heart: regular rate and rhythm, S1, S2 normal, no murmur, click, rub or gallop Abdomen: soft, non-tender; bowel sounds normal; no masses,  no organomegaly Pulses: 2+ and symmetric Skin: Skin color, texture, turgor normal. No rashes or lesions Lymph nodes: Cervical, supraclavicular, and axillary nodes normal.  Lab Results  Component Value Date   HGBA1C 5.7 10/24/2012   HGBA1C 5.8 10/23/2012    Lab Results  Component Value Date   CREATININE 0.87 02/28/2016   CREATININE 0.97 08/24/2015   CREATININE 1.1 06/17/2014    Lab Results  Component Value Date   WBC 8.8 02/28/2016   HGB 15.5 02/28/2016   HCT 46.6 02/28/2016  PLT 200.0 02/28/2016   GLUCOSE 102* 02/28/2016   CHOL 215* 02/28/2016   TRIG 151.0* 02/28/2016   HDL 36.60* 02/28/2016   LDLCALC 148* 02/28/2016   ALT 18 02/28/2016   AST 18 02/28/2016   NA 139 02/28/2016   K 4.7 02/28/2016   CL 103 02/28/2016   CREATININE 0.87 02/28/2016   BUN 13 02/28/2016   CO2 31 02/28/2016   TSH 1.82 06/17/2014   PSA 11.44* 11/24/2012   INR 1.5 01/26/2014   HGBA1C 5.7 10/24/2012    Assessment &  Plan:   Problem List Items Addressed This Visit    Lower extremity venous stasis    Secondary to DVT.  Continue use of compression stockings.       Orthostasis - Primary    Improved with cessation of Flomax.  He is advised to liberalize the salt in his diet and continue use of compression stockings.       Relevant Orders   Comprehensive metabolic panel (Completed)   CBC with Differential/Platelet (Completed)    Other Visit Diagnoses    Hyperlipidemia        Relevant Orders    Lipid panel (Completed)       I have changed Seth Perez SPIRIVA HANDIHALER to tiotropium. I am also having him maintain his rivaroxaban, tamsulosin, and albuterol.  Meds ordered this encounter  Medications  . albuterol (VENTOLIN HFA) 108 (90 Base) MCG/ACT inhaler    Sig: Inhale 2 puffs into the lungs every 6 (six) hours as needed for wheezing. Reported on 02/28/2016    Dispense:  3.7 g    Refill:  5  . tiotropium (SPIRIVA HANDIHALER) 18 MCG inhalation capsule    Sig: INHALE 1 CAPSULE VIA HANDIHALER ONCE DAILY AT THE SAME TIME EVERY DAY    Dispense:  30 capsule    Refill:  5    Medications Discontinued During This Encounter  Medication Reason  . albuterol (VENTOLIN HFA) 108 (90 BASE) MCG/ACT inhaler Reorder  . SPIRIVA HANDIHALER 18 MCG inhalation capsule Reorder    Follow-up: Return in about 6 months (around 08/29/2016) for wellness exam.   Seth Mc, MD

## 2016-02-28 NOTE — Patient Instructions (Signed)

## 2016-02-29 DIAGNOSIS — I951 Orthostatic hypotension: Secondary | ICD-10-CM | POA: Insufficient documentation

## 2016-02-29 NOTE — Assessment & Plan Note (Signed)
Improved with cessation of Flomax.  He is advised to liberalize the salt in his diet and continue use of compression stockings.

## 2016-02-29 NOTE — Assessment & Plan Note (Signed)
Secondary to DVT.  Continue use of compression stockings.

## 2016-03-01 ENCOUNTER — Encounter: Payer: Self-pay | Admitting: *Deleted

## 2016-03-12 ENCOUNTER — Telehealth: Payer: Self-pay | Admitting: *Deleted

## 2016-03-12 NOTE — Telephone Encounter (Signed)
Left voicemail for patient notifyng them that it is time to schedule annual low dose lung cancer screening CT scan. Instructed patient to call back to verify information prior to the scan being scheduled.  

## 2016-04-15 ENCOUNTER — Telehealth: Payer: Self-pay | Admitting: *Deleted

## 2016-04-15 NOTE — Telephone Encounter (Signed)
Received referral for low dose lung cancer screening CT scan. Voicemail left at phone number listed in EMR for patient to call me back to facilitate scheduling scan.  

## 2016-04-16 ENCOUNTER — Telehealth: Payer: Self-pay | Admitting: *Deleted

## 2016-04-16 NOTE — Telephone Encounter (Signed)
Received referral for low dose CT lung cancer screening. Despite multiple attempts at all contact numbers available, have not been able to arrange for shared decision making visit and CT scan. Letter mailed to patient in final attempt to contact patient. I will be happy to assist in the future if patient so desires. Will forward to referring provider.  

## 2016-05-10 ENCOUNTER — Telehealth: Payer: Self-pay | Admitting: *Deleted

## 2016-05-10 NOTE — Telephone Encounter (Signed)
Received message that patient is interested in having lung cancer screening scan. Voicemail left at phone number listed in EMR for patient to call me back to facilitate scheduling scan.

## 2016-05-10 NOTE — Telephone Encounter (Signed)
Received referral for initial lung cancer screening scan. Contacted patient and obtained smoking history,(former, quit 10/2012, 1ppd x 59 years) as well as answering questions related to screening process. Patient denies signs of lung cancer such as weight loss or hemoptysis. Patient denies comorbidity that would prevent curative treatment if lung cancer were found. Patient is tentatively scheduled for shared decision making visit and CT scan on 05/21/16 at 1:30pm, pending insurance approval from business office.

## 2016-05-21 ENCOUNTER — Inpatient Hospital Stay: Payer: Medicare Other | Attending: Oncology | Admitting: Oncology

## 2016-05-21 ENCOUNTER — Ambulatory Visit
Admission: RE | Admit: 2016-05-21 | Discharge: 2016-05-21 | Disposition: A | Discharge status: Home / Self Care | Payer: Medicare Other | Source: Ambulatory Visit | Attending: Oncology | Admitting: Oncology

## 2016-05-21 ENCOUNTER — Encounter: Payer: Self-pay | Admitting: Oncology

## 2016-05-21 ENCOUNTER — Other Ambulatory Visit: Payer: Self-pay | Admitting: *Deleted

## 2016-05-21 DIAGNOSIS — Z122 Encounter for screening for malignant neoplasm of respiratory organs: Secondary | ICD-10-CM | POA: Diagnosis not present

## 2016-05-21 DIAGNOSIS — Z87891 Personal history of nicotine dependence: Secondary | ICD-10-CM | POA: Diagnosis not present

## 2016-05-21 DIAGNOSIS — J439 Emphysema, unspecified: Secondary | ICD-10-CM | POA: Diagnosis not present

## 2016-05-21 DIAGNOSIS — I2510 Atherosclerotic heart disease of native coronary artery without angina pectoris: Secondary | ICD-10-CM | POA: Diagnosis not present

## 2016-05-21 NOTE — Progress Notes (Signed)
In accordance with CMS guidelines, patient has met eligibility criteria including age, absence of signs or symptoms of lung cancer.  Social History  Substance Use Topics  . Smoking status: Former Smoker    Packs/day: 1.00    Years: 59.00    Types: Cigarettes    Quit date: 10/12/2012  . Smokeless tobacco: Never Used  . Alcohol use 1.0 oz/week    2 Standard drinks or equivalent per week     A shared decision-making session was conducted prior to the performance of CT scan. This includes one or more decision aids, includes benefits and harms of screening, follow-up diagnostic testing, over-diagnosis, false positive rate, and total radiation exposure.  Counseling on the importance of adherence to annual lung cancer LDCT screening, impact of co-morbidities, and ability or willingness to undergo diagnosis and treatment is imperative for compliance of the program.  Counseling on the importance of continued smoking cessation for former smokers; the importance of smoking cessation for current smokers, and information about tobacco cessation interventions have been given to patient including [facility]uit [facility] and 1800 quit [place] programs.  Written order for lung cancer screening with LDCT has been given to the patient and any and all questions have been answered to the best of my abilities.   Yearly follow up will be coordinated by Shawn Perkins, Thoracic Navigator.  

## 2016-05-23 ENCOUNTER — Telehealth: Payer: Self-pay | Admitting: *Deleted

## 2016-05-23 NOTE — Telephone Encounter (Signed)
Notified patient of LDCT lung cancer screening results with recommendation for 12 month follow up imaging. Also notified of incidental finding noted below. Patient verbalizes understanding.   IMPRESSION: 1. Lung-RADS Category 2, benign appearance or behavior. Continue annual screening with low-dose chest CT without contrast in 12 months. 2. Emphysema. 3. Coronary artery atherosclerosis

## 2016-06-19 ENCOUNTER — Other Ambulatory Visit: Payer: Self-pay | Admitting: Pulmonary Disease

## 2016-08-02 ENCOUNTER — Ambulatory Visit (INDEPENDENT_AMBULATORY_CARE_PROVIDER_SITE_OTHER): Payer: Medicare Other

## 2016-08-02 VITALS — BP 122/80 | HR 62 | Temp 97.7°F | Resp 14 | Ht 75.0 in | Wt 248.1 lb

## 2016-08-02 DIAGNOSIS — Z23 Encounter for immunization: Secondary | ICD-10-CM | POA: Diagnosis not present

## 2016-08-02 DIAGNOSIS — Z0000 Encounter for general adult medical examination without abnormal findings: Secondary | ICD-10-CM

## 2016-08-02 NOTE — Progress Notes (Signed)
Subjective:   Seth CORZO Sr. is a 75 y.o. male who presents for Medicare Annual/Subsequent preventive examination.  Review of Systems:  No ROS.  Medicare Wellness Visit.  Cardiac Risk Factors include: advanced age (>71men, >58 women);male gender     Objective:    Vitals: BP 122/80 (BP Location: Right Arm, Patient Position: Sitting, Cuff Size: Normal)   Pulse 62   Temp 97.7 F (36.5 C) (Oral)   Resp 14   Ht 6\' 3"  (1.905 m)   Wt 248 lb 1.9 oz (112.5 kg)   SpO2 95%   BMI 31.01 kg/m   Body mass index is 31.01 kg/m.  Tobacco History  Smoking Status  . Former Smoker  . Packs/day: 1.00  . Years: 59.00  . Types: Cigarettes  . Quit date: 10/12/2012  Smokeless Tobacco  . Never Used     Counseling given: Not Answered   Past Medical History:  Diagnosis Date  . Arthritis   . Chicken pox   . COPD (chronic obstructive pulmonary disease) ([place])   . Pulmonary emboli ([place])    on Xarelto   Past Surgical History:  Procedure Laterality Date  . CHOLECYSTECTOMY  2008  . TONSILLECTOMY AND ADENOIDECTOMY  1947   Family History  Problem Relation Age of Onset  . Arthritis Mother   . Lung cancer Brother     was a smoker  . Breast cancer Maternal Grandmother   . Heart disease Maternal Grandfather   . Asthma Father   . Emphysema Father   . Heart disease Paternal Grandfather    History  Sexual Activity  . Sexual activity: Not Currently    Outpatient Encounter Prescriptions as of 08/02/2016  Medication Sig  . albuterol (VENTOLIN HFA) 108 (90 Base) MCG/ACT inhaler Inhale 2 puffs into the lungs every 6 (six) hours as needed for wheezing. Reported on 02/28/2016  . tiotropium (SPIRIVA HANDIHALER) 18 MCG inhalation capsule INHALE 1 CAPSULE VIA HANDIHALER ONCE DAILY AT THE SAME TIME EVERY DAY  . XARELTO 20 MG TABS tablet TAKE 1 TABLET BY MOUTH EVERY DAY WITH SUPPER  . tamsulosin (FLOMAX) 0.4 MG CAPS capsule Take 1 capsule (0.4 mg total) by mouth daily. (Patient not taking:  Reported on 08/02/2016)   No facility-administered encounter medications on file as of 08/02/2016.     Activities of Daily Living In your present state of health, do you have any difficulty performing the following activities: 08/02/2016 08/03/2015  Hearing? Tempie Donning  Vision? N N  Difficulty concentrating or making decisions? N N  Walking or climbing stairs? N Y  Dressing or bathing? N N  Doing errands, shopping? N N  Preparing Food and eating ? N N  Using the Toilet? N N  In the past six months, have you accidently leaked urine? Y N  Do you have problems with loss of bowel control? N N  Managing your Medications? N N  Managing your Finances? N N  Housekeeping or managing your Housekeeping? N N  Some recent data might be hidden    Patient Care Team: [name], MD as PCP - General (Internal Medicine)   Assessment:    This is a routine wellness examination for Seth Perez. The goal of the wellness visit is to assist the patient how to close the gaps in care and create a preventative care plan for the patient.   Osteoporosis reviewed.  Medications reviewed; taking without issues or barriers.  Safety issues reviewed; smoke detectors in the home. No firearms in  the home. Wears seatbelts when driving or riding with others. No violence in the home.  No identified risk were noted; The patient was oriented x 3; appropriate in dress and manner and no objective failures at ADL's or IADL's.   Body mass index; discussed the importance of a healthy diet, water intake and exercise. Educational material provided.  High dose influenza administered R deltoid, tolerated well.  Health maintenance gaps; closed.  Patient Concerns: None at this time. Follow up with PCP as needed.  Exercise Activities and Dietary recommendations Current Exercise Habits: Home exercise routine, Type of exercise: walking, Time (Minutes): 20, Frequency (Times/Week): 1, Weekly Exercise (Minutes/Week):  20  Goals    . Increase physical activity          Currently walks around the block once a week.  Patient centered goal is to increase walking around the block up to 3 times a week.    . Increase water intake          Currently does not drink much more than a sip of water.  Patient centered is to increase water intake up to 2 bottle daily =4cups daily.      Fall Risk Fall Risk  08/02/2016 08/30/2015 08/03/2015 11/02/2012  Falls in the past year? Yes Yes Yes No  Number falls in past yr: 1 2 or more 2 or more -  Injury with Fall? Yes No No -  Risk Factor Category  - High Fall Risk - -  Risk for fall due to : - Impaired vision;Other (Comment) - -  Risk for fall due to (comments): - fell over tombstone - -  Follow up Education provided;Falls prevention discussed Falls prevention discussed Education provided;Falls prevention discussed;Falls evaluation completed -   Depression Screen PHQ 2/9 Scores 08/02/2016 08/30/2015 08/03/2015 11/02/2012  PHQ - 2 Score 0 0 0 0    Cognitive Function MMSE - Mini Mental State Exam 08/02/2016 08/03/2015  Orientation to time 5 5  Orientation to Place 5 5  Registration 3 3  Attention/ Calculation 5 5  Recall 3 3  Language- name 2 objects 2 2  Language- repeat 1 1  Language- follow 3 step command 3 3  Language- read & follow direction 1 1  Write a sentence 1 1  Copy design 1 1  Total score 30 30     6CIT Screen 08/02/2016  What Year? 0 points  What month? 0 points  What time? 0 points  Count back from 20 0 points  Months in reverse 0 points    Immunization History  Administered Date(s) Administered  . Influenza Split 06/17/2014  . Influenza, High Dose Seasonal PF 08/02/2016  . Influenza,inj,Quad PF,36+ Mos 08/03/2015  . Pneumococcal Conjugate-13 06/17/2014  . Pneumococcal Polysaccharide-23 06/18/2007  . Pneumococcal-Unspecified 09/28/2006  . Tdap 06/23/2014   Screening Tests Health Maintenance  Topic Date Due  . ZOSTAVAX   08/02/2016 (Originally 03/26/2000)  . TETANUS/TDAP  06/23/2024  . INFLUENZA VACCINE  Completed  . PNA vac Low Risk Adult  Completed      Plan:    End of life planning; Advance aging; Advanced directives discussed. Copy of current HCPOA/Living Will requested.  Medicare Attestation I have personally reviewed: The patient's medical and social history Their use of alcohol, tobacco or illicit drugs Their current medications and supplements The patient's functional ability including ADLs,fall risks, home safety risks, cognitive, and hearing and visual impairment Diet and physical activities Evidence for depression   The patient's weight, height, BMI, and  visual acuity have been recorded in the chart.  I have made referrals and provided education to the patient based on review of the above and I have provided the patient with a written personalized care plan for preventive services.    During the course of the visit the patient was educated and counseled about the following appropriate screening and preventive services:   Vaccines to include Pneumoccal, Influenza, Hepatitis B, Td, Zostavax, HCV  Electrocardiogram  Cardiovascular Disease  Colorectal cancer screening  Diabetes screening  Prostate Cancer Screening  Glaucoma screening  Nutrition counseling   Smoking cessation counseling  Patient Instructions (the written plan) was given to the patient.    Varney Biles, LPN  [id number]

## 2016-08-02 NOTE — Patient Instructions (Addendum)
Seth Perez , Thank you for taking time to come for your Medicare Wellness Visit. I appreciate your ongoing commitment to your health goals. Please review the following plan we discussed and let me know if I can assist you in the future.   FOLLOW UP WITH DR. Derrel Nip IN 6 MONTHS OR SOONER IF NEEDED.  These are the goals we discussed: Goals    . Increase physical activity          Currently walks around the block once a week.  Patient centered goal is to increase walking around the block up to 3 times a week.    . Increase water intake          Currently does not drink much more than a sip of water.  Patient centered is to increase water intake up to 2 bottle daily =4cups daily.       This is a list of the screening recommended for you and due dates:  Health Maintenance  Topic Date Due  . Flu Shot  04/23/2016  . Shingles Vaccine  08/02/2016*  . Tetanus Vaccine  06/23/2024  . Pneumonia vaccines  Completed  *Topic was postponed. The date shown is not the original due date.      Fall Prevention in the Home  Falls can cause injuries. They can happen to people of all ages. There are many things you can do to make your home safe and to help prevent falls.  WHAT CAN I DO ON THE OUTSIDE OF MY HOME?  Regularly fix the edges of walkways and driveways and fix any cracks.  Remove anything that might make you trip as you walk through a door, such as a raised step or threshold.  Trim any bushes or trees on the path to your home.  Use bright outdoor lighting.  Clear any walking paths of anything that might make someone trip, such as rocks or tools.  Regularly check to see if handrails are loose or broken. Make sure that both sides of any steps have handrails.  Any raised decks and porches should have guardrails on the edges.  Have any leaves, snow, or ice cleared regularly.  Use sand or salt on walking paths during winter.  Clean up any spills in your garage right away. This  includes oil or grease spills. WHAT CAN I DO IN THE BATHROOM?   Use night lights.  Install grab bars by the toilet and in the tub and shower. Do not use towel bars as grab bars.  Use non-skid mats or decals in the tub or shower.  If you need to sit down in the shower, use a plastic, non-slip stool.  Keep the floor dry. Clean up any water that spills on the floor as soon as it happens.  Remove soap buildup in the tub or shower regularly.  Attach bath mats securely with double-sided non-slip rug tape.  Do not have throw rugs and other things on the floor that can make you trip. WHAT CAN I DO IN THE BEDROOM?  Use night lights.  Make sure that you have a light by your bed that is easy to reach.  Do not use any sheets or blankets that are too big for your bed. They should not hang down onto the floor.  Have a firm chair that has side arms. You can use this for support while you get dressed.  Do not have throw rugs and other things on the floor that can make you trip.  WHAT CAN I DO IN THE KITCHEN?  Clean up any spills right away.  Avoid walking on wet floors.  Keep items that you use a lot in easy-to-reach places.  If you need to reach something above you, use a strong step stool that has a grab bar.  Keep electrical cords out of the way.  Do not use floor polish or wax that makes floors slippery. If you must use wax, use non-skid floor wax.  Do not have throw rugs and other things on the floor that can make you trip. WHAT CAN I DO WITH MY STAIRS?  Do not leave any items on the stairs.  Make sure that there are handrails on both sides of the stairs and use them. Fix handrails that are broken or loose. Make sure that handrails are as long as the stairways.  Check any carpeting to make sure that it is firmly attached to the stairs. Fix any carpet that is loose or worn.  Avoid having throw rugs at the top or bottom of the stairs. If you do have throw rugs, attach them to  the floor with carpet tape.  Make sure that you have a light switch at the top of the stairs and the bottom of the stairs. If you do not have them, ask someone to add them for you. WHAT ELSE CAN I DO TO HELP PREVENT FALLS?  Wear shoes that:  Do not have high heels.  Have rubber bottoms.  Are comfortable and fit you well.  Are closed at the toe. Do not wear sandals.  If you use a stepladder:  Make sure that it is fully opened. Do not climb a closed stepladder.  Make sure that both sides of the stepladder are locked into place.  Ask someone to hold it for you, if possible.  Clearly mark and make sure that you can see:  Any grab bars or handrails.  First and last steps.  Where the edge of each step is.  Use tools that help you move around (mobility aids) if they are needed. These include:  Canes.  Walkers.  Scooters.  Crutches.  Turn on the lights when you go into a dark area. Replace any light bulbs as soon as they burn out.  Set up your furniture so you have a clear path. Avoid moving your furniture around.  If any of your floors are uneven, fix them.  If there are any pets around you, be aware of where they are.  Review your medicines with your doctor. Some medicines can make you feel dizzy. This can increase your chance of falling. Ask your doctor what other things that you can do to help prevent falls.   This information is not intended to replace advice given to you by your health care provider. Make sure you discuss any questions you have with your health care provider.   Document Released: 07/06/2009 Document Revised: 01/24/2015 Document Reviewed: 10/14/2014 Elsevier Interactive Patient Education [employer].

## 2016-08-04 NOTE — Progress Notes (Signed)
  I have reviewed the above information and agree with above.   Teresa Tullo, MD 

## 2016-08-05 ENCOUNTER — Ambulatory Visit: Payer: Medicare Other | Admitting: Internal Medicine

## 2016-08-20 ENCOUNTER — Other Ambulatory Visit (INDEPENDENT_AMBULATORY_CARE_PROVIDER_SITE_OTHER): Payer: Self-pay | Admitting: Vascular Surgery

## 2016-08-20 DIAGNOSIS — Z95828 Presence of other vascular implants and grafts: Secondary | ICD-10-CM

## 2016-08-20 DIAGNOSIS — I2699 Other pulmonary embolism without acute cor pulmonale: Secondary | ICD-10-CM

## 2016-08-27 ENCOUNTER — Ambulatory Visit (INDEPENDENT_AMBULATORY_CARE_PROVIDER_SITE_OTHER): Payer: Medicare Other | Admitting: Vascular Surgery

## 2016-08-27 ENCOUNTER — Ambulatory Visit (INDEPENDENT_AMBULATORY_CARE_PROVIDER_SITE_OTHER): Payer: Medicare Other

## 2016-08-27 ENCOUNTER — Telehealth: Payer: Self-pay | Admitting: Pulmonary Disease

## 2016-08-27 ENCOUNTER — Encounter (INDEPENDENT_AMBULATORY_CARE_PROVIDER_SITE_OTHER): Payer: Self-pay | Admitting: Vascular Surgery

## 2016-08-27 VITALS — BP 117/77 | HR 62 | Resp 16 | Ht 75.0 in | Wt 248.0 lb

## 2016-08-27 DIAGNOSIS — R911 Solitary pulmonary nodule: Secondary | ICD-10-CM

## 2016-08-27 DIAGNOSIS — I87009 Postthrombotic syndrome without complications of unspecified extremity: Secondary | ICD-10-CM

## 2016-08-27 DIAGNOSIS — I2699 Other pulmonary embolism without acute cor pulmonale: Secondary | ICD-10-CM

## 2016-08-27 DIAGNOSIS — I82429 Acute embolism and thrombosis of unspecified iliac vein: Secondary | ICD-10-CM | POA: Diagnosis not present

## 2016-08-27 DIAGNOSIS — Z95828 Presence of other vascular implants and grafts: Secondary | ICD-10-CM

## 2016-08-27 DIAGNOSIS — M7989 Other specified soft tissue disorders: Secondary | ICD-10-CM | POA: Diagnosis not present

## 2016-08-27 DIAGNOSIS — J432 Centrilobular emphysema: Secondary | ICD-10-CM | POA: Diagnosis not present

## 2016-08-27 NOTE — Progress Notes (Signed)
MRN : [id number]  Seth BAUSMAN Sr. is a 75 y.o. (02-26-1941) male who presents with chief complaint of  Chief Complaint  Patient presents with  . Follow-up  .  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 He had an IVC filter placed about 2-1/2 years ago and remains on anticoagulation. He has chronic swelling which is stable. He has no new ulceration or infection. Patient desires to keep his filter and remain on anticoagulation indefinitely.  His duplex shows a patent IVC and iliac veins with a filter in place.     Current Outpatient Prescriptions  Medication Sig Dispense Refill  . albuterol (VENTOLIN HFA) 108 (90 Base) MCG/ACT inhaler Inhale 2 puffs into the lungs every 6 (six) hours as needed for wheezing. Reported on 02/28/2016 3.7 g 5  . tamsulosin (FLOMAX) 0.4 MG CAPS capsule Take 1 capsule (0.4 mg total) by mouth daily. 30 capsule 0  . tiotropium (SPIRIVA HANDIHALER) 18 MCG inhalation capsule INHALE 1 CAPSULE VIA HANDIHALER ONCE DAILY AT THE SAME TIME EVERY DAY 30 capsule 5  . XARELTO 20 MG TABS tablet TAKE 1 TABLET BY MOUTH EVERY DAY WITH SUPPER 30 tablet 5   No current facility-administered medications for this visit.     Past Medical History:  Diagnosis Date  . Arthritis   . Chicken pox   . COPD (chronic obstructive pulmonary disease) ([place])   . Pulmonary emboli ([place])    on Xarelto    Past Surgical History:  Procedure Laterality Date  . CHOLECYSTECTOMY  2008  . TONSILLECTOMY AND ADENOIDECTOMY  1947    Social History Social History  Substance Use Topics  . Smoking status: Former Smoker    Packs/day: 1.00    Years: 59.00    Types: Cigarettes    Quit date: 10/12/2012  . Smokeless tobacco: Never Used  . Alcohol use 1.0 oz/week    2 Standard drinks or equivalent per week    Family History Family History  Problem Relation Age of Onset  . Arthritis Mother   . Lung cancer Brother     was a  smoker  . Breast cancer Maternal Grandmother   . Heart disease Maternal Grandfather   . Asthma Father   . Emphysema Father   . Heart disease Paternal Grandfather      No Known Allergies   REVIEW OF SYSTEMS (Negative unless checked)  Constitutional: [] Weight loss  [] Fever  [] Chills Cardiac: [] Chest pain   [] Chest pressure   [] Palpitations   [] Shortness of breath when laying flat   [] Shortness of breath at rest   [] Shortness of breath with exertion. Vascular:  [] Pain in legs with walking   [] Pain in legs at rest   [] Pain in legs when laying flat   [] Claudication   [] Pain in feet when walking  [] Pain in feet at rest  [] Pain in feet when laying flat   [x] History of DVT   [] Phlebitis   [x] Swelling in legs   [] Varicose veins   [] Non-healing ulcers Pulmonary:   [] Uses home oxygen   [] Productive cough   [] Hemoptysis   [] Wheeze  [x] COPD   [] Asthma Neurologic:  [] Dizziness  [] Blackouts   [] Seizures   [] History of stroke   [] History of TIA  [] Aphasia   [] Temporary blindness   [] Dysphagia   [] Weakness or numbness in arms   [] Weakness or numbness in legs Musculoskeletal:  [] Arthritis   [] Joint swelling   [] Joint pain   []   Low back pain Hematologic:  [] Easy bruising  [] Easy bleeding   [] Hypercoagulable state   [] Anemic   Gastrointestinal:  [] Blood in stool   [] Vomiting blood  [] Gastroesophageal reflux/heartburn   [] Abdominal pain Genitourinary:  [] Chronic kidney disease   [] Difficult urination  [] Frequent urination  [] Burning with urination   [] Hematuria Skin:  [] Rashes   [] Ulcers   [] Wounds Psychological:  [] History of anxiety   []  History of major depression.  Physical Examination  BP 117/77   Pulse 62   Resp 16   Ht 6\' 3"  (1.905 m)   Wt 248 lb (112.5 kg)   BMI 31.00 kg/m  Gen:  WD/WN, NAD Head: [place]/AT, No temporalis wasting. Ear/Nose/Throat: Hearing grossly intact, nares w/o erythema or drainage, trachea midline Eyes: Conjunctiva clear. Sclera non-icteric Neck: Supple.  No JVD.  Pulmonary:   Good air movement, no use of accessory muscles.  Cardiac: RRR, normal S1, S2 Vascular Vessel Right Left  Radial Palpable Palpable                                   Gastrointestinal: soft, non-tender/non-distended. No guarding/reflex.  Musculoskeletal: M/S 5/5 throughout.  No deformity or atrophy. 1-2+ bilateral lower extremity edema. Moderate stasis dermatitis present bilaterally. Neurologic: Sensation grossly intact in extremities.  Symmetrical.  Speech is fluent.  Psychiatric: Judgment intact, Mood & affect appropriate for pt's clinical situation. Dermatologic: No rashes or ulcers noted.  No cellulitis or open wounds. Lymph : No Cervical, Axillary, or Inguinal lymphadenopathy.      Labs No results found for this or any previous visit (from the past 2160 hour(s)).  Radiology No results found.   Assessment/Plan  Recurrent pulmonary embolism [facility]) Patient desires to keep his filter and remain on anticoagulation indefinitely.  His duplex shows a patent IVC and iliac veins with a filter in place.  Plan to follow up in one year.    COPD (chronic obstructive pulmonary disease) with emphysema (HCC) stable  Swelling of limb Stable. 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 This is stable. He says is not that bothersome to him. Continue compression stockings and elevation as tolerated.    Leotis Pain, MD  08/27/2016 11:01 AM    This note was created with Dragon medical transcription system.  Any errors from dictation are purely unintentional

## 2016-08-27 NOTE — Telephone Encounter (Signed)
lmtcb x1 for pt. 

## 2016-08-27 NOTE — Assessment & Plan Note (Signed)
The patient has chronic swelling after extensive thrombotic issues previously. This is stable. He says is not that bothersome to him. Continue compression stockings and elevation as tolerated.

## 2016-08-27 NOTE — Assessment & Plan Note (Signed)
stable °

## 2016-08-27 NOTE — Assessment & Plan Note (Signed)
Patient desires to keep his filter and remain on anticoagulation indefinitely.  His duplex shows a patent IVC and iliac veins with a filter in place.  Plan to follow up in one year.

## 2016-08-27 NOTE — Assessment & Plan Note (Signed)
Stable. Compression and elevation

## 2016-08-29 MED ORDER — UMECLIDINIUM BROMIDE 62.5 MCG/INH IN AEPB
1.0000 | INHALATION_SPRAY | Freq: Every day | RESPIRATORY_TRACT | 11 refills | Status: DC
Start: 2016-08-29 — End: 2021-10-24

## 2016-08-29 NOTE — Telephone Encounter (Signed)
I'm OK with PCP changing him to eliquis or pradaxa, both are fine  For inhaler: would use Incruise once daily

## 2016-08-29 NOTE — Telephone Encounter (Signed)
Sure no problem.  We will need to coordinate this with his pharmacist however because you have to take a special starting dose of eliquis when making the change.  Have him call us to help coordinate with his pharmacist.

## 2016-08-29 NOTE — Telephone Encounter (Signed)
Spoke with the pt  He states that the spiriva and xarelto are no longer being covered by his insurance  Covered alternatives for spiriva are advair, breo, incruse or anoro  Covered alternatives for xarelto are eliquis or pradaxa  Pt states he was seen recently by Dr Lucky Cowboy and that he would be okay with either anticoag med and would write rx for this, but pt wants BQ's opinion  Please advise, thanks

## 2016-08-29 NOTE — Telephone Encounter (Signed)
lmomtcb x 2  

## 2016-08-29 NOTE — Telephone Encounter (Signed)
Patient returned call, CB is [phone].

## 2016-08-29 NOTE — Telephone Encounter (Signed)
Called and spoke with pt and he is aware of BQ recs of medication and the incruse has been sent to his pharmacy to hold until Jan 2018.    Pt stated that BQ is the one that writes for his xarelto now and that he would prefer that BQ continue to follow him for this.  BQ please advise. thanks

## 2016-08-30 ENCOUNTER — Other Ambulatory Visit: Payer: Self-pay

## 2016-08-30 NOTE — Telephone Encounter (Signed)
Called and spoke with the pt and he is aware of lab order that has been placed. He will come in Jan to have this done and will let us know when he is ready for the eliquis to be sent in to the pharmacy.

## 2016-08-30 NOTE — Telephone Encounter (Signed)
He will need to stop the Xarelto. The next day: take Eliquis 10mg  bid x7 days, then 5mg  bid afterwards Needs BMET as well

## 2016-08-30 NOTE — Telephone Encounter (Signed)
BQ  Please advise  Called the pharmacy that they were unable to help coordinate the dose of the eliquis.

## 2016-10-15 ENCOUNTER — Other Ambulatory Visit: Payer: Self-pay | Admitting: Internal Medicine

## 2017-01-31 ENCOUNTER — Encounter: Payer: Self-pay | Admitting: Internal Medicine

## 2017-01-31 ENCOUNTER — Ambulatory Visit (INDEPENDENT_AMBULATORY_CARE_PROVIDER_SITE_OTHER): Payer: Medicare Other | Admitting: Internal Medicine

## 2017-01-31 VITALS — BP 122/84 | HR 85 | Temp 97.9°F | Resp 16 | Ht 75.0 in | Wt 244.2 lb

## 2017-01-31 DIAGNOSIS — E559 Vitamin D deficiency, unspecified: Secondary | ICD-10-CM | POA: Diagnosis not present

## 2017-01-31 DIAGNOSIS — J432 Centrilobular emphysema: Secondary | ICD-10-CM

## 2017-01-31 DIAGNOSIS — I878 Other specified disorders of veins: Secondary | ICD-10-CM | POA: Diagnosis not present

## 2017-01-31 DIAGNOSIS — G4700 Insomnia, unspecified: Secondary | ICD-10-CM

## 2017-01-31 DIAGNOSIS — Z95828 Presence of other vascular implants and grafts: Secondary | ICD-10-CM

## 2017-01-31 DIAGNOSIS — E7800 Pure hypercholesterolemia, unspecified: Secondary | ICD-10-CM | POA: Diagnosis not present

## 2017-01-31 DIAGNOSIS — E786 Lipoprotein deficiency: Secondary | ICD-10-CM

## 2017-01-31 DIAGNOSIS — Z7901 Long term (current) use of anticoagulants: Secondary | ICD-10-CM

## 2017-01-31 DIAGNOSIS — E785 Hyperlipidemia, unspecified: Secondary | ICD-10-CM | POA: Diagnosis not present

## 2017-01-31 DIAGNOSIS — M7989 Other specified soft tissue disorders: Secondary | ICD-10-CM | POA: Diagnosis not present

## 2017-01-31 DIAGNOSIS — Z1211 Encounter for screening for malignant neoplasm of colon: Secondary | ICD-10-CM | POA: Diagnosis not present

## 2017-01-31 LAB — CBC WITH DIFFERENTIAL/PLATELET
BASOS ABS: 0 10*3/uL (ref 0.0–0.1)
BASOS PCT: 0.6 % (ref 0.0–3.0)
EOS ABS: 0.1 10*3/uL (ref 0.0–0.7)
Eosinophils Relative: 2.2 % (ref 0.0–5.0)
HCT: 44.4 % (ref 39.0–52.0)
HEMOGLOBIN: 15.1 g/dL (ref 13.0–17.0)
Lymphocytes Relative: 35.3 % (ref 12.0–46.0)
Lymphs Abs: 2.3 10*3/uL (ref 0.7–4.0)
MCHC: 34 g/dL (ref 30.0–36.0)
MCV: 90.9 fl (ref 78.0–100.0)
Monocytes Absolute: 0.6 10*3/uL (ref 0.1–1.0)
Monocytes Relative: 9.2 % (ref 3.0–12.0)
Neutro Abs: 3.5 10*3/uL (ref 1.4–7.7)
Neutrophils Relative %: 52.7 % (ref 43.0–77.0)
Platelets: 215 10*3/uL (ref 150.0–400.0)
RBC: 4.88 Mil/uL (ref 4.22–5.81)
RDW: 13.5 % (ref 11.5–15.5)
WBC: 6.6 10*3/uL (ref 4.0–10.5)

## 2017-01-31 LAB — COMPREHENSIVE METABOLIC PANEL
ALT: 18 U/L (ref 0–53)
AST: 17 U/L (ref 0–37)
Albumin: 4.3 g/dL (ref 3.5–5.2)
Alkaline Phosphatase: 67 U/L (ref 39–117)
BUN: 10 mg/dL (ref 6–23)
CO2: 31 meq/L (ref 19–32)
CREATININE: 0.87 mg/dL (ref 0.40–1.50)
Calcium: 9.7 mg/dL (ref 8.4–10.5)
Chloride: 106 mEq/L (ref 96–112)
GFR: 90.47 mL/min (ref >60.00)
GLUCOSE: 106 mg/dL — AB (ref 70–99)
Potassium: 4.8 mEq/L (ref 3.5–5.1)
Sodium: 141 mEq/L (ref 135–145)
Total Bilirubin: 0.6 mg/dL (ref 0.2–1.2)
Total Protein: 7.4 g/dL (ref 6.0–8.3)

## 2017-01-31 LAB — LIPID PANEL
CHOL/HDL RATIO: 5
Cholesterol: 175 mg/dL (ref 0–200)
HDL: 35.1 mg/dL — AB (ref >39.00)
LDL Cholesterol: 124 mg/dL — ABNORMAL HIGH (ref 0–99)
NONHDL: 139.66
Triglycerides: 77 mg/dL (ref 0.0–149.0)
VLDL: 15.4 mg/dL (ref 0.0–40.0)

## 2017-01-31 LAB — VITAMIN D 25 HYDROXY (VIT D DEFICIENCY, FRACTURES): VITD: 15.44 ng/mL — ABNORMAL LOW (ref 30.00–100.00)

## 2017-01-31 MED ORDER — IPRATROPIUM-ALBUTEROL 20-100 MCG/ACT IN AERS: 1.0000 | INHALATION_SPRAY | Freq: Four times a day (QID) | RESPIRATORY_TRACT | 11 refills | Status: DC

## 2017-01-31 MED ORDER — APIXABAN 5 MG PO TABS: 5.0000 mg | ORAL_TABLET | Freq: Two times a day (BID) | ORAL | 11 refills | Status: DC

## 2017-01-31 NOTE — Progress Notes (Signed)
Subjective:  Patient ID: Seth Perez., male    DOB: Jan 28, 1941  Age: 76 y.o. MRN: [id number]  CC: The primary encounter diagnosis was Vitamin D deficiency. 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  HPI [name] presents for annual follow up.  He has a history of COPD,respiratory failure secondary to bilateral recurrent PE on Xarelto,  hyperlipidemia (untreated) and obesity  Last seen in June   Recurrent  DVT/PE:  S/p IVC filter in 2015, still in place per patient preference,  anticoagulated with Xarelto.  IVC filter placement moniotred annually by [facility] )Dec 2017) [name] has been denied by insurance.  Dr Lucky Cowboy recommended Eliquis but did not make medication change and patient will run out before he sees [facility] who typically manages his medications    Chronic insomnia:  Has Fitful sleep and erratic habits. .  Naps during the day .  Watches tv until late, sometimes 3:00 am  No nocturia,  No excessive worrying. Sleeps in a recliner. Told that he snores..no prior testing for sleep apnea .Discussed testing for sleep apnea.  Not particularly interested   COPD: His Spiriva and.  Dr Pennie Banter to handtle the medication changes   Edema: chronic venous insufficiency,  He is  not wearing stockings today .  No ulcers.   Hard of hearing . Has not worn his hearing aid in years .  Worse on the left.   Outpatient Medications Prior to Visit  Medication Sig Dispense Refill  . umeclidinium bromide (INCRUSE ELLIPTA) 62.5 MCG/INH AEPB Inhale 1 puff into the lungs daily. 30 each 11  . SPIRIVA HANDIHALER 18 MCG inhalation capsule INHALE 1 CAPSULE VIA HANDIHALER ONCE DAILY AT THE SAME TIME EVERY  DAY 30 capsule 5  . XARELTO 20 MG TABS tablet TAKE 1 TABLET BY MOUTH EVERY DAY WITH SUPPER 30 tablet 5  . albuterol (VENTOLIN HFA) 108 (90 Base) MCG/ACT inhaler Inhale 2 puffs into the lungs every 6 (six) hours as needed for wheezing. Reported on 02/28/2016 (Patient not taking: Reported on 01/31/2017) 3.7 g 5  . tamsulosin (FLOMAX) 0.4 MG CAPS capsule Take 1 capsule (0.4 mg total) by mouth daily. (Patient not taking: Reported on 01/31/2017) 30 capsule 0   No facility-administered medications prior to visit.     Review of Systems;  Patient denies headache, fevers, malaise, unintentional weight loss, skin rash, eye pain, sinus congestion and sinus pain, sore throat, dysphagia,  hemoptysis , cough, dyspnea, wheezing, chest pain, palpitations, orthopnea, edema, abdominal pain, nausea, melena, diarrhea, constipation, flank pain, dysuria, hematuria, urinary  Frequency, nocturia, numbness, tingling, seizures,  Focal weakness, Loss of consciousness,  Tremor, insomnia, depression, anxiety, and suicidal ideation.      Objective:  BP 122/84 (BP Location: Left Arm, Patient Position: Sitting, Cuff Size: Normal)   Pulse 85   Temp 97.9 F (36.6 C) (Oral)   Resp 16   Ht 6\' 3"  (1.905 m)   Wt 244 lb 3.2 oz (110.8 kg)   SpO2 96%   BMI 30.52 kg/m   BP Readings from Last 3 Encounters:  01/31/17 122/84  08/27/16 117/77  08/02/16 122/80    Wt Readings from Last 3 Encounters:  01/31/17 244 lb 3.2 oz (110.8 kg)  08/27/16 248 lb (112.5 kg)  08/02/16  248 lb 1.9 oz (112.5 kg)    General appearance: alert, cooperative and appears stated age Ears: normal TM's and external ear canals both ears Throat: lips, mucosa, and tongue normal; teeth and gums normal Neck: no adenopathy, no carotid bruit, supple, symmetrical, trachea midline and thyroid not enlarged, symmetric, no tenderness/mass/nodules Back: symmetric, no curvature. ROM normal. No CVA tenderness. Lungs: clear to auscultation bilaterally Heart:  regular rate and rhythm, S1, S2 normal, no murmur, click, rub or gallop Abdomen: soft, non-tender; bowel sounds normal; no masses,  no organomegaly Pulses: 2+ and symmetric Skin: Skin color, texture, turgor normal. No rashes or lesions Lymph nodes: Cervical, supraclavicular, and axillary nodes normal.  Lab Results  Component Value Date   HGBA1C 5.7 10/24/2012   HGBA1C 5.8 10/23/2012    Lab Results  Component Value Date   CREATININE 0.87 01/31/2017   CREATININE 0.87 02/28/2016   CREATININE 0.97 08/24/2015    Lab Results  Component Value Date   WBC 6.6 01/31/2017   HGB 15.1 01/31/2017   HCT 44.4 01/31/2017   PLT 215.0 01/31/2017   GLUCOSE 106 (H) 01/31/2017   CHOL 175 01/31/2017   TRIG 77.0 01/31/2017   HDL 35.10 (L) 01/31/2017   LDLCALC 124 (H) 01/31/2017   ALT 18 01/31/2017   AST 17 01/31/2017   NA 141 01/31/2017   K 4.8 01/31/2017   CL 106 01/31/2017   CREATININE 0.87 01/31/2017   BUN 10 01/31/2017   CO2 31 01/31/2017   TSH 1.82 06/17/2014   PSA 11.44 (H) 11/24/2012   INR 1.5 01/26/2014   HGBA1C 5.7 10/24/2012    Ct Chest Lung Cancer Screening Low Dose Wo Contrast  Result Date: 05/21/2016 CLINICAL DATA:  76 year old male with 59 pack year history of smoking. Lung cancer screening. EXAM: CT CHEST WITHOUT CONTRAST LOW-DOSE FOR LUNG CANCER SCREENING TECHNIQUE: Multidetector CT imaging of the chest was performed following the standard protocol without IV contrast. COMPARISON:  Standard CT chest 11/15/2014. FINDINGS: Cardiovascular: The heart size is normal. No pericardial effusion. Coronary artery calcification is noted. Atherosclerotic calcification is noted in the wall of the throracic aorta. Mediastinum/Nodes: No mediastinal lymphadenopathy. No evidence for gross hilar lymphadenopathy although assessment is limited by the lack of intravenous contrast on today's study. The esophagus has normal imaging features. There is no axillary lymphadenopathy. Lungs/Pleura: Multiple  bilateral pulmonary nodules are identified, some of which are calcified. The dominant noncalcified lesion is in the right middle lobe (image 223) with volume derived mean diameter of 4.2 mm. Centrilobular emphysema is associated with chronic bronchial wall thickening. Scattered areas of small airway impaction are evident (see posterior left upper lobe image 144 series 3). Subsegmental atelectasis noted in the left lower lobe. Upper abdomen: Unremarkable. Musculoskeletal: Bone windows reveal no worrisome lytic or sclerotic osseous lesions. IMPRESSION: 1. Lung-RADS Category 2, benign appearance or behavior. Continue annual screening with low-dose chest CT without contrast in 12 months. 2. Emphysema. 3. Coronary artery atherosclerosis. Electronically Signed   By: Misty Stanley M.D.   On: 05/21/2016 16:49    Assessment & Plan:   Problem List Items Addressed This Visit    Vitamin D deficiency - Primary    Severe,  Drisdol prescribed.       Relevant Orders   VITAMIN D 25 Hydroxy (Vit-D Deficiency, Fractures) (Completed)   Swelling of limb    Chronic,  VI secondary to recurrent DVT and IVC placement. Advised to wear compression stockings. And elevate .      S/P insertion of  IVC (inferior vena caval) filter    In good position Dec 2017 per Dew       Lower extremity venous stasis    No ulcerations.  Continue compression and leg elevation       Insomnia    With daytime somnolence, COPD , snoring and LE edema.  Sleep study advised to rle out nocturnal desaturations with or without OSA      Hyperlipidemia with low HDL    Using the Framingham risk calculator,  his 10 year risk of coronary artery disease is 27%. Statin therapy recommended.  Lab Results  Component Value Date   CHOL 175 01/31/2017   HDL 35.10 (L) 01/31/2017   LDLCALC 124 (H) 01/31/2017   TRIG 77.0 01/31/2017   CHOLHDL 5 01/31/2017         Relevant Medications   apixaban (ELIQUIS) 5 MG TABS tablet   COPD (chronic  obstructive pulmonary disease) with emphysema ([place])    He cannot afford to pay for Spiriva out of pocket.  Combivent rx sent to pharmacy ,  follow up with pulmonology for changes       Relevant Medications   Ipratropium-Albuterol (COMBIVENT) 20-100 MCG/ACT AERS respimat   Colon cancer screening    Hyperplastic polyps removed in Nov 2014.  Given his age and chronic anticoagulation,  Will continue screening with cologuard.        Other Visit Diagnoses    Pure hypercholesterolemia       Relevant Medications   apixaban (ELIQUIS) 5 MG TABS tablet   Other Relevant Orders   Lipid panel (Completed)   Comprehensive metabolic panel (Completed)   Encounter for current long-term use of anticoagulants       Relevant Orders   CBC with Differential/Platelet (Completed)     A total of 40 minutes was spent with patient more than half of which was spent in counseling patient on the above mentioned issues , reviewing and explaining recent labs and imaging studies done, and coordination of care.  I have discontinued Mr. Ravis Herne and SPIRIVA HANDIHALER. I am also having him start on apixaban, Ipratropium-Albuterol, and ergocalciferol. Additionally, I am having him maintain his tamsulosin, albuterol, and umeclidinium bromide.  Meds ordered this encounter  Medications  . apixaban (ELIQUIS) 5 MG TABS tablet    Sig: Take 1 tablet (5 mg total) by mouth 2 (two) times daily.    Dispense:  60 tablet    Refill:  11  . Ipratropium-Albuterol (COMBIVENT) 20-100 MCG/ACT AERS respimat    Sig: Inhale 1 puff into the lungs every 6 (six) hours.    Dispense:  4 g    Refill:  11  . ergocalciferol (DRISDOL) 50000 units capsule    Sig: Take 1 capsule (50,000 Units total) by mouth once a week.    Dispense:  4 capsule    Refill:  3    Medications Discontinued During This Encounter  Medication Reason  . XARELTO 20 MG TABS tablet Formulary change  . SPIRIVA HANDIHALER 18 MCG inhalation capsule Formulary  change    Follow-up: No Follow-up on file.   [name], MD

## 2017-01-31 NOTE — Patient Instructions (Addendum)
I have prescribed the alternative anticoagulation medication called Eliquis.  .  Eliquis is taken twice daily   I HAVE SENT IN AN RX FOR COMBIVENT.  THIS IS  A LOT LIKE SPIRIVA , BUT HAS TO BE USED EVERY 6 HOURS TO BE SIMILAR.     I have also ordered a sleep study to investigate your sleeping issues (to rule out sleep apnea)

## 2017-02-02 DIAGNOSIS — E559 Vitamin D deficiency, unspecified: Secondary | ICD-10-CM | POA: Insufficient documentation

## 2017-02-02 DIAGNOSIS — E786 Lipoprotein deficiency: Secondary | ICD-10-CM

## 2017-02-02 DIAGNOSIS — E785 Hyperlipidemia, unspecified: Secondary | ICD-10-CM | POA: Insufficient documentation

## 2017-02-02 DIAGNOSIS — Z95828 Presence of other vascular implants and grafts: Secondary | ICD-10-CM | POA: Insufficient documentation

## 2017-02-02 DIAGNOSIS — G4700 Insomnia, unspecified: Secondary | ICD-10-CM | POA: Insufficient documentation

## 2017-02-02 MED ORDER — ERGOCALCIFEROL 1.25 MG (50000 UT) PO CAPS: 50000.0000 [IU] | ORAL_CAPSULE | ORAL | 3 refills | Status: DC

## 2017-02-02 NOTE — Assessment & Plan Note (Signed)
With daytime somnolence, COPD , snoring and LE edema.  Sleep study advised to rle out nocturnal desaturations with or without OSA

## 2017-02-02 NOTE — Assessment & Plan Note (Signed)
Hyperplastic polyps removed in Nov 2014.  Given his age and chronic anticoagulation,  Will continue screening with cologuard.

## 2017-02-02 NOTE — Assessment & Plan Note (Addendum)
No ulcerations.  Continue compression and leg elevation

## 2017-02-02 NOTE — Assessment & Plan Note (Signed)
Severe,  Drisdol prescribed.

## 2017-02-02 NOTE — Assessment & Plan Note (Signed)
In good position Dec 2017 per [facility]

## 2017-02-02 NOTE — Assessment & Plan Note (Signed)
He cannot afford to pay for Spiriva out of pocket.  Combivent rx sent to pharmacy ,  follow up with pulmonology for changes

## 2017-02-02 NOTE — Assessment & Plan Note (Signed)
Using the Framingham risk calculator,  his 10 year risk of coronary artery disease is 27%. Statin therapy recommended.  Lab Results  Component Value Date   CHOL 175 01/31/2017   HDL 35.10 (L) 01/31/2017   LDLCALC 124 (H) 01/31/2017   TRIG 77.0 01/31/2017   CHOLHDL 5 01/31/2017

## 2017-02-02 NOTE — Assessment & Plan Note (Signed)
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 Advised to wear compression stockings. And elevate .

## 2017-02-06 ENCOUNTER — Other Ambulatory Visit: Payer: Self-pay | Admitting: Internal Medicine

## 2017-02-06 DIAGNOSIS — Z79899 Other long term (current) drug therapy: Secondary | ICD-10-CM

## 2017-02-06 MED ORDER — SIMVASTATIN 20 MG PO TABS: 20.0000 mg | ORAL_TABLET | Freq: Every day | ORAL | 0 refills | Status: DC

## 2017-03-19 ENCOUNTER — Other Ambulatory Visit (INDEPENDENT_AMBULATORY_CARE_PROVIDER_SITE_OTHER): Payer: Medicare Other

## 2017-03-19 DIAGNOSIS — R911 Solitary pulmonary nodule: Secondary | ICD-10-CM

## 2017-03-19 DIAGNOSIS — Z79899 Other long term (current) drug therapy: Secondary | ICD-10-CM | POA: Diagnosis not present

## 2017-03-19 LAB — BASIC METABOLIC PANEL
BUN: 13 mg/dL (ref 6–23)
CALCIUM: 9.5 mg/dL (ref 8.4–10.5)
CHLORIDE: 105 meq/L (ref 96–112)
CO2: 32 meq/L (ref 19–32)
CREATININE: 0.91 mg/dL (ref 0.40–1.50)
GFR: 85.87 mL/min (ref >60.00)
GLUCOSE: 100 mg/dL — AB (ref 70–99)
Potassium: 4.2 mEq/L (ref 3.5–5.1)
SODIUM: 140 meq/L (ref 135–145)

## 2017-03-19 LAB — HEPATIC FUNCTION PANEL
ALBUMIN: 4.2 g/dL (ref 3.5–5.2)
ALK PHOS: 65 U/L (ref 39–117)
ALT: 20 U/L (ref 0–53)
AST: 20 U/L (ref 0–37)
Bilirubin, Direct: 0.1 mg/dL (ref 0.0–0.3)
TOTAL PROTEIN: 6.9 g/dL (ref 6.0–8.3)
Total Bilirubin: 0.6 mg/dL (ref 0.2–1.2)

## 2017-04-08 ENCOUNTER — Other Ambulatory Visit: Payer: Self-pay

## 2017-04-08 MED ORDER — ERGOCALCIFEROL 1.25 MG (50000 UT) PO CAPS: 50000.0000 [IU] | ORAL_CAPSULE | ORAL | 3 refills | Status: DC

## 2017-05-06 ENCOUNTER — Other Ambulatory Visit: Payer: Self-pay | Admitting: Internal Medicine

## 2017-05-07 ENCOUNTER — Telehealth: Payer: Self-pay | Admitting: *Deleted

## 2017-05-07 DIAGNOSIS — Z87891 Personal history of nicotine dependence: Secondary | ICD-10-CM

## 2017-05-07 DIAGNOSIS — Z122 Encounter for screening for malignant neoplasm of respiratory organs: Secondary | ICD-10-CM

## 2017-05-07 NOTE — Telephone Encounter (Signed)
Notified patient that annual lung cancer screening low dose CT scan is due currently or will be in near future. Confirmed that patient is within the age range of 55-77, and asymptomatic, (no signs or symptoms of lung cancer). Patient denies illness that would prevent curative treatment for lung cancer if found. Verified smoking history, (former, quit 2014, 59 pack year). The shared decision making visit was done 05/21/16. Patient is agreeable for CT scan being scheduled.

## 2017-05-22 ENCOUNTER — Ambulatory Visit
Admission: RE | Admit: 2017-05-22 | Discharge: 2017-05-22 | Disposition: A | Discharge status: Home / Self Care | Payer: Medicare Other | Source: Ambulatory Visit | Attending: Oncology | Admitting: Oncology

## 2017-05-22 DIAGNOSIS — I2510 Atherosclerotic heart disease of native coronary artery without angina pectoris: Secondary | ICD-10-CM | POA: Diagnosis not present

## 2017-05-22 DIAGNOSIS — Z122 Encounter for screening for malignant neoplasm of respiratory organs: Secondary | ICD-10-CM | POA: Diagnosis not present

## 2017-05-22 DIAGNOSIS — R918 Other nonspecific abnormal finding of lung field: Secondary | ICD-10-CM | POA: Diagnosis not present

## 2017-05-22 DIAGNOSIS — Z9049 Acquired absence of other specified parts of digestive tract: Secondary | ICD-10-CM | POA: Diagnosis not present

## 2017-05-22 DIAGNOSIS — I700 Atherosclerosis of aorta: Secondary | ICD-10-CM | POA: Insufficient documentation

## 2017-05-22 DIAGNOSIS — J432 Centrilobular emphysema: Secondary | ICD-10-CM | POA: Insufficient documentation

## 2017-05-22 DIAGNOSIS — Z87891 Personal history of nicotine dependence: Secondary | ICD-10-CM | POA: Diagnosis not present

## 2017-05-27 ENCOUNTER — Encounter: Payer: Self-pay | Admitting: *Deleted

## 2017-06-18 ENCOUNTER — Ambulatory Visit (INDEPENDENT_AMBULATORY_CARE_PROVIDER_SITE_OTHER): Payer: Medicare Other | Admitting: Pulmonary Disease

## 2017-06-18 ENCOUNTER — Encounter: Payer: Self-pay | Admitting: Pulmonary Disease

## 2017-06-18 VITALS — BP 122/76 | HR 74 | Ht 76.0 in | Wt 242.8 lb

## 2017-06-18 DIAGNOSIS — Z23 Encounter for immunization: Secondary | ICD-10-CM | POA: Diagnosis not present

## 2017-06-18 DIAGNOSIS — I2699 Other pulmonary embolism without acute cor pulmonale: Secondary | ICD-10-CM

## 2017-06-18 DIAGNOSIS — J432 Centrilobular emphysema: Secondary | ICD-10-CM | POA: Diagnosis not present

## 2017-06-18 NOTE — Patient Instructions (Addendum)
COPD: Continue taking Incruise as you are doing Flu shot today Stay active  For pulmonary embolism: Continue talking Eliquis 5mg  twice a day  Keep up with your lung cancer screening CT scans  We will see you back in 1 year or sooner if needed

## 2017-06-18 NOTE — Progress Notes (Signed)
Subjective:    Patient ID: Seth Perez., male    DOB: 1941/04/06, 76 y.o.   MRN: [id number]  Synopsis: Seth Perez first saw the [facility] pulmonary clinic in March of 2014 for evaluation of pulmonary embolism, a pulmonary nodule and COPD. He had moderate obstruction on pulmonary function testing and was prescribed Spiriva. He had a provoked pulmonary embolism in late February 2014 and was treated for this with Xarelto. He also had a 1.2 cm pleural-based nodule in the right upper lobe.  In may of 2015 he had a repeat large pulmonary embolism requiring thrombolytic therapy and an IVC filter placement. He quit smoking in 2014, 1 ppd for about 57 years.   HPI Chief Complaint  Patient presents with  . Follow-up    Pt states that he has been doing good since last visit. Denies any complaints or concerns. No cough, SOB, or CP.    Seth Perez says that his breathing has been OK.  No episodes of bronchitis or pneumonia since the last visit.    He has been participating in the lung cancer screening program.    He is taking Incruise without difficulty.    No bleeding episodes.  He continues to take Eliquis. He often forgets to take the second dose of Eliquis.     Past Medical History:  Diagnosis Date  . Arthritis   . Chicken pox   . COPD (chronic obstructive pulmonary disease) ([place])   . Pulmonary emboli (HCC)    on Xarelto     Review of Systems  Constitutional: Negative for chills, fatigue and fever.  HENT: Negative for postnasal drip, sinus pressure and sneezing.   Respiratory: Negative for cough, shortness of breath and wheezing.   Cardiovascular: Negative for chest pain, palpitations and leg swelling.       Objective:   Physical Exam  Vitals:   06/18/17 0959  BP: 122/76  Pulse: 74  SpO2: 96%  Weight: 242 lb 12.8 oz (110.1 kg)  Height: 6\' 4"  (1.93 m)  RA  Gen: well appearing HENT: OP clear, TM's clear, neck supple PULM: CTA B, normal percussion CV: RRR,  no mgr, trace edema GI: BS+, soft, nontender Derm: no cyanosis or rash, dressing L leg, clean, dry, intact Psyche: normal mood and affect       Assessment & Plan:   Recurrent pulmonary embolism (HCC)  Centrilobular emphysema (HCC)  Discussion: This has been a stable interval for Seth Perez.  He has been tolerating Eliquis without difficulty and has not had an exacerbatin of COPD.  Because he sometimes forgets to take his nighttime dose of Eliquis I am reluctant to decrease the dose.  Plan: COPD: Continue taking Incruise as you are doing Flu shot today Stay active  For pulmonary embolism: Continue talking Eliquis 5mg  twice a day  Keep up with your lung cancer screening CT scans  We will see you back in 1 year or sooner if needed  > 50% of this 26 minute visit was face to face    Current Outpatient Prescriptions:  .  apixaban (ELIQUIS) 5 MG TABS tablet, Take 1 tablet (5 mg total) by mouth 2 (two) times daily., Disp: 60 tablet, Rfl: 11 .  simvastatin (ZOCOR) 20 MG tablet, TAKE 1 TABLET BY MOUTH AT BEDTIME, Disp: 90 tablet, Rfl: 0 .  umeclidinium bromide (INCRUSE ELLIPTA) 62.5 MCG/INH AEPB, Inhale 1 puff into the lungs daily., Disp: 30 each, Rfl: 11 .  vitamin B-12 (CYANOCOBALAMIN) 1000 MCG tablet, Take 1,000  mcg by mouth daily. Pt takes 2 tabs daily., Disp: , Rfl:  .  VITAMIN D, CHOLECALCIFEROL, PO, Take 5,000 Units by mouth daily., Disp: , Rfl:  .  albuterol (VENTOLIN HFA) 108 (90 Base) MCG/ACT inhaler, Inhale 2 puffs into the lungs every 6 (six) hours as needed for wheezing. Reported on 02/28/2016 (Patient not taking: Reported on 01/31/2017), Disp: 3.7 g, Rfl: 5 .  Ipratropium-Albuterol (COMBIVENT) 20-100 MCG/ACT AERS respimat, Inhale 1 puff into the lungs every 6 (six) hours. (Patient not taking: Reported on 06/18/2017), Disp: 4 g, Rfl: 11

## 2017-08-02 ENCOUNTER — Other Ambulatory Visit: Payer: Self-pay | Admitting: Internal Medicine

## 2017-08-04 ENCOUNTER — Ambulatory Visit (INDEPENDENT_AMBULATORY_CARE_PROVIDER_SITE_OTHER): Payer: Medicare Other

## 2017-08-04 VITALS — BP 126/70 | HR 82 | Temp 97.6°F | Resp 15 | Ht 75.0 in | Wt 242.8 lb

## 2017-08-04 DIAGNOSIS — Z1331 Encounter for screening for depression: Secondary | ICD-10-CM | POA: Diagnosis not present

## 2017-08-04 DIAGNOSIS — Z0000 Encounter for general adult medical examination without abnormal findings: Secondary | ICD-10-CM

## 2017-08-04 NOTE — Progress Notes (Signed)
Subjective:   Seth ARCIDIACONO Sr. is a 76 y.o. male who presents for Medicare Annual/Subsequent preventive examination.  Review of Systems:  No ROS.  Medicare Wellness Visit. Additional risk factors are reflected in the social history.  Cardiac Risk Factors include: advanced age (>54men, >32 women);male gender     Objective:    Vitals: BP 126/70 (BP Location: Left Arm, Patient Position: Sitting, Cuff Size: Normal)   Pulse 82   Temp 97.6 F (36.4 C) (Oral)   Resp 15   Ht 6\' 3"  (1.905 m)   Wt 242 lb 12.8 oz (110.1 kg)   SpO2 97%   BMI 30.35 kg/m   Body mass index is 30.35 kg/m.  Tobacco Social History   Tobacco Use  Smoking Status Former Smoker  . Packs/day: 1.00  . Years: 59.00  . Pack years: 59.00  . Types: Cigarettes  . Last attempt to quit: 10/12/2012  . Years since quitting: 4.8  Smokeless Tobacco Never Used     Counseling given: Not Answered   Past Medical History:  Diagnosis Date  . Arthritis   . Chicken pox   . COPD (chronic obstructive pulmonary disease) ([place])   . Pulmonary emboli ([place])    on Xarelto   Past Surgical History:  Procedure Laterality Date  . CHOLECYSTECTOMY  2008  . TONSILLECTOMY AND ADENOIDECTOMY  1947   Family History  Problem Relation Age of Onset  . Arthritis Mother   . Lung cancer Brother        was a smoker  . Breast cancer Maternal Grandmother   . Heart disease Maternal Grandfather   . Asthma Father   . Emphysema Father   . Heart disease Paternal Grandfather    Social History   Substance and Sexual Activity  Sexual Activity Not Currently    Outpatient Encounter Medications as of 08/04/2017  Medication Sig  . apixaban (ELIQUIS) 5 MG TABS tablet Take 1 tablet (5 mg total) by mouth 2 (two) times daily.  . simvastatin (ZOCOR) 20 MG tablet TAKE 1 TABLET BY MOUTH AT BEDTIME  . umeclidinium bromide (INCRUSE ELLIPTA) 62.5 MCG/INH AEPB Inhale 1 puff into the lungs daily.  . vitamin B-12 (CYANOCOBALAMIN) 1000 MCG tablet  Take 1,000 mcg by mouth daily. Pt takes 2 tabs daily.  Marland Kitchen VITAMIN D, CHOLECALCIFEROL, PO Take 5,000 Units by mouth daily.  Marland Kitchen albuterol (VENTOLIN HFA) 108 (90 Base) MCG/ACT inhaler Inhale 2 puffs into the lungs every 6 (six) hours as needed for wheezing. Reported on 02/28/2016 (Patient not taking: Reported on 08/04/2017)  . Ipratropium-Albuterol (COMBIVENT) 20-100 MCG/ACT AERS respimat Inhale 1 puff into the lungs every 6 (six) hours. (Patient not taking: Reported on 08/04/2017)   No facility-administered encounter medications on file as of 08/04/2017.     Activities of Daily Living In your present state of health, do you have any difficulty performing the following activities: 08/04/2017  Hearing? Y  Comment Audilogy testing deferred per patient preference  Vision? N  Difficulty concentrating or making decisions? N  Walking or climbing stairs? N  Dressing or bathing? N  Doing errands, shopping? N  Preparing Food and eating ? N  Using the Toilet? N  In the past six months, have you accidently leaked urine? N  Do you have problems with loss of bowel control? N  Managing your Medications? N  Managing your Finances? N  Housekeeping or managing your Housekeeping? N  Some recent data might be hidden    Patient Care Team: Deborra Medina  L, MD as PCP - General (Internal Medicine)   Assessment:    This is a routine wellness examination for Seth Perez. The goal of the wellness visit is to assist the patient how to close the gaps in care and create a preventative care plan for the patient.   The roster of all physicians providing medical care to patient is listed in the Snapshot section of the chart.  Taking calcium VIT D as appropriate/Osteoporosis risk reviewed.    Safety issues reviewed; Smoke and carbon monoxide detectors in the home.  Firearms locked up in the home. Wears seatbelts when driving or riding with others. Patient does wear sunscreen or protective clothing when in direct  sunlight. No violence in the home.  Depression- PHQ 2 &9 complete.  No signs/symptoms or verbal communication regarding little pleasure in doing things, feeling down, depressed or hopeless. No changes in sleeping, energy, eating, concentrating.  No thoughts of self harm or harm towards others.  Time spent on this topic is 12 minutes.   Patient is alert, normal appearance, oriented to person/place/and time. Correctly identified the president of the [place], recall of 3/3 words, and performing simple calculations. Displays appropriate judgement and can read correct time from watch face.   No new identified risk were noted.  No failures at ADL's or IADL's.    BMI- discussed the importance of a healthy diet, water intake and the benefits of aerobic exercise. Educational material provided.   Hard of hearing; does not wear hearing aids due to cost.  Dental; no dental due to cost.  Vision; screening declined. Wears corrective lenses. Plans to schedule an eye exam with [facility].    24 hour diet recall: Breakfast: waffle Lunch: none Dinner: double burger, french fries, chili  Low carb foods encouraged.  Educational material provided  Daily fluid intake: 18-24 cups of caffeine, 2 cups of water  Encouraged to increase water intake and decrease caffeine intake  Sleep patterns- Wakes every 2-4 hours.  Naps throughout the day.    Health maintenance gaps- closed.  Patient Concerns: None at this time. Follow up with PCP as needed.  Exercise Activities and Dietary recommendations Current Exercise Habits: Home exercise routine, Type of exercise: walking, Time (Minutes): 30, Frequency (Times/Week): 1, Weekly Exercise (Minutes/Week): 30, Intensity: Mild   Fall Risk Fall Risk  08/04/2017 08/30/2016 08/02/2016 08/30/2015 08/03/2015  Falls in the past year? No Yes Yes Yes Yes  Comment - Emmi Telephone Survey: data to providers prior to load - - -  Number falls in past yr: - 1 1 2  or more 2 or more   Comment - Emmi Telephone Survey Actual Response = 1 - - -  Injury with Fall? - No Yes No No  Comment - - R shoulder was tender after fall.  He sought medical care.  Stable. - 1st falll Wet ground.Slipped but okay.  2nd fall Uneven ground.  Risk Factor Category  - - - High Fall Risk -  Risk for fall due to : - - - Impaired vision;Other (Comment) -  Risk for fall due to: Comment - - - fell over tombstone -  Follow up - - Education provided;Falls prevention discussed Falls prevention discussed Education provided;Falls prevention discussed;Falls evaluation completed  Comment - - Fell playing soccer with grand daughter - -   Depression Screen PHQ 2/9 Scores 08/04/2017 08/02/2016 08/30/2015 08/03/2015  PHQ - 2 Score 0 0 0 0  PHQ- 9 Score 0 - - -    Cognitive Function  MMSE - Mini Mental State Exam 08/04/2017 08/02/2016 08/03/2015  Orientation to time 5 5 5   Orientation to Place 5 5 5   Registration 3 3 3   Attention/ Calculation 5 5 5   Recall 3 3 3   Language- name 2 objects 2 2 2   Language- repeat 1 1 1   Language- follow 3 step command 3 3 3   Language- read & follow direction 1 1 1   Write a sentence 1 1 1   Copy design 1 1 1   Total score 30 30 30      6CIT Screen 08/02/2016  What Year? 0 points  What month? 0 points  What time? 0 points  Count back from 20 0 points  Months in reverse 0 points    Immunization History  Administered Date(s) Administered  . Influenza Split 06/17/2014  . Influenza, High Dose Seasonal PF 08/02/2016, 06/18/2017  . Influenza,inj,Quad PF,6+ Mos 08/03/2015  . Pneumococcal Conjugate-13 06/17/2014  . Pneumococcal Polysaccharide-23 06/18/2007  . Pneumococcal-Unspecified 09/28/2006  . Tdap 06/23/2014   Screening Tests Health Maintenance  Topic Date Due  . TETANUS/TDAP  06/23/2024  . INFLUENZA VACCINE  Completed  . PNA vac Low Risk Adult  Completed      Plan:    End of life planning; Advance aging; Advanced directives discussed. Copy of current  HCPOA/Living Will requested.    I have personally reviewed and noted the following in the patient's chart:   . Medical and social history . Use of alcohol, tobacco or illicit drugs  . Current medications and supplements . Functional ability and status . Nutritional status . Physical activity . Advanced directives . List of other physicians . Hospitalizations, surgeries, and ER visits in previous 12 months . Vitals . Screenings to include cognitive, depression, and falls . Referrals and appointments  In addition, I have reviewed and discussed with patient certain preventive protocols, quality metrics, and best practice recommendations. A written personalized care plan for preventive services as well as general preventive health recommendations were provided to patient.     OBrien-Blaney, Jericca Russett L, LPN  57/32/2025   I have reviewed the above information and agree with above.   Deborra Medina, MD

## 2017-08-04 NOTE — Patient Instructions (Addendum)
  [name] , Thank you for taking time to come for your Medicare Wellness Visit. I appreciate your ongoing commitment to your health goals. Please review the following plan we discussed and let me know if I can assist you in the future.   Follow up with Dr. Derrel Nip as needed.    Bring a copy of your [place] and/or Living Will to be scanned into chart.  Have a great day!  These are the goals we discussed:  Stay active, drink more water and less caffeine, low carb foods   This is a list of the screening recommended for you and due dates:  Health Maintenance  Topic Date Due  . Tetanus Vaccine  06/23/2024  . Flu Shot  Completed  . Pneumonia vaccines  Completed

## 2017-08-27 ENCOUNTER — Other Ambulatory Visit (INDEPENDENT_AMBULATORY_CARE_PROVIDER_SITE_OTHER): Payer: Self-pay | Admitting: Vascular Surgery

## 2017-08-27 DIAGNOSIS — I2699 Other pulmonary embolism without acute cor pulmonale: Secondary | ICD-10-CM

## 2017-08-29 ENCOUNTER — Ambulatory Visit (INDEPENDENT_AMBULATORY_CARE_PROVIDER_SITE_OTHER): Payer: Medicare Other | Admitting: Vascular Surgery

## 2017-08-29 ENCOUNTER — Encounter (INDEPENDENT_AMBULATORY_CARE_PROVIDER_SITE_OTHER): Payer: Self-pay | Admitting: Vascular Surgery

## 2017-08-29 ENCOUNTER — Ambulatory Visit (INDEPENDENT_AMBULATORY_CARE_PROVIDER_SITE_OTHER): Payer: Medicare Other

## 2017-08-29 VITALS — BP 123/79 | HR 63 | Resp 16 | Wt 236.0 lb

## 2017-08-29 DIAGNOSIS — E785 Hyperlipidemia, unspecified: Secondary | ICD-10-CM | POA: Diagnosis not present

## 2017-08-29 DIAGNOSIS — E786 Lipoprotein deficiency: Secondary | ICD-10-CM | POA: Diagnosis not present

## 2017-08-29 DIAGNOSIS — I878 Other specified disorders of veins: Secondary | ICD-10-CM | POA: Diagnosis not present

## 2017-08-29 DIAGNOSIS — Z95828 Presence of other vascular implants and grafts: Secondary | ICD-10-CM

## 2017-08-29 DIAGNOSIS — I2699 Other pulmonary embolism without acute cor pulmonale: Secondary | ICD-10-CM

## 2017-08-29 DIAGNOSIS — I87009 Postthrombotic syndrome without complications of unspecified extremity: Secondary | ICD-10-CM

## 2017-08-29 NOTE — Assessment & Plan Note (Signed)
Would benefit from wearing compression stockings and elevating his legs.

## 2017-08-29 NOTE — Assessment & Plan Note (Signed)
We again recommended that he wear compression stockings and elevate his legs.  I will plan to see him back in a couple of years.

## 2017-08-29 NOTE — Assessment & Plan Note (Signed)
Filter is patent on duplex today.  I will plan to check him every other year with duplex unless worsening problems develop

## 2017-08-29 NOTE — Progress Notes (Signed)
MRN : [id number]  Seth PARRILLO Sr. is a 76 y.o. (Feb 12, 1941) male who presents with chief complaint of  Chief Complaint  Patient presents with  . Follow-up    53yr Aorta Iliac  .  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 He had an IVC filter placed about 3-1/2 years ago and remains on anticoagulation. He has chronic swelling which is stable. He has no new ulceration or infection.  He does not wear his compression stockings were elevate his legs Patient desires to keep his filter and remain on anticoagulation indefinitely.  His duplex shows a patent IVC and iliac veins with a filter in place.           Current Outpatient Prescriptions  Medication Sig Dispense Refill  . albuterol (VENTOLIN HFA) 108 (90 Base) MCG/ACT inhaler Inhale 2 puffs into the lungs every 6 (six) hours as needed for wheezing. Reported on 02/28/2016 3.7 g 5  . tamsulosin (FLOMAX) 0.4 MG CAPS capsule Take 1 capsule (0.4 mg total) by mouth daily. 30 capsule 0  . tiotropium (SPIRIVA HANDIHALER) 18 MCG inhalation capsule INHALE 1 CAPSULE VIA HANDIHALER ONCE DAILY AT THE SAME TIME EVERY DAY 30 capsule 5  . XARELTO 20 MG TABS tablet TAKE 1 TABLET BY MOUTH EVERY DAY WITH SUPPER 30 tablet 5   No current facility-administered medications for this visit.         Past Medical History:  Diagnosis Date  . Arthritis   . Chicken pox   . COPD (chronic obstructive pulmonary disease) ([place])   . Pulmonary emboli ([place])    on Xarelto         Past Surgical History:  Procedure Laterality Date  . CHOLECYSTECTOMY  2008  . TONSILLECTOMY AND ADENOIDECTOMY  1947    Social History       Social History  Substance Use Topics  . Smoking status: Former Smoker    Packs/day: 1.00    Years: 59.00    Types: Cigarettes    Quit date: 10/12/2012  . Smokeless tobacco: Never Used  . Alcohol use 1.0 oz/week     2 Standard drinks or equivalent  per week     Family History       Family History  Problem Relation Age of Onset  . Arthritis Mother   . Lung cancer Brother     was a smoker  . Breast cancer Maternal Grandmother   . Heart disease Maternal Grandfather   . Asthma Father   . Emphysema Father   . Heart disease Paternal Grandfather      No Known Allergies   REVIEW OF SYSTEMS (Negative unless checked)  Constitutional: [] Weight loss  [] Fever  [] Chills Cardiac: [] Chest pain   [] Chest pressure   [] Palpitations   [] Shortness of breath when laying flat   [] Shortness of breath at rest   [] Shortness of breath with exertion. Vascular:  [] Pain in legs with walking   [] Pain in legs at rest   [] Pain in legs when laying flat   [] Claudication   [] Pain in feet when walking  [] Pain in feet at rest  [] Pain in feet when laying flat   [x] History of DVT   [] Phlebitis   [x] Swelling in legs   [] Varicose veins   [] Non-healing ulcers Pulmonary:   [] Uses home oxygen   [] Productive cough   [] Hemoptysis   [] Wheeze  [x] COPD   [] Asthma Neurologic:  [] Dizziness  [] Blackouts   []   Seizures   [] History of stroke   [] History of TIA  [] Aphasia   [] Temporary blindness   [] Dysphagia   [] Weakness or numbness in arms   [] Weakness or numbness in legs Musculoskeletal:  [] Arthritis   [] Joint swelling   [] Joint pain   [] Low back pain Hematologic:  [] Easy bruising  [] Easy bleeding   [] Hypercoagulable state   [] Anemic   Gastrointestinal:  [] Blood in stool   [] Vomiting blood  [] Gastroesophageal reflux/heartburn   [] Abdominal pain Genitourinary:  [] Chronic kidney disease   [] Difficult urination  [] Frequent urination  [] Burning with urination   [] Hematuria Skin:  [] Rashes   [] Ulcers   [] Wounds Psychological:  [] History of anxiety   []  History of major depression     Physical Examination  BP 123/79 (BP Location: Right Arm)   Pulse 63   Resp 16   Wt 107 kg (236 lb)   BMI 29.50 kg/m  Gen:  WD/WN, NAD Head: [place]/AT, No temporalis  wasting. Ear/Nose/Throat: Hearing grossly intact, nares w/o erythema or drainage, trachea midline Eyes: Conjunctiva clear. Sclera non-icteric Neck: Supple.  No JVD.  Pulmonary:  Good air movement, no use of accessory muscles.  Cardiac: RRR, normal S1, S2 Vascular:  Vessel Right Left  Radial Palpable Palpable                          PT  not palpable  not palpable  DP  1+ palpable  1+ palpable    Musculoskeletal: M/S 5/5 throughout.  No deformity or atrophy.  1-2+ bilateral lower extremity edema.  Moderate stasis dermatitis changes are present bilaterally Neurologic: Sensation grossly intact in extremities.  Symmetrical.  Speech is fluent.  Psychiatric: Judgment intact, Mood & affect appropriate for pt's clinical situation. Dermatologic: No rashes or ulcers noted.  No cellulitis or open wounds.       Labs No results found for this or any previous visit (from the past 2160 hour(s)).  Radiology No results found.    Assessment/Plan  Post-phlebitic syndrome We again recommended that he wear compression stockings and elevate his legs.  I will plan to see him back in a couple of years.  Lower extremity venous stasis Would benefit from wearing compression stockings and elevating his legs.  S/P insertion of IVC (inferior vena caval) filter Filter is patent on duplex today.  I will plan to check him every other year with duplex unless worsening problems develop  Hyperlipidemia with low HDL lipid control important in reducing the progression of atherosclerotic disease. Continue statin therapy     Leotis Pain, MD  08/29/2017 10:20 AM    This note was created with Dragon medical transcription system.  Any errors from dictation are purely unintentional

## 2017-08-29 NOTE — Assessment & Plan Note (Signed)
lipid control important in reducing the progression of atherosclerotic disease. Continue statin therapy  

## 2017-09-19 ENCOUNTER — Emergency Department: Payer: Medicare Other

## 2017-09-19 ENCOUNTER — Inpatient Hospital Stay
Admission: EM | Admit: 2017-09-19 | Discharge: 2017-09-23 | DRG: 193 | Disposition: A | Discharge status: Home / Self Care | Payer: Medicare Other | Attending: Internal Medicine | Admitting: Internal Medicine

## 2017-09-19 ENCOUNTER — Encounter: Payer: Self-pay | Admitting: Emergency Medicine

## 2017-09-19 ENCOUNTER — Other Ambulatory Visit: Payer: Self-pay

## 2017-09-19 DIAGNOSIS — Z825 Family history of asthma and other chronic lower respiratory diseases: Secondary | ICD-10-CM | POA: Diagnosis not present

## 2017-09-19 DIAGNOSIS — E786 Lipoprotein deficiency: Secondary | ICD-10-CM

## 2017-09-19 DIAGNOSIS — J189 Pneumonia, unspecified organism: Secondary | ICD-10-CM | POA: Diagnosis not present

## 2017-09-19 DIAGNOSIS — Z7951 Long term (current) use of inhaled steroids: Secondary | ICD-10-CM | POA: Diagnosis not present

## 2017-09-19 DIAGNOSIS — Z87891 Personal history of nicotine dependence: Secondary | ICD-10-CM | POA: Diagnosis not present

## 2017-09-19 DIAGNOSIS — Z9049 Acquired absence of other specified parts of digestive tract: Secondary | ICD-10-CM | POA: Diagnosis not present

## 2017-09-19 DIAGNOSIS — J449 Chronic obstructive pulmonary disease, unspecified: Secondary | ICD-10-CM | POA: Diagnosis not present

## 2017-09-19 DIAGNOSIS — J9601 Acute respiratory failure with hypoxia: Secondary | ICD-10-CM | POA: Diagnosis present

## 2017-09-19 DIAGNOSIS — J181 Lobar pneumonia, unspecified organism: Secondary | ICD-10-CM

## 2017-09-19 DIAGNOSIS — Z7901 Long term (current) use of anticoagulants: Secondary | ICD-10-CM | POA: Diagnosis not present

## 2017-09-19 DIAGNOSIS — R0602 Shortness of breath: Secondary | ICD-10-CM | POA: Diagnosis not present

## 2017-09-19 DIAGNOSIS — R0902 Hypoxemia: Secondary | ICD-10-CM | POA: Diagnosis not present

## 2017-09-19 DIAGNOSIS — J440 Chronic obstructive pulmonary disease with acute lower respiratory infection: Secondary | ICD-10-CM | POA: Diagnosis present

## 2017-09-19 DIAGNOSIS — E785 Hyperlipidemia, unspecified: Secondary | ICD-10-CM | POA: Diagnosis present

## 2017-09-19 DIAGNOSIS — Z8249 Family history of ischemic heart disease and other diseases of the circulatory system: Secondary | ICD-10-CM

## 2017-09-19 DIAGNOSIS — Z86711 Personal history of pulmonary embolism: Secondary | ICD-10-CM | POA: Diagnosis not present

## 2017-09-19 DIAGNOSIS — J441 Chronic obstructive pulmonary disease with (acute) exacerbation: Secondary | ICD-10-CM | POA: Diagnosis not present

## 2017-09-19 DIAGNOSIS — R7301 Impaired fasting glucose: Secondary | ICD-10-CM | POA: Diagnosis present

## 2017-09-19 DIAGNOSIS — Z9981 Dependence on supplemental oxygen: Secondary | ICD-10-CM | POA: Diagnosis not present

## 2017-09-19 LAB — COMPREHENSIVE METABOLIC PANEL
ALBUMIN: 4.1 g/dL (ref 3.5–5.0)
ALT: 18 U/L (ref 17–63)
AST: 20 U/L (ref 15–41)
Alkaline Phosphatase: 67 U/L (ref 38–126)
Anion gap: 10 (ref 5–15)
BUN: 11 mg/dL (ref 6–20)
CHLORIDE: 103 mmol/L (ref 101–111)
CO2: 24 mmol/L (ref 22–32)
CREATININE: 0.87 mg/dL (ref 0.61–1.24)
Calcium: 8.5 mg/dL — ABNORMAL LOW (ref 8.9–10.3)
GFR calc Af Amer: >60 mL/min (ref >60)
GFR calc non Af Amer: >60 mL/min (ref >60)
GLUCOSE: 108 mg/dL — AB (ref 65–99)
Potassium: 3.6 mmol/L (ref 3.5–5.1)
SODIUM: 137 mmol/L (ref 135–145)
Total Bilirubin: 1.6 mg/dL — ABNORMAL HIGH (ref 0.3–1.2)
Total Protein: 7.3 g/dL (ref 6.5–8.1)

## 2017-09-19 LAB — CBC
HCT: 44 % (ref 40.0–52.0)
HEMOGLOBIN: 15 g/dL (ref 13.0–18.0)
MCH: 30.5 pg (ref 26.0–34.0)
MCHC: 34 g/dL (ref 32.0–36.0)
MCV: 89.5 fL (ref 80.0–100.0)
PLATELETS: 152 10*3/uL (ref 150–440)
RBC: 4.91 MIL/uL (ref 4.40–5.90)
RDW: 13.2 % (ref 11.5–14.5)
WBC: 14.8 10*3/uL — AB (ref 3.8–10.6)

## 2017-09-19 LAB — BRAIN NATRIURETIC PEPTIDE: B NATRIURETIC PEPTIDE 5: 33 pg/mL (ref 0.0–100.0)

## 2017-09-19 LAB — TROPONIN I: Troponin I: <0.03 ng/mL (ref <0.03)

## 2017-09-19 MED ORDER — UMECLIDINIUM BROMIDE 62.5 MCG/INH IN AEPB
1.0000 | INHALATION_SPRAY | Freq: Every day | RESPIRATORY_TRACT | Status: DC
  Administered 2017-09-20 – 2017-09-23 (×4): 1 via RESPIRATORY_TRACT
  Filled 2017-09-19: qty 7

## 2017-09-19 MED ORDER — METHYLPREDNISOLONE SODIUM SUCC 125 MG IJ SOLR
60.0000 mg | Freq: Four times a day (QID) | INTRAMUSCULAR | Status: DC
  Administered 2017-09-19 – 2017-09-20 (×3): 60 mg via INTRAVENOUS
  Filled 2017-09-19 (×3): qty 2

## 2017-09-19 MED ORDER — ONDANSETRON HCL 4 MG/2ML IJ SOLN
4.0000 mg | Freq: Four times a day (QID) | INTRAMUSCULAR | Status: DC | PRN
Start: 2017-09-19 — End: 2017-09-23

## 2017-09-19 MED ORDER — ACETAMINOPHEN 650 MG RE SUPP: 650.0000 mg | Freq: Four times a day (QID) | RECTAL | Status: DC | PRN

## 2017-09-19 MED ORDER — SIMVASTATIN 20 MG PO TABS
20.0000 mg | ORAL_TABLET | Freq: Every day | ORAL | Status: DC
  Administered 2017-09-20 – 2017-09-23 (×4): 20 mg via ORAL
  Filled 2017-09-19 (×4): qty 1

## 2017-09-19 MED ORDER — BENZONATATE 100 MG PO CAPS: 200.0000 mg | ORAL_CAPSULE | Freq: Three times a day (TID) | ORAL | Status: DC | PRN

## 2017-09-19 MED ORDER — ACETAMINOPHEN 325 MG PO TABS: 650.0000 mg | ORAL_TABLET | Freq: Four times a day (QID) | ORAL | Status: DC | PRN

## 2017-09-19 MED ORDER — IPRATROPIUM-ALBUTEROL 0.5-2.5 (3) MG/3ML IN SOLN
3.0000 mL | Freq: Once | RESPIRATORY_TRACT | Status: AC
  Administered 2017-09-19: 3 mL via RESPIRATORY_TRACT

## 2017-09-19 MED ORDER — IPRATROPIUM-ALBUTEROL 0.5-2.5 (3) MG/3ML IN SOLN
RESPIRATORY_TRACT | Status: AC
  Administered 2017-09-19: 3 mL via RESPIRATORY_TRACT
  Filled 2017-09-19: qty 3

## 2017-09-19 MED ORDER — DEXTROSE 5 % IV SOLN
500.0000 mg | INTRAVENOUS | Status: DC
  Administered 2017-09-20: 500 mg via INTRAVENOUS
  Filled 2017-09-19 (×2): qty 500

## 2017-09-19 MED ORDER — IPRATROPIUM-ALBUTEROL 0.5-2.5 (3) MG/3ML IN SOLN: 3.0000 mL | RESPIRATORY_TRACT | Status: DC | PRN

## 2017-09-19 MED ORDER — CEFTRIAXONE SODIUM 1 G IJ SOLR
1.0000 g | INTRAMUSCULAR | Status: DC
  Administered 2017-09-19 – 2017-09-22 (×4): 1 g via INTRAVENOUS
  Filled 2017-09-19 (×5): qty 10

## 2017-09-19 MED ORDER — LEVOFLOXACIN IN D5W 750 MG/150ML IV SOLN
750.0000 mg | Freq: Once | INTRAVENOUS | Status: AC
  Administered 2017-09-19: 750 mg via INTRAVENOUS
  Filled 2017-09-19: qty 150

## 2017-09-19 MED ORDER — APIXABAN 5 MG PO TABS
5.0000 mg | ORAL_TABLET | Freq: Two times a day (BID) | ORAL | Status: DC
  Administered 2017-09-20 – 2017-09-23 (×7): 5 mg via ORAL
  Filled 2017-09-19 (×7): qty 1

## 2017-09-19 MED ORDER — ONDANSETRON HCL 4 MG PO TABS: 4.0000 mg | ORAL_TABLET | Freq: Four times a day (QID) | ORAL | Status: DC | PRN

## 2017-09-19 MED ORDER — GUAIFENESIN-DM 100-10 MG/5ML PO SYRP
5.0000 mL | ORAL_SOLUTION | ORAL | Status: DC | PRN
  Administered 2017-09-20 – 2017-09-21 (×2): 5 mL via ORAL
  Filled 2017-09-19 (×3): qty 5

## 2017-09-19 NOTE — ED Provider Notes (Signed)
[facility] Emergency Department Provider Note  Time seen: 7:40 PM  I have reviewed the triage vital signs and the nursing notes.   HISTORY  Chief Complaint Shortness of Breath    HPI Seth Perez. is a 76 y.o. male with a past medical history of COPD, multiple pulmonary embolisms in the past on Eliquis, presents to the emergency department for shortness of breath.  According to the patient over the past 3 days he has had progressively worsening shortness of breath especially with exertion.  Denies any chest pain.  Denies any fever does state cough with congestion over the past several days as well but denies any sputum production.  Denies abdominal pain, nausea vomiting or diarrhea.  EMS states initial room air saturation in the low 80s, started on oxygen therapy, given 2 DuoNeb nebulizer treatments as well as 125 mg of Solu-Medrol in route to the hospital.  Upon arrival the patient has an 88% room air saturation in the emergency department.  No home O2 requirement.  Patient is able to speak in 2-3 word sentences, mild to moderate tachypnea.  No significant distress.  Past Medical History:  Diagnosis Date  . Arthritis   . Chicken pox   . COPD (chronic obstructive pulmonary disease) ([place])   . Pulmonary emboli ([place])    on Xarelto    Patient Active Problem List   Diagnosis Date Noted  . Hyperlipidemia with low HDL 02/02/2017  . Insomnia 02/02/2017  . Vitamin D deficiency 02/02/2017  . S/P insertion of IVC (inferior vena caval) filter 02/02/2017  . Swelling of limb 08/27/2016  . 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 Personal history of tobacco use, presenting hazards to health 05/21/2016  . Orthostasis 02/29/2016  . Tobacco use disorder, mild, in sustained remission, abuse 02/16/2016  . Lower extremity venous stasis 06/19/2014  . Recurrent pulmonary embolism ([place]) 11/24/2012  . Solitary pulmonary nodule 11/24/2012  . Pain in left shoulder 11/03/2012   . Pain in right knee 11/03/2012  . COPD (chronic obstructive pulmonary disease) with emphysema ([place]) 11/03/2012  . Colon cancer screening 11/03/2012  . Prostate cancer screening 11/03/2012  . Routine general medical examination at a health care facility 11/03/2012    Past Surgical History:  Procedure Laterality Date  . CHOLECYSTECTOMY  2008  . TONSILLECTOMY AND ADENOIDECTOMY  1947    Prior to Admission medications   Medication Sig Start Date End Date Taking? Authorizing Provider  albuterol (VENTOLIN HFA) 108 (90 Base) MCG/ACT inhaler Inhale 2 puffs into the lungs every 6 (six) hours as needed for wheezing. Reported on 02/28/2016 Patient not taking: Reported on 08/04/2017 02/28/16   [name], MD  apixaban (ELIQUIS) 5 MG TABS tablet Take 1 tablet (5 mg total) by mouth 2 (two) times daily. 01/31/17   [name], MD  Ipratropium-Albuterol (COMBIVENT) 20-100 MCG/ACT AERS respimat Inhale 1 puff into the lungs every 6 (six) hours. Patient not taking: Reported on 08/04/2017 01/31/17   [name], MD  simvastatin (ZOCOR) 20 MG tablet TAKE 1 TABLET BY MOUTH AT BEDTIME 08/04/17   [name], MD  umeclidinium bromide (INCRUSE ELLIPTA) 62.5 MCG/INH AEPB Inhale 1 puff into the lungs daily. 08/29/16   Juanito Doom, MD  vitamin B-12 (CYANOCOBALAMIN) 1000 MCG tablet Take 1,000 mcg by mouth daily. Pt takes 2 tabs daily.    [provider]  VITAMIN D, CHOLECALCIFEROL, PO Take 5,000 Units by mouth daily.    [provider]  No Known Allergies  Family History  Problem Relation Age of Onset  . Arthritis Mother   . Lung cancer Brother        was a smoker  . Breast cancer Maternal Grandmother   . Heart disease Maternal Grandfather   . Asthma Father   . Emphysema Father   . Heart disease Paternal Grandfather     Social History Social History   Tobacco Use  . Smoking status: Former Smoker    Packs/day: 1.00    Years: 59.00    Pack years: 59.00     Types: Cigarettes    Last attempt to quit: 10/12/2012    Years since quitting: 4.9  . Smokeless tobacco: Never Used  Substance Use Topics  . Alcohol use: Yes    Alcohol/week: 1.0 oz    Types: 2 Standard drinks or equivalent per week  . Drug use: No    Review of Systems Constitutional: Negative for fever. Eyes: Negative for visual changes. ENT: Mild congestion Cardiovascular: Negative for chest pain. Respiratory: Shortness of breath progressively worse times 3 days.  Cough without sputum production Gastrointestinal: Negative for abdominal pain, vomiting and diarrhea. Genitourinary: Negative for dysuria. Musculoskeletal: Negative for leg pain or swelling Skin: Negative for rash. Neurological: Negative for headache All other ROS negative  ____________________________________________   PHYSICAL EXAM:  Constitutional: Alert and oriented.  Overall well-appearing but is sitting upright in bed talking in 3-4 word sentences.  Mild respiratory distress. Eyes: Normal exam ENT   Head: Normocephalic and atraumatic   Mouth/Throat: Mucous membranes are moist. Cardiovascular: Regular rhythm, rate around 100 bpm. Respiratory: Mild to moderate tachypnea, mild rhonchi in the left lung fields with expiratory wheeze in the right lung fields. Gastrointestinal: Soft and nontender. No distention.   Musculoskeletal: Nontender with normal range of motion in all extremities. No lower extremity tenderness or edema. Neurologic:  Normal speech and language. No gross focal neurologic deficits Skin:  Skin is warm, dry and intact.  Psychiatric: Mood and affect are normal.   ____________________________________________    EKG  EKG reviewed and interpreted by myself shows sinus rhythm at 92 bpm with a narrow QRS, normal axis, prolonged PR interval otherwise largely normal intervals, nonspecific ST changes.  No ST elevation.  ____________________________________________    RADIOLOGY  Chest  x-ray shows left lower lobe streaky opacities possible pneumonia  ____________________________________________   INITIAL IMPRESSION / ASSESSMENT AND PLAN / ED COURSE  Pertinent labs & imaging results that were available during my care of the patient were reviewed by me and considered in my medical decision making (see chart for details).  Patient presents to the emergency department via EMS for difficulty breathing.  Differential would include COPD exacerbation, pulmonary embolism, ACS, pneumonia, pneumothorax, URI.  We will check labs, chest x-ray, dose an additional DuoNeb and continue to closely monitor in the emergency department.  Given the patient's history of multiple pulmonary embolisms in the past we will likely proceed with a CT angiography of the patient's renal function can tolerate further evaluate.  Patient's history however is more suggestive of COPD exacerbation given progressive worsening over the past 3 days.  Patient has already received 2 DuoNeb's by EMS as well as 125 mg of Solu-Medrol.  The patient states he feels like he can breathe much better now than earlier today.  X-ray consistent with left lower lobe/lingula pneumonia.  Patient maintains a room air saturation in the 80s, on 4 L of oxygen he is maintaining around 92% currently.  We  will admit to the hospital for hypoxia and pneumonia.  ____________________________________________   FINAL CLINICAL IMPRESSION(S) / ED DIAGNOSES  Dyspnea COPD exacerbation Pneumonia   Harvest Dark, MD 09/19/17 2053

## 2017-09-19 NOTE — H&P (Signed)
[place] [facility] [place] NAME: Seth Perez    MR#:  [id number]  DATE OF BIRTH:  1941/09/19  DATE OF ADMISSION:  09/19/2017  PRIMARY CARE PHYSICIAN: Seth Mc, MD   REQUESTING/REFERRING PHYSICIAN: Kerman Passey, MD  CHIEF COMPLAINT:   Chief Complaint  Patient presents with  . Shortness of Breath    HISTORY OF PRESENT ILLNESS:  Seth Perez  is a 76 y.o. male who presents with numbness of breath and cough.  Patient was found here to have pneumonia on x-ray imaging.  Patient also has a history of COPD and had significant wheezing consistent with COPD exacerbation.  Hospitalist were called for admission.  PAST MEDICAL HISTORY:   Past Medical History:  Diagnosis Date  . Arthritis   . Chicken pox   . COPD (chronic obstructive pulmonary disease) ([place])   . Pulmonary emboli ([place])    on Xarelto    PAST SURGICAL HISTORY:   Past Surgical History:  Procedure Laterality Date  . CHOLECYSTECTOMY  2008  . TONSILLECTOMY AND ADENOIDECTOMY  1947    SOCIAL HISTORY:   Social History   Tobacco Use  . Smoking status: Former Smoker    Packs/day: 1.00    Years: 59.00    Pack years: 59.00    Types: Cigarettes    Last attempt to quit: 10/12/2012    Years since quitting: 4.9  . Smokeless tobacco: Never Used  Substance Use Topics  . Alcohol use: Yes    Alcohol/week: 1.0 oz    Types: 2 Standard drinks or equivalent per week    FAMILY HISTORY:   Family History  Problem Relation Age of Onset  . Arthritis Mother   . Lung cancer Brother        was a smoker  . Breast cancer Maternal Grandmother   . Heart disease Maternal Grandfather   . Asthma Father   . Emphysema Father   . Heart disease Paternal Grandfather     DRUG ALLERGIES:  No Known Allergies  MEDICATIONS AT HOME:   Prior to Admission medications   Medication Sig Start Date End Date Taking? Authorizing Provider  apixaban (ELIQUIS) 5 MG TABS tablet Take 1 tablet  (5 mg total) by mouth 2 (two) times daily. 01/31/17  Yes Seth Mc, MD  simvastatin (ZOCOR) 20 MG tablet Take 20 mg by mouth daily.   Yes [provider]  umeclidinium bromide (INCRUSE ELLIPTA) 62.5 MCG/INH AEPB Inhale 1 puff into the lungs daily. 08/29/16  Yes Juanito Doom, MD  vitamin B-12 (CYANOCOBALAMIN) 1000 MCG tablet Take 1,000 mcg by mouth daily. Pt takes 2 tabs daily.   Yes [provider]  VITAMIN D, CHOLECALCIFEROL, PO Take 5,000 Units by mouth daily.   Yes [provider]  albuterol (VENTOLIN HFA) 108 (90 Base) MCG/ACT inhaler Inhale 2 puffs into the lungs every 6 (six) hours as needed for wheezing. Reported on 02/28/2016 Patient not taking: Reported on 08/04/2017 02/28/16   Seth Mc, MD  Ipratropium-Albuterol (COMBIVENT) 20-100 MCG/ACT AERS respimat Inhale 1 puff into the lungs every 6 (six) hours. Patient not taking: Reported on 08/04/2017 01/31/17   Seth Mc, MD  simvastatin (ZOCOR) 20 MG tablet TAKE 1 TABLET BY MOUTH AT BEDTIME Patient not taking: Reported on 09/19/2017 08/04/17   Seth Mc, MD    REVIEW OF SYSTEMS:  Review of Systems  Constitutional: Negative for chills, fever, malaise/fatigue and weight loss.  HENT: Negative for ear pain, hearing  loss and tinnitus.   Eyes: Negative for blurred vision, double vision, pain and redness.  Respiratory: Positive for cough, shortness of breath and wheezing. Negative for hemoptysis.   Cardiovascular: Negative for chest pain, palpitations, orthopnea and leg swelling.  Gastrointestinal: Negative for abdominal pain, constipation, diarrhea, nausea and vomiting.  Genitourinary: Negative for dysuria, frequency and hematuria.  Musculoskeletal: Negative for back pain, joint pain and neck pain.  Skin:       No acne, rash, or lesions  Neurological: Negative for dizziness, tremors, focal weakness and weakness.  Endo/Heme/Allergies: Negative for polydipsia. Does not bruise/bleed easily.   Psychiatric/Behavioral: Negative for depression. The patient is not nervous/anxious and does not have insomnia.      VITAL SIGNS:   Vitals:   09/19/17 1944 09/19/17 1945  BP: 127/76   Pulse: 97   Resp: 20   Temp: 99.2 F (37.3 C)   TempSrc: Oral   SpO2: 94%   Weight:  107 kg (236 lb)  Height:  6\' 3"  (1.905 m)   Wt Readings from Last 3 Encounters:  09/19/17 107 kg (236 lb)  08/29/17 107 kg (236 lb)  08/04/17 110.1 kg (242 lb 12.8 oz)    PHYSICAL EXAMINATION:  Physical Exam  Vitals reviewed. Constitutional: He is oriented to person, place, and time. He appears well-developed and well-nourished. No distress.  HENT:  Head: Normocephalic and atraumatic.  Mouth/Throat: Oropharynx is clear and moist.  Eyes: Conjunctivae and EOM are normal. Pupils are equal, round, and reactive to light. No scleral icterus.  Neck: Normal range of motion. Neck supple. No JVD present. No thyromegaly present.  Cardiovascular: Normal rate, regular rhythm and intact distal pulses. Exam reveals no gallop and no friction rub.  No murmur heard. Respiratory: Effort normal. No respiratory distress. He has wheezes. He has no rales.  Rhonchi  GI: Soft. Bowel sounds are normal. He exhibits no distension. There is no tenderness.  Musculoskeletal: Normal range of motion. He exhibits no edema.  No arthritis, no gout  Lymphadenopathy:    He has no cervical adenopathy.  Neurological: He is alert and oriented to person, place, and time. No cranial nerve deficit.  No dysarthria, no aphasia  Skin: Skin is warm and dry. No rash noted. No erythema.  Psychiatric: He has a normal mood and affect. His behavior is normal. Judgment and thought content normal.    LABORATORY PANEL:   CBC Recent Labs  Lab 09/19/17 1941  WBC 14.8*  HGB 15.0  HCT 44.0  PLT 152   ------------------------------------------------------------------------------------------------------------------  Chemistries  Recent Labs  Lab  09/19/17 1941  NA 137  K 3.6  CL 103  CO2 24  GLUCOSE 108*  BUN 11  CREATININE 0.87  CALCIUM 8.5*  AST 20  ALT 18  ALKPHOS 67  BILITOT 1.6*   ------------------------------------------------------------------------------------------------------------------  Cardiac Enzymes Recent Labs  Lab 09/19/17 1941  TROPONINI <0.03   ------------------------------------------------------------------------------------------------------------------  RADIOLOGY:  Dg Chest 2 View  Result Date: 09/19/2017 CLINICAL DATA:  Shortness of breath EXAM: CHEST  2 VIEW COMPARISON:  05/22/2017, 01/26/2014 FINDINGS: Streaky infiltrate at the lingula and left lung base. No large effusion. Normal heart size. Aortic atherosclerosis. No pneumothorax. IMPRESSION: Streaky infiltrates at the lingula and left lung base. Electronically Signed   By: Donavan Foil M.D.   On: 09/19/2017 20:10    EKG:   Orders placed or performed during the hospital encounter of 09/19/17  . EKG 12-Lead  . EKG 12-Lead    IMPRESSION AND PLAN:  Principal Problem:  CAP (community acquired pneumonia) -IV antibiotics, PRN supportive medications Active Problems:   COPD with acute exacerbation (HCC) -IV Solu-Medrol, duo nebs, antibiotics as above   History of pulmonary embolism -patient has a history of recurrent PE, continue home dose anticoagulation   Hyperlipidemia with low HDL -home dose statin  All the records are reviewed and case discussed with ED provider. Management plans discussed with the patient and/or family.  DVT PROPHYLAXIS: Systemic anticoagulation  GI PROPHYLAXIS: None  ADMISSION STATUS: Inpatient  CODE STATUS: Full Code Status History    This patient does not have a recorded code status. Please follow your organizational policy for patients in this situation.      TOTAL TIME TAKING CARE OF THIS PATIENT: 45 minutes.   Hortensia Duffin FIELDING 09/19/2017, 9:18 PM  [facility]  Office   [phone]  CC: Primary care physician; Seth Mc, MD  Note:  This document was prepared using Dragon voice recognition software and may include unintentional dictation errors.

## 2017-09-19 NOTE — ED Triage Notes (Signed)
PT arrived via ems from home with complaints of shortness of  Breath x 3 days. Pt was put on 2L of oxygen by fire department after finding a 88% oxygen saturation reading. Pt given 125 of solu medrol & 2 duo nebs in route. Pt alert and oriented x 4 upon arrival. Wheezing and rhonchi heard upon ascultation. Pt's room air saturation remains 88%, pt placed on 2L.

## 2017-09-19 NOTE — ED Notes (Signed)
Patient transported to X-ray 

## 2017-09-19 NOTE — Progress Notes (Signed)
Pharmacy Antibiotic Note  Trayvond Viets. is a 76 y.o. male admitted on 09/19/2017 with pneumonia.  Pharmacy has been consulted for ceftriaxone dosing.  Plan: Ceftriaxone 1 gram q 24 hours ordered.  Height: 6\' 3"  (190.5 cm) Weight: 236 lb (107 kg) IBW/kg (Calculated) : 84.5  Temp (24hrs), Avg:99.2 F (37.3 C), Min:99.2 F (37.3 C), Max:99.2 F (37.3 C)  Recent Labs  Lab 09/19/17 1941  WBC 14.8*  CREATININE 0.87    Estimated Creatinine Clearance: 94 mL/min (by C-G formula based on SCr of 0.87 mg/dL).    No Known Allergies  Antimicrobials this admission: Levaquin x1; ceftriaxone 12/28, azithromycin 12/29  >>    >>   Dose adjustments this admission:   Microbiology results: 12/28 BCx: pending      12/28 CXR: Streaky infiltrates at the lingula and left lung base.  Thank you for allowing pharmacy to be a part of this patient's care.  Carol Theys S 09/19/2017 10:20 PM

## 2017-09-20 LAB — BASIC METABOLIC PANEL
Anion gap: 7 (ref 5–15)
BUN: 13 mg/dL (ref 6–20)
CHLORIDE: 105 mmol/L (ref 101–111)
CO2: 26 mmol/L (ref 22–32)
Calcium: 8.6 mg/dL — ABNORMAL LOW (ref 8.9–10.3)
Creatinine, Ser: 0.91 mg/dL (ref 0.61–1.24)
GFR calc Af Amer: >60 mL/min (ref >60)
GFR calc non Af Amer: >60 mL/min (ref >60)
Glucose, Bld: 265 mg/dL — ABNORMAL HIGH (ref 65–99)
POTASSIUM: 3.7 mmol/L (ref 3.5–5.1)
SODIUM: 138 mmol/L (ref 135–145)

## 2017-09-20 LAB — CBC
HEMATOCRIT: 42.6 % (ref 40.0–52.0)
Hemoglobin: 14.5 g/dL (ref 13.0–18.0)
MCH: 30.6 pg (ref 26.0–34.0)
MCHC: 34 g/dL (ref 32.0–36.0)
MCV: 90.1 fL (ref 80.0–100.0)
Platelets: 151 10*3/uL (ref 150–440)
RBC: 4.72 MIL/uL (ref 4.40–5.90)
RDW: 13 % (ref 11.5–14.5)
WBC: 10.3 10*3/uL (ref 3.8–10.6)

## 2017-09-20 MED ORDER — METHYLPREDNISOLONE SODIUM SUCC 125 MG IJ SOLR
60.0000 mg | Freq: Two times a day (BID) | INTRAMUSCULAR | Status: DC
  Administered 2017-09-20 – 2017-09-21 (×2): 60 mg via INTRAVENOUS
  Filled 2017-09-20 (×2): qty 2

## 2017-09-20 MED ORDER — IPRATROPIUM-ALBUTEROL 0.5-2.5 (3) MG/3ML IN SOLN
3.0000 mL | Freq: Four times a day (QID) | RESPIRATORY_TRACT | Status: DC
  Administered 2017-09-20 – 2017-09-21 (×6): 3 mL via RESPIRATORY_TRACT
  Filled 2017-09-20 (×6): qty 3

## 2017-09-20 MED ORDER — BUDESONIDE 0.5 MG/2ML IN SUSP
0.5000 mg | Freq: Two times a day (BID) | RESPIRATORY_TRACT | Status: DC
  Administered 2017-09-20 – 2017-09-23 (×6): 0.5 mg via RESPIRATORY_TRACT
  Filled 2017-09-20 (×6): qty 2

## 2017-09-20 NOTE — Progress Notes (Signed)
Patient ID: Seth ATKISON Sr., male   DOB: Jan 24, 1941, 76 y.o.   MRN: [id number]  [name]:[id number] DOB: 1941/06/30 DOA: 09/19/2017 PCP: Seth Mc, MD  HPI/Subjective: Patient with cough and audible wheeze.  Son who is a Airline pilot and EMS brought him in his pulse ox was 86% and he sounded awful and brought him into the hospital.  Patient feels okay.  He states he has oxygen at home but does not wear it.  Objective: Vitals:   09/20/17 0726 09/20/17 0731  BP:    Pulse:    Resp:    Temp:    SpO2: (!) 87% 91%    Filed Weights   09/19/17 1945  Weight: 107 kg (236 lb)    ROS: Review of Systems  Constitutional: Negative for chills and fever.  Eyes: Negative for blurred vision.  Respiratory: Positive for cough, shortness of breath and wheezing.   Cardiovascular: Negative for chest pain.  Gastrointestinal: Negative for abdominal pain, constipation, diarrhea, nausea and vomiting.  Genitourinary: Negative for dysuria.  Musculoskeletal: Negative for joint pain.  Neurological: Negative for dizziness and headaches.   Exam: Physical Exam  Constitutional: He is oriented to person, place, and time.  HENT:  Nose: No mucosal edema.  Mouth/Throat: No oropharyngeal exudate or posterior oropharyngeal edema.  Eyes: Conjunctivae, EOM and lids are normal. Pupils are equal, round, and reactive to light.  Neck: No JVD present. Carotid bruit is not present. No edema present. No thyroid mass and no thyromegaly present.  Cardiovascular: S1 normal and S2 normal. Exam reveals no gallop.  No murmur heard. Pulses:      Dorsalis pedis pulses are 2+ on the right side, and 2+ on the left side.  Respiratory: No respiratory distress. He has decreased breath sounds in the right middle field, the right lower field, the left middle field and the left lower field. He has wheezes in the right middle field, the right lower field, the left middle field and  the left lower field. He has no rhonchi. He has no rales.  GI: Soft. Bowel sounds are normal. There is no tenderness.  Musculoskeletal:       Right ankle: He exhibits no swelling.       Left ankle: He exhibits no swelling.  Lymphadenopathy:    He has no cervical adenopathy.  Neurological: He is alert and oriented to person, place, and time. No cranial nerve deficit.  Skin: Skin is warm. No rash noted. Nails show no clubbing.  Psychiatric: He has a normal mood and affect.      Data Reviewed: Basic Metabolic Panel: Recent Labs  Lab 09/19/17 1941 09/20/17 0409  NA 137 138  K 3.6 3.7  CL 103 105  CO2 24 26  GLUCOSE 108* 265*  BUN 11 13  CREATININE 0.87 0.91  CALCIUM 8.5* 8.6*   Liver Function Tests: Recent Labs  Lab 09/19/17 1941  AST 20  ALT 18  ALKPHOS 67  BILITOT 1.6*  PROT 7.3  ALBUMIN 4.1   CBC: Recent Labs  Lab 09/19/17 1941 09/20/17 0409  WBC 14.8* 10.3  HGB 15.0 14.5  HCT 44.0 42.6  MCV 89.5 90.1  PLT 152 151   Cardiac Enzymes: Recent Labs  Lab 09/19/17 1941  TROPONINI <0.03   BNP (last 3 results) Recent Labs    09/19/17 1941  BNP 33.0     Recent Results (from the past 240 hour(s))  Blood culture (routine x 2)  Status: None (Preliminary result)   Collection Time: 09/19/17  8:49 PM  Result Value Ref Range Status   Specimen Description BLOOD LEFT ANTECUBITAL  Final   Special Requests   Final    BOTTLES DRAWN AEROBIC AND ANAEROBIC Blood Culture adequate volume   Culture   Final    NO GROWTH < 24 HOURS Performed at [facility], [address], [place], [place] [address]    Report Status PENDING  Incomplete  Blood culture (routine x 2)     Status: None (Preliminary result)   Collection Time: 09/19/17  8:49 PM  Result Value Ref Range Status   Specimen Description BLOOD RIGHT WRIST  Final   Special Requests   Final    BOTTLES DRAWN AEROBIC AND ANAEROBIC Blood Culture adequate volume   Culture   Final    NO GROWTH < 24  HOURS Performed at [facility], [address] [place], [place] [address]    Report Status PENDING  Incomplete     Studies: Dg Chest 2 View  Result Date: 09/19/2017 CLINICAL DATA:  Shortness of breath EXAM: CHEST  2 VIEW COMPARISON:  05/22/2017, 01/26/2014 FINDINGS: Streaky infiltrate at the lingula and left lung base. No large effusion. Normal heart size. Aortic atherosclerosis. No pneumothorax. IMPRESSION: Streaky infiltrates at the lingula and left lung base. Electronically Signed   By: Donavan Foil M.D.   On: 09/19/2017 20:10    Scheduled Meds: . apixaban  5 mg Oral BID  . budesonide (PULMICORT) nebulizer solution  0.5 mg Nebulization BID  . ipratropium-albuterol  3 mL Nebulization Q6H  . methylPREDNISolone (SOLU-MEDROL) injection  60 mg Intravenous Q12H  . simvastatin  20 mg Oral Daily  . umeclidinium bromide  1 puff Inhalation Daily   Continuous Infusions: . azithromycin    . cefTRIAXone (ROCEPHIN) IVPB 1 gram/50 mL D5W Stopped (09/19/17 2332)    Assessment/Plan:  1. Acute hypoxic respiratory failure.  Pulse ox 86% on room air.  Continue oxygen supplementation 2. COPD exacerbation.  Continue IV Solu-Medrol, DuoNeb nebulizer solution and add budesonide nebulizers. 3. Pneumonia left lower lobe on Rocephin and Zithromax 4. History of pulmonary emboli on Eliquis 5. Hyperlipidemia unspecified on simvastatin  Code Status:     Code Status Orders  (From admission, onward)        Start     Ordered   09/19/17 2215  Full code  Continuous     09/19/17 2214    Code Status History    Date Active Date Inactive Code Status Order ID Comments User Context   This patient has a current code status but no historical code status.    Advance Directive Documentation     Most Recent Value  Type of Advance Directive  Healthcare Power of Attorney  Pre-existing out of facility DNR order (yellow form or pink MOST form)  No data  "MOST" Form in Place?  No data     Family  Communication: Son at the bedside Disposition Plan: Once off oxygen and breathing better can go home  Antibiotics:  Rocephin  Zithromax  Time spent: 28 minutes  [place]

## 2017-09-20 NOTE — Plan of Care (Signed)
  Clinical Measurements: Respiratory complications will improve 09/20/2017 1431 - Progressing by Oris Drone, RN  Pt's O2 weaned from 4L to 2L ; pt continues to remove O2 when OOB with sats dropping to 87%; pt verbalized that he has home oxygen, but he has not had to wear it d/t his O2 sats remaining > 96% on RA Safety: Ability to remain free from injury will improve 09/20/2017 1431 - Progressing by Oris Drone, RN  Pt remains on Moderate Fall Risks; independent when oob; ambulated in the hallway this shift Clinical Measurements: Will remain free from infection 09/20/2017 1431 - Not Met (add Reason) by Oris Drone, RN  Pt admitted on 09/19/17 with community acquired pneumonia

## 2017-09-21 LAB — GLUCOSE, CAPILLARY
Glucose-Capillary: 226 mg/dL — ABNORMAL HIGH (ref 65–99)
Glucose-Capillary: 169 mg/dL — ABNORMAL HIGH (ref 65–99)

## 2017-09-21 MED ORDER — AZITHROMYCIN 500 MG PO TABS
500.0000 mg | ORAL_TABLET | Freq: Every day | ORAL | Status: DC
  Administered 2017-09-21 – 2017-09-22 (×2): 500 mg via ORAL
  Filled 2017-09-21 (×2): qty 1

## 2017-09-21 MED ORDER — INSULIN ASPART 100 UNIT/ML ~~LOC~~ SOLN
0.0000 [IU] | Freq: Three times a day (TID) | SUBCUTANEOUS | Status: DC
  Administered 2017-09-21: 18:00:00 3 [IU] via SUBCUTANEOUS
  Filled 2017-09-21: qty 1

## 2017-09-21 MED ORDER — INSULIN ASPART 100 UNIT/ML ~~LOC~~ SOLN
0.0000 [IU] | Freq: Every day | SUBCUTANEOUS | Status: DC
Start: 2017-09-21 — End: 2017-09-22

## 2017-09-21 MED ORDER — METHYLPREDNISOLONE SODIUM SUCC 40 MG IJ SOLR
40.0000 mg | Freq: Every day | INTRAMUSCULAR | Status: DC
  Administered 2017-09-22 – 2017-09-23 (×2): 40 mg via INTRAVENOUS
  Filled 2017-09-21 (×2): qty 1

## 2017-09-21 NOTE — Progress Notes (Signed)
PHARMACIST - PHYSICIAN COMMUNICATION DR:   Leslye Peer CONCERNING: Antibiotic IV to Oral Route Change Policy  RECOMMENDATION: This patient is receiving azithroymcin by the intravenous route.  Based on criteria approved by the Pharmacy and Therapeutics Committee, the antibiotic(s) is/are being converted to the equivalent oral dose form(s).   DESCRIPTION: These criteria include:  Patient being treated for a respiratory tract infection, urinary tract infection, cellulitis or clostridium difficile associated diarrhea if on metronidazole  The patient is not neutropenic and does not exhibit a GI malabsorption state  The patient is eating (either orally or via tube) and/or has been taking other orally administered medications for a least 24 hours  The patient is improving clinically and has a Tmax < 100.5  If you have questions about this conversion, please contact the Pharmacy Department  []   [phone] )  [facility] [x]   [phone] )  [facility] []   [phone] )  [facility] []   [phone] )  [facility] []   [phone] )  [facility]

## 2017-09-21 NOTE — Plan of Care (Signed)
  Clinical Measurements: Respiratory complications will improve 09/21/2017 1550 - Progressing by Oris Drone, RN  Pt continues to wear O2 at 2L [place]; removes when oob; verbalizes that he does this at home Safety: Ability to remain free from injury will improve 09/21/2017 1550 - Progressing by Oris Drone, RN  Remains on Moderate Fall Risks

## 2017-09-21 NOTE — Progress Notes (Signed)
Pt instructed in use of flutter valve to help mobilize secretions

## 2017-09-21 NOTE — Progress Notes (Addendum)
Patient ID: Seth MCSHAN Sr., male   DOB: 1941-05-05, 76 y.o.   MRN: [id number]  Seth Perez. [id number] DOB: 01/28/1941 DOA: 09/19/2017 PCP: [name], MD  HPI/Subjective: Patient with cough and audible wheeze.  Son who is a Airline pilot and EMS brought him in his pulse ox was 86% and he sounded awful and brought him into the hospital.  Patient feels okay.  He states he has oxygen at home but does not wear it.  Objective: Vitals:   09/21/17 0454 09/21/17 0725  BP: 101/62   Pulse: 89   Resp: 20   Temp: 98 F (36.7 C)   SpO2: 90% (!) 87%    Filed Weights   09/19/17 1945  Weight: 107 kg (236 lb)    ROS: Review of Systems  Constitutional: Negative for chills and fever.  Eyes: Negative for blurred vision.  Respiratory: Positive for cough, shortness of breath and wheezing.   Cardiovascular: Negative for chest pain.  Gastrointestinal: Negative for abdominal pain, constipation, diarrhea, nausea and vomiting.  Genitourinary: Negative for dysuria.  Musculoskeletal: Negative for joint pain.  Neurological: Negative for dizziness and headaches.   Exam: Physical Exam  Constitutional: He is oriented to person, place, and time.  HENT:  Nose: No mucosal edema.  Mouth/Throat: No oropharyngeal exudate or posterior oropharyngeal edema.  Eyes: Conjunctivae, EOM and lids are normal. Pupils are equal, round, and reactive to light.  Neck: No JVD present. Carotid bruit is not present. No edema present. No thyroid mass and no thyromegaly present.  Cardiovascular: S1 normal and S2 normal. Exam reveals no gallop.  No murmur heard. Pulses:      Dorsalis pedis pulses are 2+ on the right side, and 2+ on the left side.  Respiratory: No respiratory distress. He has decreased breath sounds in the right middle field, the right lower field, the left middle field and the left lower field. He has wheezes in the right middle field, the right lower field,  the left middle field and the left lower field. He has no rhonchi. He has no rales.  GI: Soft. Bowel sounds are normal. There is no tenderness.  Musculoskeletal:       Right ankle: He exhibits no swelling.       Left ankle: He exhibits no swelling.  Lymphadenopathy:    He has no cervical adenopathy.  Neurological: He is alert and oriented to person, place, and time. No cranial nerve deficit.  Skin: Skin is warm. No rash noted. Nails show no clubbing.  Psychiatric: He has a normal mood and affect.      Data Reviewed: Basic Metabolic Panel: Recent Labs  Lab 09/19/17 1941 09/20/17 0409  NA 137 138  K 3.6 3.7  CL 103 105  CO2 24 26  GLUCOSE 108* 265*  BUN 11 13  CREATININE 0.87 0.91  CALCIUM 8.5* 8.6*   Liver Function Tests: Recent Labs  Lab 09/19/17 1941  AST 20  ALT 18  ALKPHOS 67  BILITOT 1.6*  PROT 7.3  ALBUMIN 4.1   CBC: Recent Labs  Lab 09/19/17 1941 09/20/17 0409  WBC 14.8* 10.3  HGB 15.0 14.5  HCT 44.0 42.6  MCV 89.5 90.1  PLT 152 151   Cardiac Enzymes: Recent Labs  Lab 09/19/17 1941  TROPONINI <0.03   BNP (last 3 results) Recent Labs    09/19/17 1941  BNP 33.0     Recent Results (from the past 240 hour(s))  Blood culture (  routine x 2)     Status: None (Preliminary result)   Collection Time: 09/19/17  8:49 PM  Result Value Ref Range Status   Specimen Description BLOOD LEFT ANTECUBITAL  Final   Special Requests   Final    BOTTLES DRAWN AEROBIC AND ANAEROBIC Blood Culture adequate volume   Culture   Final    NO GROWTH 2 DAYS Performed at [facility], [address], [place], [place] [address]    Report Status PENDING  Incomplete  Blood culture (routine x 2)     Status: None (Preliminary result)   Collection Time: 09/19/17  8:49 PM  Result Value Ref Range Status   Specimen Description BLOOD RIGHT WRIST  Final   Special Requests   Final    BOTTLES DRAWN AEROBIC AND ANAEROBIC Blood Culture adequate volume   Culture   Final     NO GROWTH 2 DAYS Performed at [facility], [address] [place], [place] [address]    Report Status PENDING  Incomplete     Studies: Dg Chest 2 View  Result Date: 09/19/2017 CLINICAL DATA:  Shortness of breath EXAM: CHEST  2 VIEW COMPARISON:  05/22/2017, 01/26/2014 FINDINGS: Streaky infiltrate at the lingula and left lung base. No large effusion. Normal heart size. Aortic atherosclerosis. No pneumothorax. IMPRESSION: Streaky infiltrates at the lingula and left lung base. Electronically Signed   By: Donavan Foil M.D.   On: 09/19/2017 20:10    Scheduled Meds: . apixaban  5 mg Oral BID  . azithromycin  500 mg Oral q1800  . budesonide (PULMICORT) nebulizer solution  0.5 mg Nebulization BID  . ipratropium-albuterol  3 mL Nebulization Q6H  . methylPREDNISolone (SOLU-MEDROL) injection  60 mg Intravenous Q12H  . simvastatin  20 mg Oral Daily  . umeclidinium bromide  1 puff Inhalation Daily   Continuous Infusions: . cefTRIAXone (ROCEPHIN) IVPB 1 gram/50 mL D5W Stopped (09/20/17 2338)    Assessment/Plan:  1. Acute hypoxic respiratory failure.  Pulse ox 87% on room air.  Continue oxygen supplementation.  Would like to get off oxygen prior to disposition. 2. COPD exacerbation.  Continue IV Solu-Medrol, DuoNeb nebulizer solution and budesonide nebulizers. 3. Pneumonia left lower lobe on Rocephin and Zithromax 4. History of pulmonary emboli on Eliquis 5. Hyperlipidemia unspecified on simvastatin 6. Saw a sugar of 265.  Will check a hemoglobin A1c and put on sliding scale and try to decrease Solu-Medrol to 40 mg daily.  Code Status:     Code Status Orders  (From admission, onward)        Start     Ordered   09/19/17 2215  Full code  Continuous     09/19/17 2214    Code Status History    Date Active Date Inactive Code Status Order ID Comments User Context   This patient has a current code status but no historical code status.    Advance Directive Documentation      Most Recent Value  Type of Advance Directive  Healthcare Power of Attorney  Pre-existing out of facility DNR order (yellow form or pink MOST form)  No data  "MOST" Form in Place?  No data     Family Communication: Son yesterday Disposition Plan: Once off oxygen and breathing better can go home  Antibiotics:  Rocephin  Zithromax  Time spent: 26 minutes  [place]

## 2017-09-22 LAB — GLUCOSE, CAPILLARY
GLUCOSE-CAPILLARY: 94 mg/dL (ref 65–99)
GLUCOSE-CAPILLARY: 98 mg/dL (ref 65–99)
GLUCOSE-CAPILLARY: 143 mg/dL — AB (ref 65–99)
GLUCOSE-CAPILLARY: 158 mg/dL — AB (ref 65–99)

## 2017-09-22 LAB — BASIC METABOLIC PANEL
Anion gap: 9 (ref 5–15)
BUN: 24 mg/dL — ABNORMAL HIGH (ref 6–20)
CHLORIDE: 105 mmol/L (ref 101–111)
CO2: 27 mmol/L (ref 22–32)
CREATININE: 1.05 mg/dL (ref 0.61–1.24)
Calcium: 8.7 mg/dL — ABNORMAL LOW (ref 8.9–10.3)
GFR calc non Af Amer: >60 mL/min (ref >60)
Glucose, Bld: 114 mg/dL — ABNORMAL HIGH (ref 65–99)
POTASSIUM: 3.8 mmol/L (ref 3.5–5.1)
SODIUM: 141 mmol/L (ref 135–145)

## 2017-09-22 LAB — HEMOGLOBIN A1C
Hgb A1c MFr Bld: 5.5 % (ref 4.8–5.6)
Mean Plasma Glucose: 111.15 mg/dL

## 2017-09-22 MED ORDER — IPRATROPIUM-ALBUTEROL 0.5-2.5 (3) MG/3ML IN SOLN: 3.0000 mL | Freq: Four times a day (QID) | RESPIRATORY_TRACT | Status: DC | PRN

## 2017-09-22 MED ORDER — IPRATROPIUM-ALBUTEROL 0.5-2.5 (3) MG/3ML IN SOLN
3.0000 mL | Freq: Three times a day (TID) | RESPIRATORY_TRACT | Status: DC
  Administered 2017-09-22 – 2017-09-23 (×5): 3 mL via RESPIRATORY_TRACT
  Filled 2017-09-22 (×5): qty 3

## 2017-09-22 NOTE — Progress Notes (Signed)
02 sat at rest with 2L 02: 90 02 sat with exertion with 2L: 90 02 sat with exertion without 02: 86 02 sat at rest without 02: 87

## 2017-09-22 NOTE — Progress Notes (Signed)
Patient ID: Seth TIPPETT Sr., male   DOB: 1941/01/04, 76 y.o.   MRN: [id number]  [name] DOB: [birth year] DOA: 09/19/2017 PCP: [name], MD  HPI/Subjective: Patient with cough and audible wheeze.  Son who is a Airline pilot and EMS brought him in his pulse ox was 86% and he sounded awful and brought him into the hospital.  Patient feels okay.  He states he has oxygen at home but does not wear it.  Objective: Vitals:   09/22/17 0800 09/22/17 0810  BP:    Pulse:    Resp:    Temp:    SpO2: (!) 86% 90%    Filed Weights   09/19/17 1945  Weight: 107 kg (236 lb)    ROS: Review of Systems  Constitutional: Negative for chills and fever.  Eyes: Negative for blurred vision.  Respiratory: Positive for cough, shortness of breath and wheezing.   Cardiovascular: Negative for chest pain.  Gastrointestinal: Negative for abdominal pain, constipation, diarrhea, nausea and vomiting.  Genitourinary: Negative for dysuria.  Musculoskeletal: Negative for joint pain.  Neurological: Negative for dizziness and headaches.   Exam: Physical Exam  Constitutional: He is oriented to person, place, and time.  HENT:  Nose: No mucosal edema.  Mouth/Throat: No oropharyngeal exudate or posterior oropharyngeal edema.  Eyes: Conjunctivae, EOM and lids are normal. Pupils are equal, round, and reactive to light.  Neck: No JVD present. Carotid bruit is not present. No edema present. No thyroid mass and no thyromegaly present.  Cardiovascular: S1 normal and S2 normal. Exam reveals no gallop.  No murmur heard. Pulses:      Dorsalis pedis pulses are 2+ on the right side, and 2+ on the left side.  Respiratory: No respiratory distress. He has decreased breath sounds in the right middle field, the right lower field, the left middle field and the left lower field. He has wheezes in the right middle field, the right lower field, the left middle field and  the left lower field. He has no rhonchi. He has no rales.  GI: Soft. Bowel sounds are normal. There is no tenderness.  Musculoskeletal:       Right ankle: He exhibits no swelling.       Left ankle: He exhibits no swelling.  Lymphadenopathy:    He has no cervical adenopathy.  Neurological: He is alert and oriented to person, place, and time. No cranial nerve deficit.  Skin: Skin is warm. No rash noted. Nails show no clubbing.  Psychiatric: He has a normal mood and affect.      Data Reviewed: Basic Metabolic Panel: Recent Labs  Lab 09/19/17 1941 09/20/17 0409 09/22/17 0421  NA 137 138 141  K 3.6 3.7 3.8  CL 103 105 105  CO2 24 26 27   GLUCOSE 108* 265* 114*  BUN 11 13 24*  CREATININE 0.87 0.91 1.05  CALCIUM 8.5* 8.6* 8.7*   Liver Function Tests: Recent Labs  Lab 09/19/17 1941  AST 20  ALT 18  ALKPHOS 67  BILITOT 1.6*  PROT 7.3  ALBUMIN 4.1   CBC: Recent Labs  Lab 09/19/17 1941 09/20/17 0409  WBC 14.8* 10.3  HGB 15.0 14.5  HCT 44.0 42.6  MCV 89.5 90.1  PLT 152 151   Cardiac Enzymes: Recent Labs  Lab 09/19/17 1941  TROPONINI <0.03   BNP (last 3 results) Recent Labs    09/19/17 1941  BNP 33.0     Recent Results (from  the past 240 hour(s))  Blood culture (routine x 2)     Status: None (Preliminary result)   Collection Time: 09/19/17  8:49 PM  Result Value Ref Range Status   Specimen Description BLOOD LEFT ANTECUBITAL  Final   Special Requests   Final    BOTTLES DRAWN AEROBIC AND ANAEROBIC Blood Culture adequate volume   Culture   Final    NO GROWTH 3 DAYS Performed at [facility], [address] [place], [place] [address]    Report Status PENDING  Incomplete  Blood culture (routine x 2)     Status: None (Preliminary result)   Collection Time: 09/19/17  8:49 PM  Result Value Ref Range Status   Specimen Description BLOOD RIGHT WRIST  Final   Special Requests   Final    BOTTLES DRAWN AEROBIC AND ANAEROBIC Blood Culture adequate  volume   Culture   Final    NO GROWTH 3 DAYS Performed at [facility], [address], [place], [place] [address]    Report Status PENDING  Incomplete     Studies: No results found.  Scheduled Meds: . apixaban  5 mg Oral BID  . azithromycin  500 mg Oral q1800  . budesonide (PULMICORT) nebulizer solution  0.5 mg Nebulization BID  . ipratropium-albuterol  3 mL Nebulization TID  . methylPREDNISolone (SOLU-MEDROL) injection  40 mg Intravenous Daily  . simvastatin  20 mg Oral Daily  . umeclidinium bromide  1 puff Inhalation Daily   Continuous Infusions: . cefTRIAXone (ROCEPHIN) IVPB 1 gram/50 mL D5W Stopped (09/21/17 2239)    Assessment/Plan:  1. Acute hypoxic respiratory failure.  Pulse ox 86% on room air.  Continue oxygen supplementation.  Would like to get off oxygen prior to disposition. 2. COPD exacerbation.  Continue IV Solu-Medrol, DuoNeb nebulizer solution and budesonide nebulizers. 3. Pneumonia left lower lobe on Rocephin and Zithromax 4. History of pulmonary emboli on Eliquis 5. Hyperlipidemia unspecified on simvastatin 6. Impaired fasting glucose.  Patient not a diabetic since hemoglobin A1c 5.5.  Code Status:     Code Status Orders  (From admission, onward)        Start     Ordered   09/19/17 2215  Full code  Continuous     09/19/17 2214    Code Status History    Date Active Date Inactive Code Status Order ID Comments User Context   This patient has a current code status but no historical code status.    Advance Directive Documentation     Most Recent Value  Type of Advance Directive  Healthcare Power of Attorney  Pre-existing out of facility DNR order (yellow form or pink MOST form)  No data  "MOST" Form in Place?  No data     Disposition Plan: Once off oxygen and breathing better can go home  Antibiotics:  Rocephin  Zithromax  Time spent: 25 minutes  [place]

## 2017-09-22 NOTE — Care Management Important Message (Signed)
Important Message  Patient Details  Name: Seth CLECKLER Sr. MRN: [id number] Date of Birth: [birth year]   Medicare Important Message Given:  Yes    Shelbie Ammons, RN 09/22/2017, 7:40 AM

## 2017-09-23 LAB — GLUCOSE, CAPILLARY
Glucose-Capillary: 99 mg/dL (ref 65–99)
Glucose-Capillary: 127 mg/dL — ABNORMAL HIGH (ref 65–99)

## 2017-09-23 MED ORDER — CEFUROXIME AXETIL 500 MG PO TABS: 500.0000 mg | ORAL_TABLET | Freq: Two times a day (BID) | ORAL | 0 refills | Status: AC

## 2017-09-23 MED ORDER — PREDNISONE 20 MG PO TABS: ORAL_TABLET | ORAL | 0 refills | Status: DC

## 2017-09-23 MED ORDER — FLUTICASONE-SALMETEROL 115-21 MCG/ACT IN AERO: 2.0000 | INHALATION_SPRAY | Freq: Two times a day (BID) | RESPIRATORY_TRACT | 0 refills | Status: DC

## 2017-09-23 MED ORDER — AZITHROMYCIN 250 MG PO TABS: ORAL_TABLET | ORAL | 0 refills | Status: DC

## 2017-09-23 NOTE — Progress Notes (Signed)
Discharge instructions along with home medications and follow up gone over with patient and wife. Both verbalize that they understood instructions. 1 prescription given to patient. IV removed. Pt being discharged home on room air, no distress noted. Senay Sistrunk S Fenton, RN 

## 2017-09-23 NOTE — Care Management (Signed)
No discharge needs identified by members of the care team 

## 2017-09-23 NOTE — Discharge Instructions (Signed)
Chronic Obstructive Pulmonary Disease Exacerbation  Chronic obstructive pulmonary disease (COPD) is a common lung problem. In COPD, the flow of air from the lungs is limited. COPD exacerbations are times that breathing gets worse and you need extra treatment. Without treatment they can be life threatening. If they happen often, your lungs can become more damaged. If your COPD gets worse, your doctor may treat you with:  ? Medicines.  ? Oxygen.  ? Different ways to clear your airway, such as using a mask.    Follow these instructions at home:  ? Do not smoke.  ? Avoid tobacco smoke and other things that bother your lungs.  ? If given, take your antibiotic medicine as told. Finish the medicine even if you start to feel better.  ? Only take medicines as told by your doctor.  ? Drink enough fluids to keep your pee (urine) clear or pale yellow (unless your doctor has told you not to).  ? Use a cool mist machine (vaporizer).  ? If you use oxygen or a machine that turns liquid medicine into a mist (nebulizer), continue to use them as told.  ? Keep up with shots (vaccinations) as told by your doctor.  ? Exercise regularly.  ? Eat healthy foods.  ? Keep all doctor visits as told.  Get help right away if:  ? You are very short of breath and it gets worse.  ? You have trouble talking.  ? You have bad chest pain.  ? You have blood in your spit (sputum).  ? You have a fever.  ? You keep throwing up (vomiting).  ? You feel weak, or you pass out (faint).  ? You feel confused.  ? You keep getting worse.  This information is not intended to replace advice given to you by your health care provider. Make sure you discuss any questions you have with your health care provider.  Document Released: 08/29/2011 Document Revised: 02/15/2016 Document Reviewed: 05/14/2013  Elsevier Interactive Patient Education ? 2017 Elsevier Inc.

## 2017-09-23 NOTE — Discharge Summary (Signed)
[place] [facility] [place] NAME: Seth Perez    MR#:  [id number]  DATE OF BIRTH:  12-22-1940  DATE OF ADMISSION:  09/19/2017 ADMITTING PHYSICIAN: Lance Coon, MD  DATE OF DISCHARGE: 09/23/2017  PRIMARY CARE PHYSICIAN: [name], MD    ADMISSION DIAGNOSIS:  Hypoxia [R09.02] COPD exacerbation ([place]) [J44.1] Community acquired pneumonia of left lower lobe of lung ([place]) [J18.1]  DISCHARGE DIAGNOSIS:  Principal Problem:   CAP (community acquired pneumonia) Active Problems:   History of pulmonary embolism   Hyperlipidemia with low HDL   COPD with acute exacerbation ([place])   SECONDARY DIAGNOSIS:   Past Medical History:  Diagnosis Date  . Arthritis   . Chicken pox   . COPD (chronic obstructive pulmonary disease) ([place])   . Pulmonary emboli ([place])    on Xarelto    HOSPITAL COURSE:   1.  Acute hypoxic respiratory failure.  The patient required oxygen during the hospital course.  This afternoon his pulse ox was better with walking around and the patient does not require oxygen.  The patient will go home off oxygen. 2.  COPD exacerbation.  Patient was given IV Solu-Medrol and DuoNeb neb solution and budesonide nebulizers while here.  Upon going home I will give prednisone for another 3 days.  Prescribed Ceftin and Zithromax upon going home.  The patient has no McLin DM bromide and albuterol inhaler at home.  I will prescribe steroid inhaler Advair. 3.  Pneumonia left lower lobe on Rocephin and Zithromax.  Switch over to Ceftin and Zithromax upon going home. 4.  History of pulmonary embolism on Eliquis 5.  Hyperlipidemia unspecified on simvastatin 6.  Impaired fasting glucose.  Patient not a diabetic since hemoglobin A1c 5.5  DISCHARGE CONDITIONS:   Satisfactory  CONSULTS OBTAINED:  None  DRUG ALLERGIES:  No Known Allergies  DISCHARGE MEDICATIONS:   Allergies as of 09/23/2017   No Known Allergies     Medication List    STOP  taking these medications   Ipratropium-Albuterol 20-100 MCG/ACT Aers respimat Commonly known as:  COMBIVENT     TAKE these medications   albuterol 108 (90 Base) MCG/ACT inhaler Commonly known as:  VENTOLIN HFA Inhale 2 puffs into the lungs every 6 (six) hours as needed for wheezing. Reported on 02/28/2016   apixaban 5 MG Tabs tablet Commonly known as:  ELIQUIS Take 1 tablet (5 mg total) by mouth 2 (two) times daily.   azithromycin 250 MG tablet Commonly known as:  ZITHROMAX One tab po daily   cefUROXime 500 MG tablet Commonly known as:  CEFTIN Take 1 tablet (500 mg total) by mouth 2 (two) times daily for 7 days.   fluticasone-salmeterol 115-21 MCG/ACT inhaler Commonly known as:  ADVAIR HFA Inhale 2 puffs into the lungs 2 (two) times daily.   predniSONE 20 MG tablet Commonly known as:  DELTASONE Take two tabs daily for three days   simvastatin 20 MG tablet Commonly known as:  ZOCOR Take 20 mg by mouth daily. What changed:  Another medication with the same name was removed. Continue taking this medication, and follow the directions you see here.   umeclidinium bromide 62.5 MCG/INH Aepb Commonly known as:  INCRUSE ELLIPTA Inhale 1 puff into the lungs daily.   vitamin B-12 1000 MCG tablet Commonly known as:  CYANOCOBALAMIN Take 1,000 mcg by mouth daily. Pt takes 2 tabs daily.   VITAMIN D (CHOLECALCIFEROL) PO Take 5,000 Units by mouth daily.  DISCHARGE INSTRUCTIONS:   Follow-up PMD 1 week  If you experience worsening of your admission symptoms, develop shortness of breath, life threatening emergency, suicidal or homicidal thoughts you must seek medical attention immediately by calling 911 or calling your MD immediately  if symptoms less severe.  You Must read complete instructions/literature along with all the possible adverse reactions/side effects for all the Medicines you take and that have been prescribed to you. Take any new Medicines after you have  completely understood and accept all the possible adverse reactions/side effects.   Please note  You were cared for by a hospitalist during your hospital stay. If you have any questions about your discharge medications or the care you received while you were in the hospital after you are discharged, you can call the unit and asked to speak with the hospitalist on call if the hospitalist that took care of you is not available. Once you are discharged, your primary care physician will handle any further medical issues. Please note that NO REFILLS for any discharge medications will be authorized once you are discharged, as it is imperative that you return to your primary care physician (or establish a relationship with a primary care physician if you do not have one) for your aftercare needs so that they can reassess your need for medications and monitor your lab values.    Today   CHIEF COMPLAINT:   Chief Complaint  Patient presents with  . Shortness of Breath    HISTORY OF PRESENT ILLNESS:  Seth Perez  is a 77 y.o. male with a known history of COPD presented with shortness of breath   VITAL SIGNS:  Blood pressure 114/69, pulse (!) 54, temperature (!) 97.4 F (36.3 C), temperature source Oral, resp. rate 16, height 6\' 3"  (1.905 m), weight 107 kg (236 lb), SpO2 93 %.    PHYSICAL EXAMINATION:  GENERAL:  77 y.o.-year-old patient lying in the bed with no acute distress.  EYES: Pupils equal, round, reactive to light and accommodation. No scleral icterus. Extraocular muscles intact.  HEENT: Head atraumatic, normocephalic. Oropharynx and nasopharynx clear.  NECK:  Supple, no jugular venous distention. No thyroid enlargement, no tenderness.  LUNGS:  decreased breath sounds bilaterally, no wheezing, rales,rhonchi or crepitation. No use of accessory muscles of respiration.  CARDIOVASCULAR: S1, S2 normal. No murmurs, rubs, or gallops.  ABDOMEN: Soft, non-tender, non-distended. Bowel sounds  present. No organomegaly or mass.  EXTREMITIES: No pedal edema, cyanosis, or clubbing.  NEUROLOGIC: Cranial nerves II through XII are intact. Muscle strength 5/5 in all extremities. Sensation intact. Gait not checked.  PSYCHIATRIC: The patient is alert and oriented x 3.  SKIN: No obvious rash, lesion, or ulcer.   DATA REVIEW:   CBC Recent Labs  Lab 09/20/17 0409  WBC 10.3  HGB 14.5  HCT 42.6  PLT 151    Chemistries  Recent Labs  Lab 09/19/17 1941  09/22/17 0421  NA 137   < > 141  K 3.6   < > 3.8  CL 103   < > 105  CO2 24   < > 27  GLUCOSE 108*   < > 114*  BUN 11   < > 24*  CREATININE 0.87   < > 1.05  CALCIUM 8.5*   < > 8.7*  AST 20  --   --   ALT 18  --   --   ALKPHOS 67  --   --   BILITOT 1.6*  --   --    < > =  values in this interval not displayed.    Cardiac Enzymes Recent Labs  Lab 09/19/17 1941  TROPONINI <0.03    Microbiology Results  Results for orders placed or performed during the hospital encounter of 09/19/17  Blood culture (routine x 2)     Status: None (Preliminary result)   Collection Time: 09/19/17  8:49 PM  Result Value Ref Range Status   Specimen Description BLOOD LEFT ANTECUBITAL  Final   Special Requests   Final    BOTTLES DRAWN AEROBIC AND ANAEROBIC Blood Culture adequate volume   Culture   Final    NO GROWTH 4 DAYS Performed at [facility], [address] [place], [place] [address]    Report Status PENDING  Incomplete  Blood culture (routine x 2)     Status: None (Preliminary result)   Collection Time: 09/19/17  8:49 PM  Result Value Ref Range Status   Specimen Description BLOOD RIGHT WRIST  Final   Special Requests   Final    BOTTLES DRAWN AEROBIC AND ANAEROBIC Blood Culture adequate volume   Culture   Final    NO GROWTH 4 DAYS Performed at [facility], [address], [place], [place] [address]    Report Status PENDING  Incomplete       Management plans discussed with the patient, family and they  are in agreement.  CODE STATUS:     Code Status Orders  (From admission, onward)        Start     Ordered   09/19/17 2215  Full code  Continuous     09/19/17 2214    Code Status History    Date Active Date Inactive Code Status Order ID Comments User Context   This patient has a current code status but no historical code status.    Advance Directive Documentation     Most Recent Value  Type of Advance Directive  Healthcare Power of Attorney  Pre-existing out of facility DNR order (yellow form or pink MOST form)  No data  "MOST" Form in Place?  No data      TOTAL TIME TAKING CARE OF THIS PATIENT: 36 minutes.    Loletha Grayer M.D on 09/23/2017 at 2:23 PM  Between 7am to 6pm - Pager - [phone]  After 6pm go to [email or web address] - password [email or web address]  [employer] Office  [phone]  CC: Primary care physician; [name], MD

## 2017-09-23 NOTE — Progress Notes (Signed)
Patient ID: Seth LONGSWORTH Sr., male   DOB: October 03, 1940, 77 y.o.   MRN: [id number]  [place]. JSE:[id number] DOB: [birth year] DOA: 09/19/2017 PCP: [name], MD  HPI/Subjective: Patient feeling better.  Still with some shortness of breath.  Less wheeze.  Some cough.  Patient hoping to go home without oxygen.  Objective: Vitals:   09/23/17 0444 09/23/17 0729  BP: 114/69   Pulse: (!) 54   Resp: 16   Temp: (!) 97.4 F (36.3 C)   SpO2: 92% 93%    Filed Weights   09/19/17 1945  Weight: 107 kg (236 lb)    ROS: Review of Systems  Constitutional: Negative for chills and fever.  Eyes: Negative for blurred vision.  Respiratory: Positive for cough, shortness of breath and wheezing.   Cardiovascular: Negative for chest pain.  Gastrointestinal: Negative for abdominal pain, constipation, diarrhea, nausea and vomiting.  Genitourinary: Negative for dysuria.  Musculoskeletal: Negative for joint pain.  Neurological: Negative for dizziness and headaches.   Exam: Physical Exam  Constitutional: He is oriented to person, place, and time.  HENT:  Nose: No mucosal edema.  Mouth/Throat: No oropharyngeal exudate or posterior oropharyngeal edema.  Eyes: Conjunctivae, EOM and lids are normal. Pupils are equal, round, and reactive to light.  Neck: No JVD present. Carotid bruit is not present. No edema present. No thyroid mass and no thyromegaly present.  Cardiovascular: S1 normal and S2 normal. Exam reveals no gallop.  No murmur heard. Pulses:      Dorsalis pedis pulses are 2+ on the right side, and 2+ on the left side.  Respiratory: No respiratory distress. He has decreased breath sounds in the right middle field, the right lower field, the left middle field and the left lower field. He has wheezes in the right lower field and the left lower field. He has no rhonchi. He has no rales.  GI: Soft. Bowel sounds are normal. There is no tenderness.   Musculoskeletal:       Right ankle: He exhibits no swelling.       Left ankle: He exhibits no swelling.  Lymphadenopathy:    He has no cervical adenopathy.  Neurological: He is alert and oriented to person, place, and time. No cranial nerve deficit.  Skin: Skin is warm. No rash noted. Nails show no clubbing.  Psychiatric: He has a normal mood and affect.      Data Reviewed: Basic Metabolic Panel: Recent Labs  Lab 09/19/17 1941 09/20/17 0409 09/22/17 0421  NA 137 138 141  K 3.6 3.7 3.8  CL 103 105 105  CO2 24 26 27   GLUCOSE 108* 265* 114*  BUN 11 13 24*  CREATININE 0.87 0.91 1.05  CALCIUM 8.5* 8.6* 8.7*   Liver Function Tests: Recent Labs  Lab 09/19/17 1941  AST 20  ALT 18  ALKPHOS 67  BILITOT 1.6*  PROT 7.3  ALBUMIN 4.1   CBC: Recent Labs  Lab 09/19/17 1941 09/20/17 0409  WBC 14.8* 10.3  HGB 15.0 14.5  HCT 44.0 42.6  MCV 89.5 90.1  PLT 152 151   Cardiac Enzymes: Recent Labs  Lab 09/19/17 1941  TROPONINI <0.03   BNP (last 3 results) Recent Labs    09/19/17 1941  BNP 33.0     Recent Results (from the past 240 hour(s))  Blood culture (routine x 2)     Status: None (Preliminary result)   Collection Time: 09/19/17  8:49 PM  Result Value  Ref Range Status   Specimen Description BLOOD LEFT ANTECUBITAL  Final   Special Requests   Final    BOTTLES DRAWN AEROBIC AND ANAEROBIC Blood Culture adequate volume   Culture   Final    NO GROWTH 4 DAYS Performed at [facility], [address], [place], [place] [address]    Report Status PENDING  Incomplete  Blood culture (routine x 2)     Status: None (Preliminary result)   Collection Time: 09/19/17  8:49 PM  Result Value Ref Range Status   Specimen Description BLOOD RIGHT WRIST  Final   Special Requests   Final    BOTTLES DRAWN AEROBIC AND ANAEROBIC Blood Culture adequate volume   Culture   Final    NO GROWTH 4 DAYS Performed at [facility], [address] [place], [place]  [address]    Report Status PENDING  Incomplete     Scheduled Meds: . apixaban  5 mg Oral BID  . azithromycin  500 mg Oral q1800  . budesonide (PULMICORT) nebulizer solution  0.5 mg Nebulization BID  . ipratropium-albuterol  3 mL Nebulization TID  . methylPREDNISolone (SOLU-MEDROL) injection  40 mg Intravenous Daily  . simvastatin  20 mg Oral Daily  . umeclidinium bromide  1 puff Inhalation Daily   Continuous Infusions: . cefTRIAXone (ROCEPHIN) IVPB 1 gram/50 mL D5W Stopped (09/22/17 2212)    Assessment/Plan:  1. Acute hypoxic respiratory failure.  Pulse ox 86% on room air with ambulation and 88% at rest.  Continue oxygen supplementation.  Would like to get off oxygen prior to disposition.  Still hoping to get the patient off oxygen. 2. COPD exacerbation.  Continue IV Solu-Medrol, DuoNeb nebulizer solution and budesonide nebulizers. 3. Pneumonia left lower lobe on Rocephin and Zithromax 4. History of pulmonary emboli on Eliquis 5. Hyperlipidemia unspecified on simvastatin 6. Impaired fasting glucose.  Patient not a diabetic since hemoglobin A1c 5.5.  Code Status:     Code Status Orders  (From admission, onward)        Start     Ordered   09/19/17 2215  Full code  Continuous     09/19/17 2214    Code Status History    Date Active Date Inactive Code Status Order ID Comments User Context   This patient has a current code status but no historical code status.    Advance Directive Documentation     Most Recent Value  Type of Advance Directive  Healthcare Power of Attorney  Pre-existing out of facility DNR order (yellow form or pink MOST form)  No data  "MOST" Form in Place?  No data     Disposition Plan: Once off oxygen and breathing better can go home  Antibiotics:  Rocephin  Zithromax  Time spent: 25 minutes  [place]

## 2017-09-24 ENCOUNTER — Telehealth: Payer: Self-pay | Admitting: Internal Medicine

## 2017-09-24 ENCOUNTER — Other Ambulatory Visit: Payer: Self-pay | Admitting: Internal Medicine

## 2017-09-24 LAB — CULTURE, BLOOD (ROUTINE X 2)
CULTURE: NO GROWTH
Culture: NO GROWTH
SPECIAL REQUESTS: ADEQUATE
SPECIAL REQUESTS: ADEQUATE

## 2017-09-24 MED ORDER — ARFORMOTEROL TARTRATE 15 MCG/2ML IN NEBU: 15.0000 ug | INHALATION_SOLUTION | Freq: Two times a day (BID) | RESPIRATORY_TRACT | 3 refills | Status: DC

## 2017-09-24 NOTE — Telephone Encounter (Signed)
The 2 meds are very different.   The advair has a steroid and a bronchodilator.   I can prescribe Brovana in a nebulized form to get him the steroid part  if we can get him a home nebulizer and this will take the place of the Advair as long as he has a short acting  albuterol inhaler

## 2017-09-24 NOTE — Telephone Encounter (Signed)
Transition Care Management Follow-up Telephone Call  How have you been since you were released from the hospital? Patient stated he is feeling better but still feels SOB.   Do you understand why you were in the hospital? yes   Do you understand the discharge instrcutions? yes  Items Reviewed:  Medications reviewed: yes  Allergies reviewed: yes  Dietary changes reviewed: yes  Referrals reviewed: yes   Functional Questionnaire:   Activities of Daily Living (ADLs):   He states they are independent in the following: ambulation, bathing and hygiene, feeding, continence, grooming, toileting and dressing States they require assistance with the following: No assistance requested.   Any transportation issues/concerns?: no   Any patient concerns? Yes, would like to discuss his inhaler with PCP that all the inhaler's cannot afford. Says, he cannot afford Incruse and Advair he has enough Incruse to last several months, he is asking to use the Incruse before switching to Advair.   Confirmed importance and date/time of follow-up visits scheduled: yes   Confirmed with patient if condition begins to worsen call PCP or go to the ER.  Patient was given the Call-a-Nurse line [phone]: yes

## 2017-09-24 NOTE — Telephone Encounter (Signed)
Please advise 

## 2017-09-25 MED ORDER — BUDESONIDE 0.25 MG/2ML IN SUSP: 0.2500 mg | Freq: Two times a day (BID) | RESPIRATORY_TRACT | 12 refills | Status: DC

## 2017-09-25 NOTE — Telephone Encounter (Signed)
I  Will try budesonide  It may be cheaper . Two times daily.  It's the steroid in Advair but should be covered under medicare Part D if it is used in the nebulizer, shouldn't it?

## 2017-09-25 NOTE — Telephone Encounter (Signed)
Patient stated he could not afford the [employer] will not pay on this one at all the Advair just gets him in doughnut hole fast and then he has to pay  5000. 00 out of pocket before they will start paying again.

## 2017-09-26 NOTE — Telephone Encounter (Signed)
Patient is going to check with insurance first to see if they will cover medication , he will return call to office when he finds out.

## 2017-09-29 ENCOUNTER — Ambulatory Visit: Payer: Medicare Other

## 2017-09-29 ENCOUNTER — Encounter: Payer: Self-pay | Admitting: Internal Medicine

## 2017-09-29 ENCOUNTER — Ambulatory Visit (INDEPENDENT_AMBULATORY_CARE_PROVIDER_SITE_OTHER): Payer: Medicare Other | Admitting: Internal Medicine

## 2017-09-29 VITALS — BP 132/74 | HR 86 | Temp 97.7°F | Resp 15 | Ht 75.0 in | Wt 242.6 lb

## 2017-09-29 DIAGNOSIS — Z09 Encounter for follow-up examination after completed treatment for conditions other than malignant neoplasm: Secondary | ICD-10-CM

## 2017-09-29 DIAGNOSIS — J189 Pneumonia, unspecified organism: Secondary | ICD-10-CM

## 2017-09-29 DIAGNOSIS — J432 Centrilobular emphysema: Secondary | ICD-10-CM | POA: Diagnosis not present

## 2017-09-29 DIAGNOSIS — E786 Lipoprotein deficiency: Secondary | ICD-10-CM | POA: Diagnosis not present

## 2017-09-29 DIAGNOSIS — E559 Vitamin D deficiency, unspecified: Secondary | ICD-10-CM

## 2017-09-29 DIAGNOSIS — Z23 Encounter for immunization: Secondary | ICD-10-CM | POA: Diagnosis not present

## 2017-09-29 DIAGNOSIS — E538 Deficiency of other specified B group vitamins: Secondary | ICD-10-CM

## 2017-09-29 DIAGNOSIS — E785 Hyperlipidemia, unspecified: Secondary | ICD-10-CM

## 2017-09-29 DIAGNOSIS — J181 Lobar pneumonia, unspecified organism: Secondary | ICD-10-CM

## 2017-09-29 LAB — LIPID PANEL
CHOLESTEROL: 135 mg/dL (ref 0–200)
HDL: 42.5 mg/dL (ref >39.00)
LDL CALC: 57 mg/dL (ref 0–99)
NonHDL: 92.63
Total CHOL/HDL Ratio: 3
Triglycerides: 177 mg/dL — ABNORMAL HIGH (ref 0.0–149.0)
VLDL: 35.4 mg/dL (ref 0.0–40.0)

## 2017-09-29 LAB — VITAMIN B12: VITAMIN B 12: 448 pg/mL (ref 211–911)

## 2017-09-29 LAB — VITAMIN D 25 HYDROXY (VIT D DEFICIENCY, FRACTURES): VITD: 100.59 ng/mL — AB (ref 30.00–100.00)

## 2017-09-29 NOTE — Patient Instructions (Addendum)
Taking an antibiotic can create an imbalance in the normal population of bacteria that live in the small intestine.  This imbalance can persist for 3 months.    Taking a probiotic ( Align, Floraque or Culturelle), the generic version of one of these over the counter medications, or an alternative form (kombucha,  Yogurt, or another dietary source) for a minimum of 3 weeks may help prevent a serious antibiotic associated diarrhea  Called clostridium dificile colitis that occurs when the bacteria population is altered .      Memory Dance is a combination steroid plus log acting bronchodilator and may be less expensive on Good [facility]    Return in 4 weeks for a chest x ray to ensure  resolution of the infiltrates seen in December

## 2017-09-29 NOTE — Progress Notes (Signed)
Subjective:  Patient ID: Seth Roche., male    DOB: April 14, 1941  Age: 77 y.o. MRN: [id number]  CC: The primary encounter diagnosis was Community acquired pneumonia of left lower lobe of lung ([place]). Diagnoses of Hyperlipidemia with low HDL, Vitamin D deficiency, B12 deficiency, Need for 23-polyvalent pneumococcal polysaccharide vaccine, Centrilobular emphysema ([place]), and Hospital discharge follow-up were also pertinent to this visit.  HPI Seth KILGALLON Sr. presents for hospital follow up/  Admitted to [facility] on Dec 28 with acute hypoxic respratory failure secondary to CAP ([place]) and  COPD exacerbation .  He was treated with broad spectrum abx,  IV solumedrol , nebulized bronchdilators and  supplememtal oxygen which was weaned to room air prior to dischargeto home which occurred  on Jan 1. Discharge medications included a tapering dose of prednisone ,  Ceftin/azithromycin and Advair MDI .  Since discharge he has not been able to fill the rx for Advair  due to cost.  Multiple prescriptions  including nebulized budesonide have been sent to his pharmacy, but he reports today that the copays are too steep.  He used He is using Incruse daily and states that he has  several months left before  he runs out. . The copay on Advair was $45 out of pocket and will put him in the doughnut hole. Only using the albuterol MDI rarely,  as needed.  Last use was a few days ago. Did not feel that it made a difference at all.  He denies wheezing and dyspnea at rest,  States that he tried it because his home pulse ox was a little low (92%).  His home pulse ox readings since d/c have never been  88 %.  He has a persistent cough that is productive of scant amounts of white sputum. He is taking the antibiotics as directed .  Denies diarrhea  But has not been taking a probiotic.  Feels like he is still having some congestion in his chest.  Still more short of breat with activities of daily living than at baseline,  But  denies weakness, fevers,  Unsteady gait,  And chest pain .   Anticoagulation:  Xarelto was changed to Eliquis  Patient has a 59 pack year history of tobacco abuse but refuses to endorse the diagnosis of COPD despite having had abnormal PFTS in 2014  Gets annual Lung CA screening CT; last one in August  noted diffuse bronchial wall thickening and paraseptal emphysema scattered pulmonary noduels,    Aortic atherosclerosis and coronary artery diease noted .  He is tolerating simvastatin and lfts were normal in house  Outpatient Medications Prior to Visit  Medication Sig Dispense Refill  . albuterol (VENTOLIN HFA) 108 (90 Base) MCG/ACT inhaler Inhale 2 puffs into the lungs every 6 (six) hours as needed for wheezing. Reported on 02/28/2016 3.7 g 5  . apixaban (ELIQUIS) 5 MG TABS tablet Take 1 tablet (5 mg total) by mouth 2 (two) times daily. 60 tablet 11  . cefUROXime (CEFTIN) 500 MG tablet Take 1 tablet (500 mg total) by mouth 2 (two) times daily for 7 days. 14 tablet 0  . simvastatin (ZOCOR) 20 MG tablet Take 20 mg by mouth daily.    Marland Kitchen umeclidinium bromide (INCRUSE ELLIPTA) 62.5 MCG/INH AEPB Inhale 1 puff into the lungs daily. 30 each 11  . vitamin B-12 (CYANOCOBALAMIN) 1000 MCG tablet Take 1,000 mcg by mouth daily. Pt takes 2 tabs daily.    Marland Kitchen VITAMIN D, CHOLECALCIFEROL, PO Take  5,000 Units by mouth daily.    Marland Kitchen azithromycin (ZITHROMAX) 250 MG tablet One tab po daily 3 each 0  . budesonide (PULMICORT) 0.25 MG/2ML nebulizer solution Take 2 mLs (0.25 mg total) by nebulization 2 (two) times daily. 60 mL 12  . fluticasone-salmeterol (ADVAIR HFA) 115-21 MCG/ACT inhaler Inhale 2 puffs into the lungs 2 (two) times daily. 1 Inhaler 0  . predniSONE (DELTASONE) 20 MG tablet Take two tabs daily for three days 6 tablet 0   No facility-administered medications prior to visit.     Review of Systems;  Patient denies headache, fevers, malaise, unintentional weight loss, skin rash, eye pain, sinus congestion and  sinus pain, sore throat, dysphagia,  hemoptysis ,chest pain, palpitations, orthopnea, edema, abdominal pain, nausea, melena, diarrhea, constipation, flank pain, dysuria, hematuria, urinary  Frequency, nocturia, numbness, tingling, seizures,  Focal weakness, Loss of consciousness,  Tremor, insomnia, depression, anxiety, and suicidal ideation.      Objective:  BP 132/74 (BP Location: Left Arm, Patient Position: Sitting, Cuff Size: Normal)   Pulse 86   Temp 97.7 F (36.5 C) (Oral)   Resp 15   Ht 6\' 3"  (1.905 m)   Wt 242 lb 9.6 oz (110 kg)   SpO2 94%   BMI 30.32 kg/m   BP Readings from Last 3 Encounters:  09/29/17 132/74  09/23/17 114/69  08/29/17 123/79    Wt Readings from Last 3 Encounters:  09/29/17 242 lb 9.6 oz (110 kg)  09/19/17 236 lb (107 kg)  08/29/17 236 lb (107 kg)    General appearance: alert, cooperative and appears stated age.  Coughing frequently  Ears: normal TM's and external ear canals both ears Throat: lips, mucosa, and tongue normal; teeth and gums normal Neck: no adenopathy, no carotid bruit, supple, symmetrical, trachea midline and thyroid not enlarged, symmetric, no tenderness/mass/nodules Back: symmetric, no curvature. ROM normal. No CVA tenderness. Lungs: clear to auscultation bilaterally with occasional light wheezes,  Good air movement.  Heart: regular rate and rhythm, S1, S2 normal, no murmur, click, rub or gallop Abdomen: soft, non-tender; bowel sounds normal; no masses,  no organomegaly Pulses: 2+ and symmetric Skin: Skin color, texture, turgor normal. No rashes or lesions Lymph nodes: Cervical, supraclavicular, and axillary nodes normal.  Lab Results  Component Value Date   HGBA1C 5.5 09/22/2017   HGBA1C 5.7 10/24/2012   HGBA1C 5.8 10/23/2012    Lab Results  Component Value Date   CREATININE 1.05 09/22/2017   CREATININE 0.91 09/20/2017   CREATININE 0.87 09/19/2017    Lab Results  Component Value Date   WBC 10.3 09/20/2017   HGB 14.5  09/20/2017   HCT 42.6 09/20/2017   PLT 151 09/20/2017   GLUCOSE 114 (H) 09/22/2017   CHOL 135 09/29/2017   TRIG 177.0 (H) 09/29/2017   HDL 42.50 09/29/2017   LDLCALC 57 09/29/2017   ALT 18 09/19/2017   AST 20 09/19/2017   NA 141 09/22/2017   K 3.8 09/22/2017   CL 105 09/22/2017   CREATININE 1.05 09/22/2017   BUN 24 (H) 09/22/2017   CO2 27 09/22/2017   TSH 1.82 06/17/2014   PSA 11.44 (H) 11/24/2012   INR 1.5 01/26/2014   HGBA1C 5.5 09/22/2017    Dg Chest 2 View  Result Date: 09/19/2017 CLINICAL DATA:  Shortness of breath EXAM: CHEST  2 VIEW COMPARISON:  05/22/2017, 01/26/2014 FINDINGS: Streaky infiltrate at the lingula and left lung base. No large effusion. Normal heart size. Aortic atherosclerosis. No pneumothorax. IMPRESSION: Streaky infiltrates at the  lingula and left lung base. Electronically Signed   By: Donavan Foil M.D.   On: 09/19/2017 20:10    Assessment & Plan:   Problem List Items Addressed This Visit    Vitamin D deficiency   Relevant Orders   VITAMIN D 25 Hydroxy (Vit-D Deficiency, Fractures) (Completed)   CAP (community acquired pneumonia) - Primary    Diagnosed by chest x ray during Dec 28 admission.  Repeat films needed in 4-6 weeks . Treated with azithromycin and Ceftin.  Continue Ceftin until gone.        Relevant Orders   DG Chest 2 View   COPD (chronic obstructive pulmonary disease) with emphysema ([place])    Continue daly use of Incruse.  Reviewed his formulary today and all steroid MDIs are Tier 3/4 which he is unable to afford.  Advised to see Dr Lake Bells to discuss any available options including sample meds.       Hospital discharge follow-up    Patient is stable post discharge.  40 minutes of face to face  Time was spent with patient reviewing hospital records, medication changes,  And insurance formulary.  Coverage of inhaled medications is limited to Tier 3 and 4 which is cost prohibitive to patient. Fortunately his lung exam is nearly normal and  he is no longer hypoxiac.   Urged to retrun inf 4 weeks for chest x ray and follow up with Dr Lake Bells for help in getting meds. H e  has no new issues or questions about discharge plans at the visit today for hospital follow up. All labs , imaging studies and progress notes from admission were reviewed with patient today        Hyperlipidemia with low HDL    Using the Framingham risk calculator,  his 10 year risk of coronary artery disease is 27%. Statin therapy was initiated and LDL is now < 70.  LFTS normal no changes today   Lab Results  Component Value Date   CHOL 135 09/29/2017   HDL 42.50 09/29/2017   LDLCALC 57 09/29/2017   TRIG 177.0 (H) 09/29/2017   CHOLHDL 3 09/29/2017   Lab Results  Component Value Date   ALT 18 09/19/2017   AST 20 09/19/2017   ALKPHOS 67 09/19/2017   BILITOT 1.6 (H) 09/19/2017         Relevant Orders   Lipid panel (Completed)    Other Visit Diagnoses    B12 deficiency       Relevant Orders   B12 (Completed)   Need for 23-polyvalent pneumococcal polysaccharide vaccine       Relevant Orders   Pneumococcal polysaccharide vaccine 23-valent greater than or equal to 2yo subcutaneous/IM (Completed)      I have discontinued Lemuel L. Fahr Sr.'s fluticasone-salmeterol, predniSONE, azithromycin, and budesonide. I am also having him maintain his albuterol, umeclidinium bromide, apixaban, vitamin B-12, (VITAMIN D, CHOLECALCIFEROL, PO), simvastatin, and cefUROXime.  No orders of the defined types were placed in this encounter.   Medications Discontinued During This Encounter  Medication Reason  . azithromycin (ZITHROMAX) 250 MG tablet   . predniSONE (DELTASONE) 20 MG tablet   . budesonide (PULMICORT) 0.25 MG/2ML nebulizer solution   . fluticasone-salmeterol (ADVAIR HFA) 115-21 MCG/ACT inhaler     Follow-up: No Follow-up on file.   [name], MD

## 2017-09-30 ENCOUNTER — Other Ambulatory Visit: Payer: Self-pay

## 2017-09-30 NOTE — Assessment & Plan Note (Signed)
Diagnosed by chest x ray during Dec 28 admission.  Repeat films needed in 4-6 weeks . Treated with azithromycin and Ceftin.  Continue Ceftin until gone.

## 2017-09-30 NOTE — Assessment & Plan Note (Signed)
Using the Framingham risk calculator,  his 10 year risk of coronary artery disease is 27%. Statin therapy was initiated and LDL is now < 70.  LFTS normal no changes today   Lab Results  Component Value Date   CHOL 135 09/29/2017   HDL 42.50 09/29/2017   LDLCALC 57 09/29/2017   TRIG 177.0 (H) 09/29/2017   CHOLHDL 3 09/29/2017   Lab Results  Component Value Date   ALT 18 09/19/2017   AST 20 09/19/2017   ALKPHOS 67 09/19/2017   BILITOT 1.6 (H) 09/19/2017

## 2017-09-30 NOTE — Assessment & Plan Note (Signed)
Patient is stable post discharge.  40 minutes of face to face  Time was spent with patient reviewing hospital records, medication changes,  And insurance formulary.  Coverage of inhaled medications is limited to Tier 3 and 4 which is cost prohibitive to patient. Fortunately his lung exam is nearly normal and he is no longer hypoxiac.   Urged to retrun inf 4 weeks for chest x ray and follow up with Dr Lake Bells for help in getting meds. H e  has no new issues or questions about discharge plans at the visit today for hospital follow up. All labs , imaging studies and progress notes from admission were reviewed with patient today

## 2017-09-30 NOTE — Assessment & Plan Note (Signed)
Continue daly use of Incruse.  Reviewed his formulary today and all steroid MDIs are Tier 3/4 which he is unable to afford.  Advised to see Dr Lake Bells to discuss any available options including sample meds.

## 2017-09-30 NOTE — Patient Outreach (Signed)
[facility]) Care Management  09/30/2017  Lolita 1941-09-09 [id number]     EMMI-General Discharge  RED ON EMMI ALERT Day # 4 Date: 09/29/17 Red Alert Reason: " Unfilled prescriptions? Yes   Able to fill today/tomorrow? No"   Outreach attempt #1 to patient. Spoke with patient. Reviewed and addressed red alert. He instantly becomes upset and states that he has Medicare Part D and because of that he can not get any help with his meds. He voices all the inhalers that they ordered for patient at time of discharge are entirely too expensive and he is not able to afford them. He had PCP visit on yesterday and MD recommended changing his inhalers. However, patient states he looked into those inhalers and they are all too expensive as well and he is unable to get them. Patient voices a lot of frustration and dissatisfaction with the "system." He reports that he is "not convinced" that he has COPD anyway. He voices checking his pulse ox at home and it runs 96-97%. He has a long standing history of smoking(greater than 50 years). He voices the only thing wrong with his lungs is that he had a PE a few years ago. He is taking anticoagulation med for that and is concerned about how much that is costing him as well. After a long discussion with patient he did consent to [place] referral for possible resources and assistance. Patient has completed automated post discharge calls.       Plan: RN CM will notify [facility] administrative assistant of case status. RN CM will send [place] referral for possible med assistance.   Enzo Montgomery, RN,BSN,CCM [place] [facility] Telephonic Care Management Coordinator Direct Phone: [phone] Toll Free: [phone] Fax: [phone]

## 2017-10-01 ENCOUNTER — Other Ambulatory Visit: Payer: Self-pay | Admitting: Pharmacist

## 2017-10-01 NOTE — Patient Outreach (Signed)
[facility]) Care Management  Day   10/01/2017  Seth Perez Sr. December 13, 1940 [id number]  77 year old male referred to [place] Management from [facility] call alert regarding inability to pick up prescriptions.  Seth Perez requested for patient assistance with inhalers.  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    Recent hospitalization 09/19/2017 - 09/23/2017 for CAP and AECOPD, discharged home on oral antibiotics and prednisone as well as new inhaler, Advair (fluticasone-salmeterol - LABA + ICS).  Per notes, patient unable to afford Advair.  PCP ordered Pulmicort (budesonide - ICS) nebulizers however then discontinued this as well due to cost.  Patient remains on PRN albuterol and Incruse (umeclidinium bromide - LAMA).      Insurance verified as [place] [phone] per medicare.gov website.   Subjective: Successful telephone call with Seth Perez.  HIPAA identifiers verified. Patient agreeable to review medications and discuss patient assistance options.    Patient reports he does not believe he has COPD.  He reports that Albuterol is "useless."   He reports he previously was on Spiriva and Advair in 2014, therapy narrowed to Spiriva monotherapy from 2014 -2018, then changed to Incruse monotherapy in 2018 due to insurance formulary.  Patient reports he has several months worth of Incruse, likely enough to last through May 2019.    Patient has new Medicare Part D insurance, [place], in 2019 and states he also has a supplement plan.   Patient reports he cannot afford the inhaler co-pay for Advair prescribed at discharge on 1/10219 nor the Incruse, Spiriva, or Breo Ellipta inhaler options discussed with PCP on  09/29/2016.  He reports he has the Pulmicort nebulizers but does not know how much it cost him.  He has not started using them.  He does not have a nebulizer machine yet but states he will "buy it off the internet" if needed rather than use a prescription because it is cheaper.  He reports he wants to go to [place] or [place] for his medications.    Patient reports his monthly income is ~$4000 / month which includes SSI ~$1500 and a pension of $2500 however he thinks that his pension will run out sometime this year.    Patient reports he picked up his discharge antibiotics and is taking them as scheduled.   Objective:   Encounter Medications: Outpatient Encounter Medications as of 10/01/2017  Medication Sig  . albuterol (VENTOLIN HFA) 108 (90 Base) MCG/ACT inhaler Inhale 2 puffs into the lungs every 6 (six) hours as needed for wheezing. Reported on 02/28/2016  . apixaban (ELIQUIS) 5 MG TABS tablet Take 1 tablet (5 mg total) by mouth 2 (two) times daily.  . [EXPIRED] cefUROXime (CEFTIN) 500 MG tablet Take 1 tablet (500 mg total) by mouth 2 (two) times daily for 7 days.  . simvastatin (ZOCOR) 20 MG tablet Take 20 mg by mouth daily.  Marland Kitchen umeclidinium bromide (INCRUSE ELLIPTA) 62.5 MCG/INH AEPB Inhale 1 puff into the lungs daily.  . vitamin B-12 (CYANOCOBALAMIN) 1000 MCG tablet Take 1,000 mcg by mouth daily. Pt takes 2 tabs daily.  Marland Kitchen VITAMIN D, CHOLECALCIFEROL, PO Take 5,000 Units by mouth daily.   No facility-administered encounter medications on file as of 10/01/2017.     Functional Status: In  your present state of health, do you have any difficulty performing the following activities: 09/19/2017 08/04/2017  Hearing? Y Y  Comment - Audilogy testing deferred per patient preference  Vision? N N  Difficulty concentrating or making decisions? N N  Walking or climbing stairs? Y N  Dressing or bathing? N N  Doing errands, shopping? N N  Preparing Food and eating ? - N  Using the Toilet? - N  In the past  six months, have you accidently leaked urine? - N  Do you have problems with loss of bowel control? - N  Managing your Medications? - N  Managing your Finances? - N  Housekeeping or managing your Housekeeping? - N  Some recent data might be hidden    Fall/Depression Screening: Fall Risk  08/04/2017 08/30/2016 08/02/2016  Falls in the past year? No Yes Yes  Comment - Emmi Telephone Survey: data to providers prior to load -  Number falls in past yr: - 1 1  Comment - Emmi Telephone Survey Actual Response = 1 -  Injury with Fall? - No Yes  Comment - - R shoulder was tender after fall.  He sought medical care.  Stable.  Risk Factor Category  - - -  Risk for fall due to : - - -  Risk for fall due to: Comment - - -  Follow up - - Education provided;Falls prevention discussed  Comment - - Fell playing soccer with grand daughter   [facility] 2/9 Scores 08/04/2017 08/02/2016 08/30/2015 08/03/2015 11/02/2012  PHQ - 2 Score 0 0 0 0 0  PHQ- 9 Score 0 - - - -    ASSESSMENT: Date Discharged from Hospital: 09/23/2017 Date Medication Reconciliation Performed: 10/02/2017  Medications Discontinued at Discharge:  Ipratropium-Albuterol (combivent)  New Medications at Discharge:   Azithromycin  Cefuroxime  Fluticasone-salmeterol  Prednisone  Patient was recently discharged from hospital and all medications have been reviewed  Drugs sorted by system:  Neurologic/Psychologic: none   Cardiovascular: Apixaban, simvastatin  Pulmonary/Allergy: albuterol, fluticasone-salmeterol, umeclidinium bromide  Gastrointestinal: none  Endocrine: prednisone  Renal: none  Topical: none  Pain: none  Vitamins/Minerals:vitamin B12, vitamin D  Infectious Diseases: Azithromycin, cefuroxime  Miscellaneous: none  Duplications in therapy: no issues Gaps in therapy: COPD -treatment options limited due to cost, see below for medication assistance Medications to avoid in the elderly: no issues Drug  interactions: no issues Other issues noted:  Medication assistance: Patient unable to afford monthly Tier 3 and Tier 4 co-pays for inhaler therapy and is only using Incruse Ellipta daily / Albuterol PRN.    Nebulizers may be less expensive than inhaler co-pays if pharmacy can do primary billing to Medicare Part B / secondary billing to supplement F.  Nebulizer prescriptions will need diagnosis codes written on prescriptions for pharmacy to use.    Duonebs (ipratropium-albuterol) available at [facility] for $4 / box of 25 vials as substitute for Incruse. If using QID, cost = $16 / 25 day supply.    [employer] PAP available for [employer] (mometasone-formoterol) as substitute for Advair. Patient's income is borderline for program requirements.   Patient not eligible for other possible PAP's for Spiriva, Advair, Breo, ect due to monthly income being too high.  PLAN: I will route my note to Dr. Derrel Nip regarding medication assistance options for Advair --> Dulera substitution and Duonebs prescription.  I will update patient once I hear back from provider.   Ralene Bathe, PharmD, [place] [phone]

## 2017-10-03 ENCOUNTER — Ambulatory Visit: Payer: Self-pay | Admitting: Pharmacist

## 2017-10-06 ENCOUNTER — Ambulatory Visit: Payer: Self-pay | Admitting: Pharmacist

## 2017-10-06 ENCOUNTER — Telehealth: Payer: Self-pay

## 2017-10-06 ENCOUNTER — Other Ambulatory Visit: Payer: Self-pay | Admitting: Pharmacist

## 2017-10-06 ENCOUNTER — Other Ambulatory Visit: Payer: Self-pay | Admitting: Pharmacy Technician

## 2017-10-06 NOTE — Patient Outreach (Signed)
[place] [facility]) Care Management  10/06/2017  [place] 06-Aug-1941 [id number]   Received [employer] patient assistance referral from [facility] (Pharmacist) for [facility] inhaler. Mailed out patient portion of application 06/00/45. Will request that [facility] Pharmacist resident Seth Perez take provider portion of application to Dr. Lupita Dawn office on Monday 10/13/17.  Seth Perez [place], [place] Management [phone]

## 2017-10-06 NOTE — Telephone Encounter (Signed)
Copied from [place]. Topic: General - Other >> Oct 06, 2017 12:40 PM Carolyn Stare wrote:  Jaclyn Shaggy from [facility]  said she sent a message to Dr  Derrel Nip about pt gettting his [facility] for free they will apply . She would like a call back because she will be send a application to Dr Derrel Nip   [phone]

## 2017-10-06 NOTE — Telephone Encounter (Signed)
LMTCB. Please transfer Jaclyn Shaggy to office so we may speak with her.

## 2017-10-06 NOTE — Patient Outreach (Signed)
[place] [facility]) Care Management  10/06/2017  [place] 1941-03-22 [id number]   Unsuccessful telephone call to Mr. Seyler today.  I left a HIPPA compliant voicemail on the home phone.    I have not heard back from Dr. Derrel Nip regarding inhaler substitution for Advair --> Dulera.  I will call the office and leave a message for provider's nurse to return my call.    Plan: I will follow-up with Mr. Favaro once I hear back from provider regarding inhalers.  Care coordination call placed to [place] who works at clinic with Dr. Derrel Nip.  Per [facility] pharmacist, Dr. Derrel Nip agrees to inhaler substitution.    Plan: I will route patient assistance letter to pharmacy technician, Etter Sjogren, who will coordinate application process for [facility] through [employer].  She will assist with obtaining all pertinent documents from both patient and Dr. Derrel Nip and submit application once completed.    Ralene Bathe, PharmD, [place] [phone]

## 2017-10-07 ENCOUNTER — Ambulatory Visit: Payer: Self-pay | Admitting: Pharmacy Technician

## 2017-10-08 ENCOUNTER — Other Ambulatory Visit: Payer: Self-pay | Admitting: Pharmacy Technician

## 2017-10-08 NOTE — Patient Outreach (Signed)
[place] [facility]) Care Management  10/08/2017  [place] August 29, 1941 [id number]  Unsuccessful Outreach call to patient in reference to [employer] [facility]) patient assistance application. Left HIPAA appropriate voicemail to return my call.  Maud Deed [place], [place] Management [phone]

## 2017-10-09 ENCOUNTER — Telehealth: Payer: Self-pay | Admitting: Pharmacist

## 2017-10-09 NOTE — Patient Outreach (Signed)
[facility]) Care Management  10/09/2017  [place] 1941-04-21 [id number]   Unsuccessful telephone call to Mr. Bertsch today regarding patient assistance.  I left a HIPPA compliant voicemail on the home phone.    Plan: I will follow-up with Mr. Joslyn next week regarding medication assistance.  Ralene Bathe, PharmD, [place] [phone]

## 2017-10-14 NOTE — Telephone Encounter (Signed)
Deirdre Pippins brought Dr. Derrel Nip a form to fill out to get the [facility] approved for the pt.

## 2017-10-17 ENCOUNTER — Ambulatory Visit: Payer: Self-pay | Admitting: Pharmacy Technician

## 2017-10-21 ENCOUNTER — Other Ambulatory Visit: Payer: Self-pay | Admitting: Pharmacy Technician

## 2017-10-21 NOTE — Patient Outreach (Addendum)
[place] [facility]) Care Management  10/21/2017  [place] [birth year] [id number]   Unsuccessful outreach call #2 to patient in reference to patient assistance for [facility].  Maud Deed [place], [place] Management [phone]

## 2017-10-23 ENCOUNTER — Other Ambulatory Visit: Payer: Self-pay | Admitting: Pharmacy Technician

## 2017-10-23 ENCOUNTER — Telehealth: Payer: Self-pay | Admitting: Pharmacist

## 2017-10-23 NOTE — Patient Outreach (Signed)
[place] [facility]) Care Management  10/23/2017  [place] Oct 02, 1940 [id number]  Successful call placed to Mr. Klem regarding medication assistance.  HIPAA identifiers verified. Mr. Paye has received mailed application for [facility] but has not completed it yet.  He reports that he is still interested in applying and will try to complete and return application in the next 2 weeks.   Plan: Follow-up with Mr. Schafer in 2 weeks regarding his portion of application if not received yet by [facility].   Ralene Bathe, PharmD, [place] [phone]

## 2017-10-23 NOTE — Patient Outreach (Signed)
[place]) Care Management  10/23/2017  [place] [birth year] [id number]   Unsuccessful call #3 to Seth Perez in reference to [employer] patient assistance. Left HIPAA appropriate voicemail to return my call.  Maud Deed [place], [place] Management [phone]

## 2017-11-06 ENCOUNTER — Ambulatory Visit: Payer: Self-pay | Admitting: Pharmacist

## 2017-11-07 ENCOUNTER — Other Ambulatory Visit: Payer: Self-pay | Admitting: Pharmacist

## 2017-11-07 ENCOUNTER — Ambulatory Visit: Payer: Self-pay | Admitting: Pharmacist

## 2017-11-07 NOTE — Patient Outreach (Signed)
[place] [facility]) Care Management  11/07/2017  [place] 09/08/1941 [id number]   Unsuccessful telephone call to Mr. Pelissier today.  I left a HIPPA compliant voicemail on the home phone.    Plan: I will follow-up with Mr. Kluver next week regarding medication assistance.   Ralene Bathe, PharmD, [place] [phone]

## 2017-11-10 ENCOUNTER — Other Ambulatory Visit: Payer: Self-pay | Admitting: Pharmacist

## 2017-11-10 ENCOUNTER — Ambulatory Visit: Payer: Self-pay | Admitting: Pharmacist

## 2017-11-10 NOTE — Patient Outreach (Signed)
[place] [facility]) Care Management  11/10/2017  [place] 06-05-1941 [id number]  Unsuccessful telephone call #2 to Mr. Ricciuti today.  I left a HIPPA compliant voicemail on the home phone.    Plan: I will mail patient a letter regarding [facility] services and follow-up with Mr. Friesen in 10 business days regarding medication assistance.    Ralene Bathe, PharmD, [place] [phone]

## 2017-11-21 ENCOUNTER — Ambulatory Visit: Payer: Self-pay | Admitting: Pharmacist

## 2017-11-21 ENCOUNTER — Other Ambulatory Visit: Payer: Self-pay | Admitting: Pharmacist

## 2017-11-21 NOTE — Patient Outreach (Signed)
[place] [facility]) Care Management  11/21/2017  [place] 12-19-1940 [id number]   Unsuccessful telephone call to #3 to patient today.  I left a HIPPA compliant voicemail on the home phone.    Plan: I will close patient case at this time due to inability to maintain contact with Mr. Tietje. I will send patient closure letter,  MD closure letter and alert [facility] CMA.    Ralene Bathe, PharmD, [place] [phone]

## 2017-12-05 ENCOUNTER — Other Ambulatory Visit: Payer: Self-pay | Admitting: Internal Medicine

## 2017-12-18 ENCOUNTER — Ambulatory Visit (INDEPENDENT_AMBULATORY_CARE_PROVIDER_SITE_OTHER): Payer: Medicare Other

## 2017-12-18 DIAGNOSIS — R918 Other nonspecific abnormal finding of lung field: Secondary | ICD-10-CM | POA: Diagnosis not present

## 2017-12-18 DIAGNOSIS — J181 Lobar pneumonia, unspecified organism: Secondary | ICD-10-CM

## 2018-03-06 ENCOUNTER — Other Ambulatory Visit: Payer: Self-pay | Admitting: Internal Medicine

## 2018-06-10 ENCOUNTER — Other Ambulatory Visit: Payer: Self-pay | Admitting: Internal Medicine

## 2018-08-05 ENCOUNTER — Ambulatory Visit (INDEPENDENT_AMBULATORY_CARE_PROVIDER_SITE_OTHER): Payer: Medicare Other

## 2018-08-05 VITALS — BP 110/70 | HR 60 | Temp 97.9°F | Resp 16 | Ht 74.5 in | Wt 239.4 lb

## 2018-08-05 DIAGNOSIS — Z23 Encounter for immunization: Secondary | ICD-10-CM | POA: Diagnosis not present

## 2018-08-05 DIAGNOSIS — Z0000 Encounter for general adult medical examination without abnormal findings: Secondary | ICD-10-CM | POA: Diagnosis not present

## 2018-08-05 NOTE — Patient Instructions (Addendum)
  [name] , Thank you for taking time to come for your Medicare Wellness Visit. I appreciate your ongoing commitment to your health goals. Please review the following plan we discussed and let me know if I can assist you in the future.   Follow up as needed.    Bring a copy of your [place] and/or Living Will to be scanned into chart.  Have a great day! These are the goals we discussed: Goals      Patient Stated   . Increase physical activity (pt-stated)     Walk more for exercise       This is a list of the screening recommended for you and due dates:  Health Maintenance  Topic Date Due  . Tetanus Vaccine  06/23/2024  . Flu Shot  Completed  . Pneumonia vaccines  Completed

## 2018-08-05 NOTE — Progress Notes (Addendum)
Subjective:   Seth LAWLESS Sr. is a 77 y.o. male who presents for Medicare Annual/Subsequent preventive examination.  Review of Systems:  No ROS.  Medicare Wellness Visit. Additional risk factors are reflected in the social history. Cardiac Risk Factors include: advanced age (>30men, >44 women);male gender     Objective:    Vitals: BP 110/70 (BP Location: Left Arm, Patient Position: Sitting, Cuff Size: Normal)   Pulse 60   Temp 97.9 F (36.6 C) (Oral)   Resp 16   Ht 6' 2.5" (1.892 m)   Wt 239 lb 6.4 oz (108.6 kg)   SpO2 97%   BMI 30.33 kg/m   Body mass index is 30.33 kg/m.  Advanced Directives 08/05/2018 09/19/2017 09/19/2017 08/04/2017 08/27/2016 08/02/2016 08/03/2015  Does Patient Have a Medical Advance Directive? Yes Yes No Yes Yes Yes Yes  Type of Advance Directive Living will;Healthcare Power of [place];Living will Living will  Does patient want to make changes to medical advance directive? No - Patient declined No - Patient declined - No - Patient declined - - No - Patient declined  Copy of [place] in Chart? No - copy requested No - copy requested - No - copy requested - No - copy requested No - copy requested    Tobacco Social History   Tobacco Use  Smoking Status Former Smoker  . Packs/day: 1.00  . Years: 59.00  . Pack years: 59.00  . Types: Cigarettes  . Last attempt to quit: 10/12/2012  . Years since quitting: 5.8  Smokeless Tobacco Never Used     Counseling given: Not Answered   Clinical Intake:  Pre-visit preparation completed: Yes  Pain : No/denies pain     Nutritional Status: BMI > 30  Obese Diabetes: No  How often do you need to have someone help you when you read instructions, pamphlets, or other written materials from your doctor or pharmacy?: 1 - Never  Interpreter Needed?: No     Past  Medical History:  Diagnosis Date  . Arthritis   . Chicken pox   . COPD (chronic obstructive pulmonary disease) ([facility])   . Pulmonary emboli ([address])    on Xarelto   Past Surgical History:  Procedure Laterality Date  . CHOLECYSTECTOMY  2008  . TONSILLECTOMY AND ADENOIDECTOMY  1947   Family History  Problem Relation Age of Onset  . Arthritis Mother   . Lung cancer Brother        was a smoker  . Breast cancer Maternal Grandmother   . Heart disease Maternal Grandfather   . Asthma Father   . Emphysema Father   . Heart disease Paternal Grandfather    Social History   Socioeconomic History  . Marital status: Divorced    Spouse name: Not on file  . Number of children: Not on file  . Years of education: Not on file  . Highest education level: Not on file  Occupational History  . Not on file  Social Needs  . Financial resource strain: Patient refused  . Food insecurity:    Worry: Patient refused    Inability: Patient refused  . Transportation needs:    Medical: Patient refused    Non-medical: Patient refused  Tobacco Use  . Smoking status: Former Smoker    Packs/day: 1.00    Years: 59.00    Pack years: 59.00    Types: Cigarettes  Last attempt to quit: 10/12/2012    Years since quitting: 5.8  . Smokeless tobacco: Never Used  Substance and Sexual Activity  . Alcohol use: Yes    Alcohol/week: 2.0 standard drinks    Types: 2 Standard drinks or equivalent per week  . Drug use: No  . Sexual activity: Not Currently  Lifestyle  . Physical activity:    Days per week: Patient refused    Minutes per session: Patient refused  . Stress: Patient refused  Relationships  . Social connections:    Talks on phone: Patient refused    Gets together: Patient refused    Attends religious service: Patient refused    Active member of club or organization: Patient refused    Attends meetings of clubs or organizations: Patient refused    Relationship status: Patient refused  Other Topics  Concern  . Not on file  Social History Narrative  . Not on file    Outpatient Encounter Medications as of 08/05/2018  Medication Sig  . albuterol (VENTOLIN HFA) 108 (90 Base) MCG/ACT inhaler Inhale 2 puffs into the lungs every 6 (six) hours as needed for wheezing. Reported on 02/28/2016  . ELIQUIS 5 MG TABS tablet TAKE 1 TABLET BY MOUTH TWICE A DAY  . simvastatin (ZOCOR) 20 MG tablet TAKE 1 TABLET BY MOUTH AT BEDTIME  . umeclidinium bromide (INCRUSE ELLIPTA) 62.5 MCG/INH AEPB Inhale 1 puff into the lungs daily.  . vitamin B-12 (CYANOCOBALAMIN) 1000 MCG tablet Take 1,000 mcg by mouth daily. Pt takes 2 tabs daily.  Marland Kitchen VITAMIN D, CHOLECALCIFEROL, PO Take 5,000 Units by mouth daily.  . [DISCONTINUED] simvastatin (ZOCOR) 20 MG tablet Take 20 mg by mouth daily.   No facility-administered encounter medications on file as of 08/05/2018.     Activities of Daily Living In your present state of health, do you have any difficulty performing the following activities: 08/05/2018 09/19/2017  Hearing? Y Y  Comment Hearing aid, R ear -  Vision? N N  Difficulty concentrating or making decisions? N N  Walking or climbing stairs? Y Y  Comment COPD and leg weakness with walking long distances -  Dressing or bathing? N N  Doing errands, shopping? N N  Preparing Food and eating ? N -  Using the Toilet? N -  In the past six months, have you accidently leaked urine? N -  Do you have problems with loss of bowel control? N -  Managing your Medications? N -  Managing your Finances? N -  Housekeeping or managing your Housekeeping? N -  Some recent data might be hidden    Patient Care Team: [name], MD as PCP - General (Internal Medicine)   Assessment:   This is a routine wellness examination for Seth Perez.  The goal of the wellness visit is to assist the patient how to close the gaps in care and create a preventative care plan for the patient.   The roster of all physicians providing medical care  to patient is listed in the Snapshot section of the chart.  Taking calcium VIT D as appropriate/Osteoporosis risk reviewed.    Safety issues reviewed; Smoke and carbon monoxide detectors in the home. No firearms in the home. Wears seatbelts when driving or riding with others. No violence in the home.  They do not have excessive sun exposure.  Discussed the need for sun protection: hats, long sleeves and the use of sunscreen if there is significant sun exposure.  Patient is alert, normal appearance,  oriented to person/place/and time. Correctly identified the president of the [place] and recalls of 2/3 words. Performs simple calculations and can read correct time from watch face. Displays appropriate judgement.  No failures at ADL's or IADL's.    BMI- discussed the importance of a healthy diet, water intake and the benefits of aerobic exercise. Educational material provided.   24 hour diet recall: Regular diet  Dental- dentures.  Sleep patterns- Sleeps ok at night.  High dose influenza vaccine administered R deltoid, tolerated well. Educational material provided.  Health maintenance gaps- closed.  Patient Concerns: None at this time. Follow up with PCP as needed.  Exercise Activities and Dietary recommendations Current Exercise Habits: The patient does not participate in regular exercise at present  Goals      Patient Stated   . Increase physical activity (pt-stated)     Walk more for exercise       Fall Risk Fall Risk  08/05/2018 08/04/2017 08/30/2016 08/02/2016 08/30/2015  Falls in the past year? 0 No Yes Yes Yes  Comment - - Emmi Telephone Survey: data to providers prior to load - -  Number falls in past yr: - - 1 1 2  or more  Comment - - Emmi Telephone Survey Actual Response = 1 - -  Injury with Fall? - - No Yes No  Comment - - - R shoulder was tender after fall.  He sought medical care.  Stable. -  Risk Factor Category  - - - - High Fall Risk  Risk for fall due to  : - - - - Impaired vision;Other (Comment)  Risk for fall due to: Comment - - - - fell over [place]  Follow up - - - Education provided;Falls prevention discussed Falls prevention discussed  Comment - - - Fell playing soccer with grand daughter -   Depression Screen PHQ 2/9 Scores 08/05/2018 08/04/2017 08/02/2016 08/30/2015  PHQ - 2 Score 0 0 0 0  PHQ- 9 Score - 0 - -    Cognitive Function MMSE - Mini Mental State Exam 08/04/2017 08/02/2016 08/03/2015  Orientation to time 5 5 5   Orientation to Place 5 5 5   Registration 3 3 3   Attention/ Calculation 5 5 5   Recall 3 3 3   Language- name 2 objects 2 2 2   Language- repeat 1 1 1   Language- follow 3 step command 3 3 3   Language- read & follow direction 1 1 1   Write a sentence 1 1 1   Copy design 1 1 1   Total score 30 30 30      6CIT Screen 08/05/2018 08/02/2016  What Year? 0 points 0 points  What month? 0 points 0 points  What time? 0 points 0 points  Count back from 20 0 points 0 points  Months in reverse 0 points 0 points    Immunization History  Administered Date(s) Administered  . Influenza Split 06/17/2014  . Influenza, High Dose Seasonal PF 08/02/2016, 06/18/2017, 08/05/2018  . Influenza,inj,Quad PF,6+ Mos 08/03/2015  . Pneumococcal Conjugate-13 06/17/2014  . Pneumococcal Polysaccharide-23 06/18/2007, 09/29/2017  . Pneumococcal-Unspecified 09/28/2006  . Tdap 06/23/2014   Screening Tests Health Maintenance  Topic Date Due  . TETANUS/TDAP  06/23/2024  . INFLUENZA VACCINE  Completed  . PNA vac Low Risk Adult  Completed       Plan:    End of life planning; Advance aging; Advanced directives discussed. Copy of current HCPOA/Living Will requested.    I have personally reviewed and noted the following in the patient's  chart:   . Medical and social history . Use of alcohol, tobacco or illicit drugs  . Current medications and supplements . Functional ability and status . Nutritional status . Physical  activity . Advanced directives . List of other physicians . Hospitalizations, surgeries, and ER visits in previous 12 months . Vitals . Screenings to include cognitive, depression, and falls . Referrals and appointments  In addition, I have reviewed and discussed with patient certain preventive protocols, quality metrics, and best practice recommendations. A written personalized care plan for preventive services as well as general preventive health recommendations were provided to patient.     OBrien-Blaney, [place] L, LPN  [id number]    I have reviewed the above information and agree with above.   Deborra Medina, MD

## 2018-09-02 ENCOUNTER — Encounter: Payer: Self-pay | Admitting: Internal Medicine

## 2018-09-02 ENCOUNTER — Ambulatory Visit (INDEPENDENT_AMBULATORY_CARE_PROVIDER_SITE_OTHER): Payer: Medicare Other | Admitting: Internal Medicine

## 2018-09-02 VITALS — BP 110/66 | HR 77 | Temp 97.6°F | Resp 16 | Ht 74.5 in | Wt 238.6 lb

## 2018-09-02 DIAGNOSIS — Z122 Encounter for screening for malignant neoplasm of respiratory organs: Secondary | ICD-10-CM | POA: Diagnosis not present

## 2018-09-02 DIAGNOSIS — I878 Other specified disorders of veins: Secondary | ICD-10-CM

## 2018-09-02 DIAGNOSIS — E786 Lipoprotein deficiency: Secondary | ICD-10-CM | POA: Diagnosis not present

## 2018-09-02 DIAGNOSIS — E785 Hyperlipidemia, unspecified: Secondary | ICD-10-CM

## 2018-09-02 DIAGNOSIS — Z79899 Other long term (current) drug therapy: Secondary | ICD-10-CM

## 2018-09-02 DIAGNOSIS — J439 Emphysema, unspecified: Secondary | ICD-10-CM

## 2018-09-02 DIAGNOSIS — H903 Sensorineural hearing loss, bilateral: Secondary | ICD-10-CM

## 2018-09-02 LAB — CBC WITH DIFFERENTIAL/PLATELET
Basophils Absolute: 0 10*3/uL (ref 0.0–0.1)
Basophils Relative: 0.2 % (ref 0.0–3.0)
EOS ABS: 0.1 10*3/uL (ref 0.0–0.7)
EOS PCT: 2 % (ref 0.0–5.0)
HEMATOCRIT: 44.8 % (ref 39.0–52.0)
Hemoglobin: 15.2 g/dL (ref 13.0–17.0)
Lymphocytes Relative: 31.7 % (ref 12.0–46.0)
Lymphs Abs: 2.3 10*3/uL (ref 0.7–4.0)
MCHC: 34 g/dL (ref 30.0–36.0)
MCV: 91.1 fl (ref 78.0–100.0)
Monocytes Absolute: 0.7 10*3/uL (ref 0.1–1.0)
Monocytes Relative: 9.2 % (ref 3.0–12.0)
Neutro Abs: 4.2 10*3/uL (ref 1.4–7.7)
Neutrophils Relative %: 56.9 % (ref 43.0–77.0)
Platelets: 184 10*3/uL (ref 150.0–400.0)
RBC: 4.92 Mil/uL (ref 4.22–5.81)
RDW: 13.5 % (ref 11.5–15.5)
WBC: 7.3 10*3/uL (ref 4.0–10.5)

## 2018-09-02 LAB — COMPREHENSIVE METABOLIC PANEL
ALT: 15 U/L (ref 0–53)
AST: 18 U/L (ref 0–37)
Albumin: 4.2 g/dL (ref 3.5–5.2)
Alkaline Phosphatase: 68 U/L (ref 39–117)
BUN: 9 mg/dL (ref 6–23)
CALCIUM: 9.4 mg/dL (ref 8.4–10.5)
CO2: 31 meq/L (ref 19–32)
CREATININE: 0.92 mg/dL (ref 0.40–1.50)
Chloride: 103 mEq/L (ref 96–112)
GFR: 84.47 mL/min (ref >60.00)
Glucose, Bld: 106 mg/dL — ABNORMAL HIGH (ref 70–99)
Potassium: 4.4 mEq/L (ref 3.5–5.1)
Sodium: 139 mEq/L (ref 135–145)
TOTAL PROTEIN: 7.4 g/dL (ref 6.0–8.3)
Total Bilirubin: 0.7 mg/dL (ref 0.2–1.2)

## 2018-09-02 LAB — LIPID PANEL
Cholesterol: 129 mg/dL (ref 0–200)
HDL: 38.3 mg/dL — AB (ref >39.00)
LDL Cholesterol: 71 mg/dL (ref 0–99)
NONHDL: 90.73
Total CHOL/HDL Ratio: 3
Triglycerides: 99 mg/dL (ref 0.0–149.0)
VLDL: 19.8 mg/dL (ref 0.0–40.0)

## 2018-09-02 LAB — TSH: TSH: 1.71 u[IU]/mL (ref 0.35–4.50)

## 2018-09-02 NOTE — Progress Notes (Signed)
Subjective:  Patient ID: Seth Perez., male    DOB: 04-14-1941  Age: 77 y.o. MRN: [id number]  CC: The primary encounter diagnosis was Hyperlipidemia with low HDL. Diagnoses of Long-term use of high-risk medication, Pulmonary emphysema, unspecified emphysema type ([place]), Encounter for screening for lung cancer, Lower extremity venous stasis, and Sensorineural hearing loss (SNHL) of both ears were also pertinent to this visit.  HPI [name] presents for follow up on hyperlipidemia  And CoPD (unacknowleldged by patient) . Last seen in January for follow up on community acquired pneumonia resulting in hospitalization for respiratory failure.     He reports that his breathing has been "OK", non labored. He is  using Incruse has 2 months left.  Cannot afford other inhalers due to cost  Of $1000 /month.  Did not call [facility] back in march    Not convinced that he has COPD despite CT evidence of emphysema and abnormal PFTs   Smoked for 60 years,  Stopped in 2014.  Taking Eliquis for history of large PE. Denies any history of excessive bleeding     Bowels lmproved after taking a round of probiotics so he is taking regularly   Chronic Bilateral  leg pain and swelling  Due to VI  Discourages him from walking .  Wearig compression stockings .  Needs new ones.    . Colonoscopy normal in 2014    Outpatient Medications Prior to Visit  Medication Sig Dispense Refill  . albuterol (VENTOLIN HFA) 108 (90 Base) MCG/ACT inhaler Inhale 2 puffs into the lungs every 6 (six) hours as needed for wheezing. Reported on 02/28/2016 3.7 g 5  . ELIQUIS 5 MG TABS tablet TAKE 1 TABLET BY MOUTH TWICE A DAY 60 tablet 2  . simvastatin (ZOCOR) 20 MG tablet TAKE 1 TABLET BY MOUTH AT BEDTIME 90 tablet 1  . umeclidinium bromide (INCRUSE ELLIPTA) 62.5 MCG/INH AEPB Inhale 1 puff into the lungs daily. 30 each 11  . vitamin B-12 (CYANOCOBALAMIN) 1000 MCG tablet Take 1,000 mcg by mouth daily. Pt takes 2 tabs daily.      Marland Kitchen VITAMIN D, CHOLECALCIFEROL, PO Take 5,000 Units by mouth daily.     No facility-administered medications prior to visit.     Review of Systems;  Patient denies headache, fevers, malaise, unintentional weight loss, skin rash, eye pain, sinus congestion and sinus pain, sore throat, dysphagia,  hemoptysis , cough, dyspnea, wheezing, chest pain, palpitations, orthopnea, edema, abdominal pain, nausea, melena, diarrhea, constipation, flank pain, dysuria, hematuria, urinary  Frequency, nocturia, numbness, tingling, seizures,  Focal weakness, Loss of consciousness,  Tremor, insomnia, depression, anxiety, and suicidal ideation.      Objective:  BP 110/66 (BP Location: Left Arm, Patient Position: Sitting, Cuff Size: Large)   Pulse 77   Temp 97.6 F (36.4 C) (Oral)   Resp 16   Ht 6' 2.5" (1.892 m)   Wt 238 lb 9.6 oz (108.2 kg)   SpO2 95%   BMI 30.22 kg/m   BP Readings from Last 3 Encounters:  09/02/18 110/66  08/05/18 110/70  09/29/17 132/74    Wt Readings from Last 3 Encounters:  09/02/18 238 lb 9.6 oz (108.2 kg)  08/05/18 239 lb 6.4 oz (108.6 kg)  09/29/17 242 lb 9.6 oz (110 kg)    General appearance: alert, cooperative and appears stated age Ears: normal TM's and external ear canals both ears Throat: lips, mucosa, and tongue normal; teeth and gums normal Neck: no adenopathy, no carotid bruit,  supple, symmetrical, trachea midline and thyroid not enlarged, symmetric, no tenderness/mass/nodules Back: symmetric, no curvature. ROM normal. No CVA tenderness. Lungs: clear to auscultation bilaterally Heart: regular rate and rhythm, S1, S2 normal, no murmur, click, rub or gallop Abdomen: soft, non-tender; bowel sounds normal; no masses,  no organomegaly Pulses: 2+ and symmetric Skin: Skin color, texture, turgor normal. No rashes or lesions Lymph nodes: Cervical, supraclavicular, and axillary nodes normal.  Lab Results  Component Value Date   HGBA1C 5.5 09/22/2017   HGBA1C 5.7  10/24/2012   HGBA1C 5.8 10/23/2012    Lab Results  Component Value Date   CREATININE 0.92 09/02/2018   CREATININE 1.05 09/22/2017   CREATININE 0.91 09/20/2017    Lab Results  Component Value Date   WBC 7.3 09/02/2018   HGB 15.2 09/02/2018   HCT 44.8 09/02/2018   PLT 184.0 09/02/2018   GLUCOSE 106 (H) 09/02/2018   CHOL 129 09/02/2018   TRIG 99.0 09/02/2018   HDL 38.30 (L) 09/02/2018   LDLCALC 71 09/02/2018   ALT 15 09/02/2018   AST 18 09/02/2018   NA 139 09/02/2018   K 4.4 09/02/2018   CL 103 09/02/2018   CREATININE 0.92 09/02/2018   BUN 9 09/02/2018   CO2 31 09/02/2018   TSH 1.71 09/02/2018   PSA 11.44 (H) 11/24/2012   INR 1.5 01/26/2014   HGBA1C 5.5 09/22/2017     Assessment & Plan:   Problem List Items Addressed This Visit    COPD (chronic obstructive pulmonary disease) with emphysema ([place])    Secondary to tobacco abuse.  Continue daily use of Incruse. Immunizations for influenza and pneumonia are up to date.  Encourage him to walk daily       Relevant Orders   CT CHEST LUNG CA SCREEN LOW DOSE W/O CM   Hard of hearing    He has deferred acquisition of hearing aids due to cost.       Hyperlipidemia with low HDL - Primary    Using the Framingham risk calculator,  his 10 year risk of coronary artery disease is 27%. Statin therapy was initiated and LDL is now < 70.  LFTS normal no changes today   Lab Results  Component Value Date   CHOL 129 09/02/2018   HDL 38.30 (L) 09/02/2018   LDLCALC 71 09/02/2018   TRIG 99.0 09/02/2018   CHOLHDL 3 09/02/2018   Lab Results  Component Value Date   ALT 15 09/02/2018   AST 18 09/02/2018   ALKPHOS 68 09/02/2018   BILITOT 0.7 09/02/2018         Relevant Orders   Lipid panel (Completed)   TSH (Completed)   Lower extremity venous stasis    Managed with compression stockings.  encouaraged to buy new stockings for greater control and to walk daily        Other Visit Diagnoses    Long-term use of high-risk  medication       Relevant Orders   Comprehensive metabolic panel (Completed)   CBC with Differential/Platelet (Completed)   Encounter for screening for lung cancer       Relevant Orders   CT CHEST LUNG CA SCREEN LOW DOSE W/O CM    A total of 25 minutes of face to face time was spent with patient more than half of which was spent in counselling about the above mentioned conditions  and coordination of care   I am having Sher L. Kaster Sr. maintain his albuterol, umeclidinium bromide, vitamin B-12, (VITAMIN  D, CHOLECALCIFEROL, PO), ELIQUIS, and simvastatin.  No orders of the defined types were placed in this encounter.   There are no discontinued medications.  Follow-up: Return in about 6 months (around 03/04/2019).   [name], MD

## 2018-09-02 NOTE — Patient Instructions (Addendum)
[place] has a clinician that can measure your legs and get you the right  Size of compression stockings  Your next colon cancer screening is not until 2024   I will make the referral to [facility] for the Lung CT

## 2018-09-04 ENCOUNTER — Telehealth: Payer: Self-pay | Admitting: *Deleted

## 2018-09-04 NOTE — Telephone Encounter (Signed)
Patient is rereferred for lung screening, attempted to call to schedule scan. There is no answer or voicemail option at # in EMR.

## 2018-09-05 ENCOUNTER — Encounter: Payer: Self-pay | Admitting: Internal Medicine

## 2018-09-05 DIAGNOSIS — H9190 Unspecified hearing loss, unspecified ear: Secondary | ICD-10-CM | POA: Insufficient documentation

## 2018-09-05 NOTE — Assessment & Plan Note (Signed)
Managed with compression stockings.  encouaraged to buy new stockings for greater control and to walk daily

## 2018-09-05 NOTE — Assessment & Plan Note (Signed)
Using the Framingham risk calculator,  his 10 year risk of coronary artery disease is 27%. Statin therapy was initiated and LDL is now < 70.  LFTS normal no changes today   Lab Results  Component Value Date   CHOL 129 09/02/2018   HDL 38.30 (L) 09/02/2018   LDLCALC 71 09/02/2018   TRIG 99.0 09/02/2018   CHOLHDL 3 09/02/2018   Lab Results  Component Value Date   ALT 15 09/02/2018   AST 18 09/02/2018   ALKPHOS 68 09/02/2018   BILITOT 0.7 09/02/2018

## 2018-09-05 NOTE — Assessment & Plan Note (Signed)
He has deferred acquisition of hearing aids due to cost.

## 2018-09-05 NOTE — Assessment & Plan Note (Signed)
Secondary to tobacco abuse.  Continue daily use of Incruse. Immunizations for influenza and pneumonia are up to date.  Encourage him to walk daily

## 2018-09-08 ENCOUNTER — Telehealth: Payer: Self-pay | Admitting: *Deleted

## 2018-09-08 NOTE — Telephone Encounter (Signed)
Called pt to discuss appt for LDCT screening this Friday morning 09/11/2018 @ 11:45am here @ [facility], voiced understanding.

## 2018-09-08 NOTE — Telephone Encounter (Signed)
Received a referral for initial lung cancer screening scan.  Contacted the patient and obtained their smoking history, former smoker 2014 59 pkyr history,   as well as answering questions related to screening process.  Patient denies signs of lung cancer such as weight loss or hemoptysis at this time.  Patient denies comorbidity that would prevent curative treatment if lung cancer were found.  Patient is scheduled for the Shared Decision Making Visit and CT scan on 05-21-16.

## 2018-09-11 ENCOUNTER — Ambulatory Visit
Admission: RE | Admit: 2018-09-11 | Discharge: 2018-09-11 | Disposition: A | Discharge status: Home / Self Care | Payer: Medicare Other | Source: Ambulatory Visit | Attending: Internal Medicine | Admitting: Internal Medicine

## 2018-09-11 DIAGNOSIS — Z122 Encounter for screening for malignant neoplasm of respiratory organs: Secondary | ICD-10-CM | POA: Insufficient documentation

## 2018-09-11 DIAGNOSIS — J439 Emphysema, unspecified: Secondary | ICD-10-CM | POA: Insufficient documentation

## 2018-09-11 DIAGNOSIS — R918 Other nonspecific abnormal finding of lung field: Secondary | ICD-10-CM | POA: Insufficient documentation

## 2018-09-11 DIAGNOSIS — Z87891 Personal history of nicotine dependence: Secondary | ICD-10-CM | POA: Diagnosis not present

## 2018-09-11 DIAGNOSIS — F17210 Nicotine dependence, cigarettes, uncomplicated: Secondary | ICD-10-CM | POA: Diagnosis not present

## 2018-09-11 DIAGNOSIS — I700 Atherosclerosis of aorta: Secondary | ICD-10-CM | POA: Insufficient documentation

## 2018-09-13 ENCOUNTER — Other Ambulatory Visit: Payer: Self-pay | Admitting: Internal Medicine

## 2018-09-13 ENCOUNTER — Encounter: Payer: Self-pay | Admitting: Internal Medicine

## 2018-09-13 MED ORDER — AZITHROMYCIN 500 MG PO TABS
500.0000 mg | ORAL_TABLET | Freq: Every day | ORAL | 0 refills | Status: DC
Start: 2018-09-13 — End: 2019-03-22

## 2018-09-14 ENCOUNTER — Ambulatory Visit: Admission: RE | Admit: 2018-09-14 | Payer: Medicare Other | Source: Ambulatory Visit

## 2018-09-14 ENCOUNTER — Telehealth: Payer: Self-pay | Admitting: *Deleted

## 2018-09-14 NOTE — Telephone Encounter (Signed)
Notified patient of LDCT lung cancer screening program results with recommendation for (*) month follow up imaging.  Also notified of incidental findings noted below and is encouraged to discuss further questions with PCP who will receive a copy of this not and/or the CT reports.  Patient verbalized understanding.    IMPRESSION: 1. Lung-RADS 3, probably benign findings. Short-term follow-up in 6 months is recommended with repeat low-dose chest CT without contrast (please use the following order, "CT CHEST LCS NODULE FOLLOW-UP W/O CM"). 2. New 4 mm posterior left upper lobe pulmonary nodule. 3. New cluster of tree-in-bud nodularity identified posterior left upper lobe. Imaging features are most suggestive of atypical infection (including MAI). 4.  Emphysema. (ICD10-J43.9) 5.  Aortic Atherosclerois (ICD10-170.0)

## 2018-09-18 ENCOUNTER — Other Ambulatory Visit: Payer: Self-pay | Admitting: Internal Medicine

## 2019-02-27 ENCOUNTER — Other Ambulatory Visit: Payer: Self-pay | Admitting: Internal Medicine

## 2019-03-02 ENCOUNTER — Other Ambulatory Visit: Payer: Self-pay

## 2019-03-02 MED ORDER — SIMVASTATIN 20 MG PO TABS: 20.0000 mg | ORAL_TABLET | Freq: Every day | ORAL | 1 refills | Status: DC

## 2019-03-05 ENCOUNTER — Ambulatory Visit: Payer: Medicare Other | Admitting: Internal Medicine

## 2019-03-11 ENCOUNTER — Telehealth: Payer: Self-pay | Admitting: *Deleted

## 2019-03-11 DIAGNOSIS — Z87891 Personal history of nicotine dependence: Secondary | ICD-10-CM

## 2019-03-11 DIAGNOSIS — R911 Solitary pulmonary nodule: Secondary | ICD-10-CM

## 2019-03-11 DIAGNOSIS — R918 Other nonspecific abnormal finding of lung field: Secondary | ICD-10-CM

## 2019-03-11 NOTE — Telephone Encounter (Signed)
Contacted and scheduled for LCS nodule f/u scan on 03/15/19 at 1015am

## 2019-03-12 DIAGNOSIS — R404 Transient alteration of awareness: Secondary | ICD-10-CM | POA: Diagnosis not present

## 2019-03-12 DIAGNOSIS — I959 Hypotension, unspecified: Secondary | ICD-10-CM | POA: Diagnosis not present

## 2019-03-12 DIAGNOSIS — R531 Weakness: Secondary | ICD-10-CM | POA: Diagnosis not present

## 2019-03-12 DIAGNOSIS — I700 Atherosclerosis of aorta: Secondary | ICD-10-CM | POA: Diagnosis not present

## 2019-03-12 DIAGNOSIS — I5032 Chronic diastolic (congestive) heart failure: Secondary | ICD-10-CM | POA: Diagnosis not present

## 2019-03-12 DIAGNOSIS — J449 Chronic obstructive pulmonary disease, unspecified: Secondary | ICD-10-CM | POA: Diagnosis not present

## 2019-03-12 DIAGNOSIS — I4891 Unspecified atrial fibrillation: Secondary | ICD-10-CM | POA: Diagnosis not present

## 2019-03-12 DIAGNOSIS — N281 Cyst of kidney, acquired: Secondary | ICD-10-CM | POA: Diagnosis not present

## 2019-03-12 DIAGNOSIS — R509 Fever, unspecified: Secondary | ICD-10-CM | POA: Diagnosis not present

## 2019-03-12 DIAGNOSIS — J9811 Atelectasis: Secondary | ICD-10-CM | POA: Diagnosis not present

## 2019-03-12 DIAGNOSIS — B349 Viral infection, unspecified: Secondary | ICD-10-CM | POA: Diagnosis not present

## 2019-03-12 DIAGNOSIS — I517 Cardiomegaly: Secondary | ICD-10-CM | POA: Diagnosis not present

## 2019-03-12 DIAGNOSIS — R109 Unspecified abdominal pain: Secondary | ICD-10-CM | POA: Diagnosis not present

## 2019-03-12 DIAGNOSIS — R6510 Systemic inflammatory response syndrome (SIRS) of non-infectious origin without acute organ dysfunction: Secondary | ICD-10-CM | POA: Diagnosis not present

## 2019-03-12 DIAGNOSIS — E872 Acidosis: Secondary | ICD-10-CM | POA: Diagnosis not present

## 2019-03-13 DIAGNOSIS — Z7901 Long term (current) use of anticoagulants: Secondary | ICD-10-CM | POA: Diagnosis not present

## 2019-03-13 DIAGNOSIS — E7800 Pure hypercholesterolemia, unspecified: Secondary | ICD-10-CM | POA: Diagnosis present

## 2019-03-13 DIAGNOSIS — J449 Chronic obstructive pulmonary disease, unspecified: Secondary | ICD-10-CM | POA: Diagnosis present

## 2019-03-13 DIAGNOSIS — I890 Lymphedema, not elsewhere classified: Secondary | ICD-10-CM | POA: Diagnosis present

## 2019-03-13 DIAGNOSIS — I959 Hypotension, unspecified: Secondary | ICD-10-CM | POA: Diagnosis present

## 2019-03-13 DIAGNOSIS — B349 Viral infection, unspecified: Secondary | ICD-10-CM | POA: Diagnosis not present

## 2019-03-13 DIAGNOSIS — I5032 Chronic diastolic (congestive) heart failure: Secondary | ICD-10-CM | POA: Diagnosis present

## 2019-03-13 DIAGNOSIS — R05 Cough: Secondary | ICD-10-CM | POA: Diagnosis not present

## 2019-03-13 DIAGNOSIS — Z86718 Personal history of other venous thrombosis and embolism: Secondary | ICD-10-CM | POA: Diagnosis not present

## 2019-03-13 DIAGNOSIS — R509 Fever, unspecified: Secondary | ICD-10-CM | POA: Diagnosis not present

## 2019-03-13 DIAGNOSIS — Z87891 Personal history of nicotine dependence: Secondary | ICD-10-CM | POA: Diagnosis not present

## 2019-03-13 DIAGNOSIS — R531 Weakness: Secondary | ICD-10-CM | POA: Diagnosis not present

## 2019-03-13 DIAGNOSIS — R600 Localized edema: Secondary | ICD-10-CM | POA: Diagnosis not present

## 2019-03-13 DIAGNOSIS — R6510 Systemic inflammatory response syndrome (SIRS) of non-infectious origin without acute organ dysfunction: Secondary | ICD-10-CM | POA: Diagnosis not present

## 2019-03-13 DIAGNOSIS — E872 Acidosis: Secondary | ICD-10-CM | POA: Diagnosis present

## 2019-03-13 DIAGNOSIS — Z86711 Personal history of pulmonary embolism: Secondary | ICD-10-CM | POA: Diagnosis not present

## 2019-03-13 DIAGNOSIS — R590 Localized enlarged lymph nodes: Secondary | ICD-10-CM | POA: Diagnosis present

## 2019-03-13 DIAGNOSIS — Z79899 Other long term (current) drug therapy: Secondary | ICD-10-CM | POA: Diagnosis not present

## 2019-03-13 DIAGNOSIS — Z20828 Contact with and (suspected) exposure to other viral communicable diseases: Secondary | ICD-10-CM | POA: Diagnosis present

## 2019-03-15 ENCOUNTER — Ambulatory Visit: Admission: RE | Admit: 2019-03-15 | Payer: Medicare Other | Source: Ambulatory Visit

## 2019-03-22 ENCOUNTER — Ambulatory Visit (INDEPENDENT_AMBULATORY_CARE_PROVIDER_SITE_OTHER): Payer: Medicare Other | Admitting: Internal Medicine

## 2019-03-22 ENCOUNTER — Other Ambulatory Visit: Payer: Self-pay

## 2019-03-22 ENCOUNTER — Encounter: Payer: Self-pay | Admitting: Internal Medicine

## 2019-03-22 ENCOUNTER — Other Ambulatory Visit: Payer: Self-pay | Admitting: Internal Medicine

## 2019-03-22 DIAGNOSIS — I2699 Other pulmonary embolism without acute cor pulmonale: Secondary | ICD-10-CM | POA: Diagnosis not present

## 2019-03-22 DIAGNOSIS — I87009 Postthrombotic syndrome without complications of unspecified extremity: Secondary | ICD-10-CM | POA: Diagnosis not present

## 2019-03-22 DIAGNOSIS — M7989 Other specified soft tissue disorders: Secondary | ICD-10-CM | POA: Diagnosis not present

## 2019-03-22 DIAGNOSIS — J439 Emphysema, unspecified: Secondary | ICD-10-CM | POA: Diagnosis not present

## 2019-03-22 DIAGNOSIS — Z09 Encounter for follow-up examination after completed treatment for conditions other than malignant neoplasm: Secondary | ICD-10-CM

## 2019-03-22 NOTE — Patient Instructions (Signed)
IT SOUND LIKE YOUR RIGHT LEG NEEDS A COMPRESSION DRESSING TO HELP REDUCE THE FLUID IN IT.  wE WILL TRY TO CONTACT [facility] TO SEND AN RN OUT    HAVE ALSO MADE AN URGENT REFERRAL TO DR Lucky Cowboy

## 2019-03-22 NOTE — Progress Notes (Signed)
Telephone Note  This visit type was conducted due to national recommendations for restrictions regarding the COVID-19 pandemic (e.g. social distancing).  This format is felt to be most appropriate for this patient at this time.  All issues noted in this document were discussed and addressed.  No physical exam was performed (except for noted visual exam findings with Video Visits).   I connected with@ on 03/22/19 at  2:30 PM EDT by telephone and verified that I am speaking with the correct person using two identifiers. Location patient: home Location provider: work or home office Persons participating in the virtual visit: patient, provider  I discussed the limitations, risks, security and privacy concerns of performing an evaluation and management service by telephone and the availability of in person appointments. I also discussed with the patient that there may be a patient responsible charge related to this service. The patient expressed understanding and agreed to proceed.  Reason for visit:   HPI:  Hospitalized at [facility] in [place]  On June 19  for sudden onset of bilateral leg weakness,   r > l . The symptoms occurred after having a normal morning.  During the ER evaluated he was noted to have a fever , was tested and found to be negative for covid 19 twice , as well as for  flu etc.  All negative  .  Had mediastinal lymphadenopathy by CT   Details of hospitalization were recalled by patiemt to the best of hs ability .   He missed his appointment for LCS nodule follow up scan because he was hospitalized.   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  Developed hypokalemia   Right leg still more swollen than the  left  And has blistering of the mid foot   He was offered rehab at discharge   DEVELOPED  skin reaction to the tape used to hold IV and was prescribed triamcinolone cream to use tid   Walking around now with a walker ,  wrappiNg legs with compression wrap,  Still has swelling on midfoot with blisterrng.  Has ben wrapping it since    NEEDS TO SEE DEW FOR FOLLOW UP.    Elevated vitamin D in Jan 2019 no follow up done  ,  Emphysema: Patient  no showed for his 6 month follow up lung CT to evaluate the new 4 mm nodule in the LUL (atypical infection including MAI suggested)   Past Medical History:  Diagnosis Date  . Arthritis   . Chicken pox   . COPD (chronic obstructive pulmonary disease) ([place])   . Pulmonary emboli ([place])    on Xarelto    Past Surgical History:  Procedure Laterality Date  . CHOLECYSTECTOMY  2008  . TONSILLECTOMY AND ADENOIDECTOMY  1947    Family History  Problem Relation Age of Onset  . Arthritis Mother   . Lung cancer Brother        was a smoker  . Breast cancer Maternal Grandmother   . Heart disease Maternal Grandfather   . Asthma Father   . Emphysema Father   . Heart disease Paternal Grandfather     SOCIAL HX:  reports that he quit smoking about 6 years ago. His smoking use included cigarettes. He has a 59.00 pack-year smoking history. He has never used smokeless tobacco.  He reports current alcohol use of about 2.0 standard drinks of alcohol per week. He reports that he does not use drugs.  Current Outpatient Medications:  .  albuterol (VENTOLIN HFA) 108 (90 Base) MCG/ACT inhaler, Inhale 2 puffs into the lungs every 6 (six) hours as needed for wheezing. Reported on 02/28/2016, Disp: 3.7 g, Rfl: 5 .  ELIQUIS 5 MG TABS tablet, TAKE 1 TABLET BY MOUTH TWICE A DAY, Disp: 180 tablet, Rfl: 1 .  simvastatin (ZOCOR) 20 MG tablet, Take 1 tablet (20 mg total) by mouth at bedtime., Disp: 90 tablet, Rfl: 1 .  umeclidinium bromide (INCRUSE ELLIPTA) 62.5 MCG/INH AEPB, Inhale 1 puff into the lungs daily., Disp: 30 each, Rfl: 11 .  vitamin B-12 (CYANOCOBALAMIN)  1000 MCG tablet, Take 1,000 mcg by mouth daily. Pt takes 2 tabs daily., Disp: , Rfl:  .  VITAMIN D, CHOLECALCIFEROL, PO, Take 5,000 Units by mouth daily., Disp: , Rfl:   EXAM:   General impression: alert, cooperative and articulate.  No signs of being in distress  Lungs: speech is fluent sentence length suggests that patient is not short of breath and not punctuated by cough, sneezing or sniffing. Marland Kitchen   Psych: affect normal.  speech is articulate and non pressured .  Denies suicidal thoughts   ASSESSMENT AND PLAN:  Discussed the following assessment and plan:  Swelling of limb RIGHT LEG.  suspect lymphedema   Referral to vascular surgery fo revaluation and management   Hospital discharge follow-up Patient is stable post discharge and has no new issues or questions about discharge plans at the visit today for hospital follow up. 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  Hypercoag workup was reportedly done. Continue Life long anticoagulation      I discussed the assessment and treatment plan with the patient. The patient was provided an opportunity to ask questions and all were answered. The patient agreed with the plan and demonstrated an understanding of the instructions.   The patient was advised to call back or seek an in-person evaluation if the symptoms worsen or if the condition fails to improve as anticipated.  I provided 25 minutes of non-face-to-face time during this encounter.   [name], MD

## 2019-03-22 NOTE — Assessment & Plan Note (Addendum)
RIGHT LEG.  suspect lymphedema   Referral to vascular surgery fo revaluation and management

## 2019-03-23 NOTE — Assessment & Plan Note (Signed)
unclear if his current edema is due to interval development of lymphedema .  Referring to [facility] for evaluation

## 2019-03-23 NOTE — Assessment & Plan Note (Signed)
There was no lower extremity source in either leg by concurrent  Ultrasound .  Hypercoag workup was reportedly done. Continue Life long anticoagulation

## 2019-03-23 NOTE — Assessment & Plan Note (Signed)
Patient is stable post discharge and has no new issues or questions about discharge plans at the visit today for hospital follow up. All labs , imaging studies and progress notes from admission were reviewed with patient today   

## 2019-03-24 ENCOUNTER — Telehealth: Payer: Self-pay | Admitting: Internal Medicine

## 2019-03-24 NOTE — Telephone Encounter (Signed)
I was aware of the blister from the visit   Tell mr L not to pop it , just keep it covered.

## 2019-03-24 NOTE — Telephone Encounter (Signed)
Pt significant other janice is calling to let dr Derrel Nip know patient has blister on top of right foot. Pt right leg the swelling has went down. Please advise. [facility] Head[facility] is not on DPR. [facility]. Pt had a tele vist with md on 03-22-2019

## 2019-03-25 NOTE — Telephone Encounter (Signed)
Patient says the spot has popped on right foot but is not red or oozing other smaller blister have not popped , advised of PCP order and that if drainage appeared or redness developed and was hot to touch would need to be evaluated over the weekend at a UC.

## 2019-03-29 ENCOUNTER — Other Ambulatory Visit: Payer: Self-pay

## 2019-03-29 ENCOUNTER — Ambulatory Visit: Payer: Medicare Other | Admitting: Internal Medicine

## 2019-03-30 ENCOUNTER — Other Ambulatory Visit: Payer: Self-pay

## 2019-03-30 ENCOUNTER — Encounter (INDEPENDENT_AMBULATORY_CARE_PROVIDER_SITE_OTHER): Payer: Self-pay | Admitting: Vascular Surgery

## 2019-03-30 ENCOUNTER — Ambulatory Visit (INDEPENDENT_AMBULATORY_CARE_PROVIDER_SITE_OTHER): Payer: Medicare Other | Admitting: Vascular Surgery

## 2019-03-30 ENCOUNTER — Ambulatory Visit: Payer: Medicare Other | Admitting: Family Medicine

## 2019-03-30 VITALS — BP 118/78 | HR 112 | Resp 14 | Ht 75.0 in | Wt 242.0 lb

## 2019-03-30 DIAGNOSIS — J432 Centrilobular emphysema: Secondary | ICD-10-CM | POA: Diagnosis not present

## 2019-03-30 DIAGNOSIS — I87009 Postthrombotic syndrome without complications of unspecified extremity: Secondary | ICD-10-CM

## 2019-03-30 DIAGNOSIS — L97211 Non-pressure chronic ulcer of right calf limited to breakdown of skin: Secondary | ICD-10-CM | POA: Diagnosis not present

## 2019-03-30 DIAGNOSIS — E785 Hyperlipidemia, unspecified: Secondary | ICD-10-CM | POA: Diagnosis not present

## 2019-03-30 DIAGNOSIS — Z0289 Encounter for other administrative examinations: Secondary | ICD-10-CM

## 2019-03-30 DIAGNOSIS — E786 Lipoprotein deficiency: Secondary | ICD-10-CM | POA: Diagnosis not present

## 2019-03-30 DIAGNOSIS — M7989 Other specified soft tissue disorders: Secondary | ICD-10-CM

## 2019-03-30 NOTE — Assessment & Plan Note (Signed)
lipid control important in reducing the progression of atherosclerotic disease. Continue statin therapy  

## 2019-03-30 NOTE — Assessment & Plan Note (Signed)
A 3 layer Unna boot was placed today and will be changed weekly.  I would expect this will need 3 to 4 weeks to get the skin completely healed and to get the swelling under better control.  He has had a negative DVT study, but if he has persistent ulceration or swelling beyond a few weeks I think a repeat reflux study would be of benefit.  I suspect this is just postphlebitic damage to his deep venous system at this point.  We will see him back in 3 to 4 weeks to assess his response to weekly Unna boots and determine what further measures need to be done.  An Unna boot would be reasonable on the left leg as well, but for now we will continue compression stockings as long as the control remains decent.

## 2019-03-30 NOTE — Progress Notes (Signed)
Patient ID: Seth KAIL Sr., male   DOB: [birth year], 78 y.o.   MRN: [id number]  Chief Complaint  Patient presents with  . Follow-up    HPI Seth QUINTANAR Sr. is a 78 y.o. male.  I am asked to see the patient by Dr. Derrel Nip for evaluation of leg swelling, pain and ulceration.  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  He has had compression stockings but apparently about 2 weeks ago he had a pronounced worsening of his right leg swelling to the point where there was a large blisters, redness, and pain.  He was in the hospital for 4 days.  He had an ultrasound which did not show a new DVT.  Elevation antibiotics got his swelling and redness somewhat improved but his right leg still has open ulcerations that is draining clear serous fluid.  His left leg has chronic swelling and skin discoloration without open ulceration or infection.  No current fever or chills.  No chest pain or shortness of breath.  There is no clear inciting event or causative factor that started the symptoms.  Elevation and compression have helped the symptoms somewhat but he has persistent ulceration and swelling on the right leg.     Past Medical History:  Diagnosis Date  . Arthritis   . Chicken pox   . COPD (chronic obstructive pulmonary disease) ([place])   . Pulmonary emboli ([place])    on Xarelto    Past Surgical History:  Procedure Laterality Date  . CHOLECYSTECTOMY  2008  . TONSILLECTOMY AND ADENOIDECTOMY  1947    Family History Family History  Problem Relation Age of Onset  . Arthritis Mother   . Lung cancer Brother        was a smoker  . Breast cancer Maternal Grandmother   . Heart disease Maternal Grandfather   . Asthma Father   . Emphysema Father   . Heart disease Paternal Grandfather     Social History Social History   Tobacco Use  . Smoking status: Former Smoker    Packs/day: 1.00    Years: 59.00    Pack years: 59.00    Types: Cigarettes    Quit date:  10/12/2012    Years since quitting: 6.4  . Smokeless tobacco: Never Used  Substance Use Topics  . Alcohol use: Yes    Alcohol/week: 2.0 standard drinks    Types: 2 Standard drinks or equivalent per week  . Drug use: No    No Known Allergies  Current Outpatient Medications  Medication Sig Dispense Refill  . albuterol (VENTOLIN HFA) 108 (90 Base) MCG/ACT inhaler Inhale 2 puffs into the lungs every 6 (six) hours as needed for wheezing. Reported on 02/28/2016 3.7 g 5  . ELIQUIS 5 MG TABS tablet TAKE 1 TABLET BY MOUTH TWICE A DAY 180 tablet 1  . simvastatin (ZOCOR) 20 MG tablet Take 1 tablet (20 mg total) by mouth at bedtime. 90 tablet 1  . umeclidinium bromide (INCRUSE ELLIPTA) 62.5 MCG/INH AEPB Inhale 1 puff into the lungs daily. 30 each 11  . vitamin B-12 (CYANOCOBALAMIN) 1000 MCG tablet Take 1,000 mcg by mouth daily. Pt takes 2 tabs daily.    Marland Kitchen VITAMIN D, CHOLECALCIFEROL, PO Take 5,000 Units by mouth daily.     No current facility-administered medications for this visit.       REVIEW OF SYSTEMS (Negative unless checked)  Constitutional: [] Weight loss  [] Fever  [] Chills Cardiac: [] Chest  pain   [] Chest pressure   [] Palpitations   [] Shortness of breath when laying flat   [] Shortness of breath at rest   [x] Shortness of breath with exertion. Vascular:  [] Pain in legs with walking   [] Pain in legs at rest   [] Pain in legs when laying flat   [] Claudication   [] Pain in feet when walking  [] Pain in feet at rest  [] Pain in feet when laying flat   [x] History of DVT   [x] Phlebitis   [x] Swelling in legs   [] Varicose veins   [x] Non-healing ulcers Pulmonary:   [] Uses home oxygen   [] Productive cough   [] Hemoptysis   [] Wheeze  [x] COPD   [] Asthma Neurologic:  [] Dizziness  [] Blackouts   [] Seizures   [] History of stroke   [] History of TIA  [] Aphasia   [] Temporary blindness   [] Dysphagia   [] Weakness or numbness in arms   [] Weakness or numbness in legs Musculoskeletal:  [x] Arthritis   [] Joint swelling    [] Joint pain   [] Low back pain Hematologic:  [] Easy bruising  [] Easy bleeding   [] Hypercoagulable state   [] Anemic  [] Hepatitis Gastrointestinal:  [] Blood in stool   [] Vomiting blood  [] Gastroesophageal reflux/heartburn   [] Abdominal pain Genitourinary:  [] Chronic kidney disease   [] Difficult urination  [] Frequent urination  [] Burning with urination   [] Hematuria Skin:  [] Rashes   [x] Ulcers   [x] Wounds Psychological:  [] History of anxiety   []  History of major depression.    Physical Exam BP 118/78 (BP Location: Left Arm, Patient Position: Sitting, Cuff Size: Large)   Pulse (!) 112   Resp 14   Ht 6\' 3"  (1.905 m)   Wt 242 lb (109.8 kg)   BMI 30.25 kg/m  Gen:  WD/WN, NAD.  Appears younger than stated age Head: [place]/AT, No temporalis wasting. Ear/Nose/Throat: Hearing grossly intact, nares w/o erythema or drainage, oropharynx w/o Erythema/Exudate Eyes: Conjunctiva clear, sclera non-icteric  Neck: trachea midline.  No JVD.  Pulmonary:  Good air movement, respirations not labored, no use of accessory muscles  Cardiac: Tachycardic and somewhat irregular Vascular:  Vessel Right Left  Radial Palpable Palpable                          PT NP NP  DP 1+ 1+   Gastrointestinal:. No masses, surgical incisions, or scars. Musculoskeletal: M/S 5/5 throughout.  Extremities without ischemic changes.  No deformity or atrophy.  2-3+ right lower extremity edema, 1-2+ left lower extremity edema.  Moderate stasis dermatitis changes present on the left with moderate to severe stasis dermatitis changes present on the right.  Several small superficial ulcerations are present on the right leg/calf.  The largest one is in the medial/anterior calf area measuring 3 to 4 cm. Neurologic: Sensation grossly intact in extremities.  Symmetrical.  Speech is fluent. Motor exam as listed above. Psychiatric: Judgment intact, Mood & affect appropriate for pt's clinical situation. Dermatologic: right leg wounds as above.     Radiology No results found.  Labs No results found for this or any previous visit (from the past 2160 hour(s)).  Assessment/Plan:  Hyperlipidemia with low HDL lipid control important in reducing the progression of atherosclerotic disease. Continue statin therapy   COPD (chronic obstructive pulmonary disease) with emphysema (HCC) Continue pulmonary medications and aerosols as already ordered, these medications have been reviewed and there are no changes at this time.    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  Once we get  his wounds healed, he will need continuous compression stockings, elevation, and activity as tolerated  Swelling of limb Pretty significant.  An Unna boot will help control this on the right he should continue to wear compression stocking on the left.  Lower limb ulcer, calf, right, limited to breakdown of skin (HCC) A 3 layer Unna boot was placed today and will be changed weekly.  I would expect this will need 3 to 4 weeks to get the skin completely healed and to get the swelling under better control.  He has had a negative DVT study, but if he has persistent ulceration or swelling beyond a few weeks I think a repeat reflux study would be of benefit.  I suspect this is just postphlebitic damage to his deep venous system at this point.  We will see him back in 3 to 4 weeks to assess his response to weekly Unna boots and determine what further measures need to be done.  An Unna boot would be reasonable on the left leg as well, but for now we will continue compression stockings as long as the control remains decent.      Leotis Pain 03/30/2019, 9:56 AM   This note was created with Dragon medical transcription system.  Any errors from dictation are unintentional.

## 2019-03-30 NOTE — Patient Instructions (Signed)

## 2019-03-30 NOTE — Assessment & Plan Note (Signed)
This is clearly an underlying issue with his legs.  Once we get his wounds healed, he will need continuous compression stockings, elevation, and activity as tolerated

## 2019-03-30 NOTE — Assessment & Plan Note (Signed)
Pretty significant.  An Unna boot will help control this on the right he should continue to wear compression stocking on the left.

## 2019-03-30 NOTE — Assessment & Plan Note (Signed)
Continue pulmonary medications and aerosols as already ordered, these medications have been reviewed and there are no changes at this time.   

## 2019-04-05 ENCOUNTER — Telehealth: Payer: Self-pay | Admitting: *Deleted

## 2019-04-05 NOTE — Telephone Encounter (Signed)
Left voicemail in attempt to reschedule LCS nodule follow up scan. 

## 2019-04-06 ENCOUNTER — Encounter (INDEPENDENT_AMBULATORY_CARE_PROVIDER_SITE_OTHER): Payer: Medicare Other

## 2019-04-07 ENCOUNTER — Ambulatory Visit (INDEPENDENT_AMBULATORY_CARE_PROVIDER_SITE_OTHER): Payer: Medicare Other | Admitting: Nurse Practitioner

## 2019-04-07 ENCOUNTER — Other Ambulatory Visit: Payer: Self-pay

## 2019-04-07 VITALS — BP 126/85 | HR 109 | Resp 16 | Ht 76.0 in | Wt 247.0 lb

## 2019-04-07 DIAGNOSIS — L97211 Non-pressure chronic ulcer of right calf limited to breakdown of skin: Secondary | ICD-10-CM | POA: Diagnosis not present

## 2019-04-07 NOTE — Progress Notes (Signed)
History of Present Illness  There is no documented history at this time  Assessments & Plan   There are no diagnoses linked to this encounter.    Additional instructions  Subjective:  Patient presents with venous ulcer of the Right lower extremity.    Procedure:  3 layer unna wrap was placed Right lower extremity.   Plan:   Follow up in one week.   

## 2019-04-10 ENCOUNTER — Encounter (INDEPENDENT_AMBULATORY_CARE_PROVIDER_SITE_OTHER): Payer: Self-pay | Admitting: Nurse Practitioner

## 2019-04-13 ENCOUNTER — Encounter (INDEPENDENT_AMBULATORY_CARE_PROVIDER_SITE_OTHER): Payer: Self-pay

## 2019-04-13 ENCOUNTER — Other Ambulatory Visit: Payer: Self-pay

## 2019-04-13 ENCOUNTER — Ambulatory Visit (INDEPENDENT_AMBULATORY_CARE_PROVIDER_SITE_OTHER): Payer: Medicare Other | Admitting: Vascular Surgery

## 2019-04-13 VITALS — BP 137/80 | HR 72 | Resp 16 | Ht 76.0 in | Wt 246.2 lb

## 2019-04-13 DIAGNOSIS — M7989 Other specified soft tissue disorders: Secondary | ICD-10-CM

## 2019-04-13 DIAGNOSIS — L97929 Non-pressure chronic ulcer of unspecified part of left lower leg with unspecified severity: Secondary | ICD-10-CM

## 2019-04-13 DIAGNOSIS — L97211 Non-pressure chronic ulcer of right calf limited to breakdown of skin: Secondary | ICD-10-CM | POA: Diagnosis not present

## 2019-04-13 NOTE — Progress Notes (Signed)
History of Present Illness  There is no documented history at this time  Assessments & Plan   There are no diagnoses linked to this encounter.    Additional instructions  Subjective:  Patient presents with venous ulcer of the Bilateral lower extremity.    Procedure:  3 layer unna wrap was placed Bilateral lower extremity.   Plan:   Follow up in one week.  

## 2019-04-20 ENCOUNTER — Other Ambulatory Visit: Payer: Self-pay

## 2019-04-20 ENCOUNTER — Encounter (INDEPENDENT_AMBULATORY_CARE_PROVIDER_SITE_OTHER): Payer: Self-pay | Admitting: Nurse Practitioner

## 2019-04-20 ENCOUNTER — Ambulatory Visit (INDEPENDENT_AMBULATORY_CARE_PROVIDER_SITE_OTHER): Payer: Medicare Other | Admitting: Nurse Practitioner

## 2019-04-20 VITALS — BP 132/75 | HR 90 | Resp 16 | Wt 238.0 lb

## 2019-04-20 DIAGNOSIS — E786 Lipoprotein deficiency: Secondary | ICD-10-CM | POA: Diagnosis not present

## 2019-04-20 DIAGNOSIS — J432 Centrilobular emphysema: Secondary | ICD-10-CM

## 2019-04-20 DIAGNOSIS — L97211 Non-pressure chronic ulcer of right calf limited to breakdown of skin: Secondary | ICD-10-CM | POA: Diagnosis not present

## 2019-04-20 DIAGNOSIS — E785 Hyperlipidemia, unspecified: Secondary | ICD-10-CM | POA: Diagnosis not present

## 2019-04-20 NOTE — Progress Notes (Signed)
SUBJECTIVE:  Patient ID: Seth Perez., male    DOB: [birth year], 78 y.o.   MRN: [id number] Chief Complaint  Patient presents with  . Follow-up    unna check    HPI  Seth IKARD Sr. is a 78 y.o. male that presents today for evaluation of his bilateral lower extremities following Unna wrap therapy.  Today the patient's right lower extremity has his ulcerations are mostly healed however the swelling is still not quite controlled.  The patient's right lower extremity had a new blister and ulceration occurring the previous week.  Today the ulceration is still present but very shallow at this time.  The patient is not weeping with no signs and symptoms of cellulitis.  He denies any fever, chills, nausea, vomiting or diarrhea.  He is tolerating the Unna wraps well.  Past Medical History:  Diagnosis Date  . Arthritis   . Chicken pox   . COPD (chronic obstructive pulmonary disease) (Seth Perez)   . Pulmonary emboli (Seth Perez)    on Xarelto    Past Surgical History:  Procedure Laterality Date  . CHOLECYSTECTOMY  2008  . TONSILLECTOMY AND ADENOIDECTOMY  1947    Social History   Socioeconomic History  . Marital status: Divorced    Spouse name: Not on file  . Number of children: Not on file  . Years of education: Not on file  . Highest education level: Not on file  Occupational History  . Not on file  Social Needs  . Financial resource strain: Patient refused  . Food insecurity    Worry: Patient refused    Inability: Patient refused  . Transportation needs    Medical: Patient refused    Non-medical: Patient refused  Tobacco Use  . Smoking status: Former Smoker    Packs/day: 1.00    Years: 59.00    Pack years: 59.00    Types: Cigarettes    Quit date: 10/12/2012    Years since quitting: 6.5  . Smokeless tobacco: Never Used  Substance and Sexual Activity  . Alcohol use: Yes    Alcohol/week: 2.0 standard drinks    Types: 2 Standard drinks or equivalent per week  . Drug  use: No  . Sexual activity: Not Currently  Lifestyle  . Physical activity    Days per week: Patient refused    Minutes per session: Patient refused  . Stress: Patient refused  Relationships  . Social Herbalist on phone: Patient refused    Gets together: Patient refused    Attends religious service: Patient refused    Active member of club or organization: Patient refused    Attends meetings of clubs or organizations: Patient refused    Relationship status: Patient refused  . Intimate partner violence    Fear of current or ex partner: Patient refused    Emotionally abused: Patient refused    Physically abused: Patient refused    Forced sexual activity: Patient refused  Other Topics Concern  . Not on file  Social History Narrative  . Not on file    Family History  Problem Relation Age of Onset  . Arthritis Mother   . Lung cancer Brother        was a smoker  . Breast cancer Maternal Grandmother   . Heart disease Maternal Grandfather   . Asthma Father   . Emphysema Father   . Heart disease Paternal Grandfather     No Known Allergies   Review of Systems  Review of Systems: Negative Unless Checked Constitutional: [] Weight loss  [] Fever  [] Chills Cardiac: [] Chest pain   []  Atrial Fibrillation  [] Palpitations   [] Shortness of breath when laying flat   [] Shortness of breath with exertion. [] Shortness of breath at rest Vascular:  [] Pain in legs with walking   [] Pain in legs with standing [] Pain in legs when laying flat   [] Claudication    [] Pain in feet when laying flat    [] History of DVT   [] Phlebitis   [x] Swelling in legs   [x] Varicose veins   [] Non-healing ulcers Pulmonary:   [] Uses home oxygen   [] Productive cough   [] Hemoptysis   [] Wheeze  [x] COPD   [] Asthma Neurologic:  [] Dizziness   [] Seizures  [] Blackouts [] History of stroke   [] History of TIA  [] Aphasia   [] Temporary Blindness   [] Weakness or numbness in arm   [] Weakness or numbness in leg Musculoskeletal:    [] Joint swelling   [] Joint pain   [] Low back pain  []  History of Knee Replacement [x] Arthritis [] back Surgeries  []  Spinal Stenosis    Hematologic:  [] Easy bruising  [] Easy bleeding   [] Hypercoagulable state   [] Anemic Gastrointestinal:  [] Diarrhea   [] Vomiting  [] Gastroesophageal reflux/heartburn   [] Difficulty swallowing. [] Abdominal pain Genitourinary:  [] Chronic kidney disease   [] Difficult urination  [] Anuric   [] Blood in urine [] Frequent urination  [] Burning with urination   [] Hematuria Skin:  [] Rashes   [] Ulcers [] Wounds Psychological:  [] History of anxiety   []  History of major depression  []  Memory Difficulties      OBJECTIVE:   Physical Exam  BP 132/75 (BP Location: Right Arm)   Pulse 90   Resp 16   Wt 238 lb (108 kg)   BMI 28.97 kg/m   Gen: WD/WN, NAD Head: [place]/AT, No temporalis wasting.  Ear/Nose/Throat: Hard of hearing, nares w/o erythema or drainage Eyes: PER, EOMI, sclera nonicteric.  Neck: Supple, no masses.  No JVD.  Pulmonary:  Good air movement, no use of accessory muscles.  Cardiac: RRR Vascular:  2+ edema bilaterally Vessel Right Left  Radial Palpable Palpable   Gastrointestinal: soft, non-distended. No guarding/no peritoneal signs.  Musculoskeletal: M/S 5/5 throughout.  No deformity or atrophy.  Neurologic: Pain and light touch intact in extremities.  Symmetrical.  Speech is fluent. Motor exam as listed above. Psychiatric: Judgment intact, Mood & affect appropriate for pt's clinical situation. Dermatologic:  Bilateral stasis dermatitis.  Small ulcer on right foot and left calf No changes consistent with cellulitis. Lymph : No Cervical lymphadenopathy, no lichenification or skin changes of chronic lymphedema.       ASSESSMENT AND PLAN:  1. Lower limb ulcer, calf, right, limited to breakdown of skin ([place]) No surgery or intervention at this point in time.    I have had a long discussion with the patient regarding venous insufficiency and why it  causes  symptoms, specifically venous ulceration . I have discussed with the patient the chronic skin changes that accompany venous insufficiency and the long term sequela such as infection and recurring  ulceration.  Patient will be placed in [employer] which will be changed weekly drainage permitting.  In addition, behavioral modification including several periods of elevation of the lower extremities during the day will be continued. Achieving a position with the ankles at heart level was stressed to the patient  The patient is instructed to begin routine exercise, especially walking on a daily basis  We will have the patient back in 4 weeks for evaluation once  again.   2. Centrilobular emphysema ([place]) Continue pulmonary medications and aerosols as already ordered, these medications have been reviewed and there are no changes at this time.    3. Hyperlipidemia with low HDL Continue statin as ordered and reviewed, no changes at this time    Current Outpatient Medications on File Prior to Visit  Medication Sig Dispense Refill  . albuterol (VENTOLIN HFA) 108 (90 Base) MCG/ACT inhaler Inhale 2 puffs into the lungs every 6 (six) hours as needed for wheezing. Reported on 02/28/2016 3.7 g 5  . ELIQUIS 5 MG TABS tablet TAKE 1 TABLET BY MOUTH TWICE A DAY 180 tablet 1  . simvastatin (ZOCOR) 20 MG tablet Take 1 tablet (20 mg total) by mouth at bedtime. 90 tablet 1  . umeclidinium bromide (INCRUSE ELLIPTA) 62.5 MCG/INH AEPB Inhale 1 puff into the lungs daily. 30 each 11  . vitamin B-12 (CYANOCOBALAMIN) 1000 MCG tablet Take 1,000 mcg by mouth daily. Pt takes 2 tabs daily.    Marland Kitchen VITAMIN D, CHOLECALCIFEROL, PO Take 5,000 Units by mouth daily.     No current facility-administered medications on file prior to visit.     There are no Patient Instructions on file for this visit. Return in about 1 week (around 04/27/2019).   Kris Hartmann, NP  This note was completed with Sales executive.  Any errors are  purely unintentional.

## 2019-04-28 ENCOUNTER — Encounter (INDEPENDENT_AMBULATORY_CARE_PROVIDER_SITE_OTHER): Payer: Self-pay | Admitting: Nurse Practitioner

## 2019-04-28 ENCOUNTER — Ambulatory Visit (INDEPENDENT_AMBULATORY_CARE_PROVIDER_SITE_OTHER): Payer: Medicare Other | Admitting: Nurse Practitioner

## 2019-04-28 ENCOUNTER — Encounter (INDEPENDENT_AMBULATORY_CARE_PROVIDER_SITE_OTHER): Payer: Self-pay

## 2019-04-28 ENCOUNTER — Other Ambulatory Visit: Payer: Self-pay

## 2019-04-28 VITALS — BP 138/82 | HR 70 | Resp 17 | Ht 73.0 in | Wt 238.0 lb

## 2019-04-28 DIAGNOSIS — L97211 Non-pressure chronic ulcer of right calf limited to breakdown of skin: Secondary | ICD-10-CM | POA: Diagnosis not present

## 2019-04-28 NOTE — Progress Notes (Signed)
History of Present Illness  There is no documented history at this time  Assessments & Plan   There are no diagnoses linked to this encounter.    Additional instructions  Subjective:  Patient presents with venous ulcer of the Bilateral lower extremity.    Procedure:  3 layer unna wrap was placed Bilateral lower extremity.   Plan:   Follow up in one week.  

## 2019-04-29 ENCOUNTER — Telehealth: Payer: Self-pay | Admitting: *Deleted

## 2019-04-29 NOTE — Telephone Encounter (Signed)
Left message for patient to notify them that it is time to schedule low dose lung cancer screening CT scan. Instructed patient to call back to verify information prior to the scan being scheduled.  

## 2019-05-05 ENCOUNTER — Encounter (INDEPENDENT_AMBULATORY_CARE_PROVIDER_SITE_OTHER): Payer: Self-pay

## 2019-05-05 ENCOUNTER — Ambulatory Visit (INDEPENDENT_AMBULATORY_CARE_PROVIDER_SITE_OTHER): Payer: Medicare Other | Admitting: Nurse Practitioner

## 2019-05-05 ENCOUNTER — Other Ambulatory Visit: Payer: Self-pay

## 2019-05-05 VITALS — BP 137/76 | HR 80 | Resp 16 | Ht 73.0 in | Wt 233.0 lb

## 2019-05-05 DIAGNOSIS — L97211 Non-pressure chronic ulcer of right calf limited to breakdown of skin: Secondary | ICD-10-CM | POA: Diagnosis not present

## 2019-05-05 NOTE — Progress Notes (Signed)
History of Present Illness  There is no documented history at this time  Assessments & Plan   There are no diagnoses linked to this encounter.    Additional instructions  Subjective:  Patient presents with venous ulcer of the Bilateral lower extremity.    Procedure:  3 layer unna wrap was placed Bilateral lower extremity.   Plan:   Follow up in one week.  

## 2019-05-10 ENCOUNTER — Encounter (INDEPENDENT_AMBULATORY_CARE_PROVIDER_SITE_OTHER): Payer: Self-pay | Admitting: Nurse Practitioner

## 2019-05-12 ENCOUNTER — Other Ambulatory Visit: Payer: Self-pay

## 2019-05-12 ENCOUNTER — Ambulatory Visit (INDEPENDENT_AMBULATORY_CARE_PROVIDER_SITE_OTHER): Payer: Medicare Other | Admitting: Nurse Practitioner

## 2019-05-12 VITALS — BP 132/74 | HR 64 | Resp 12 | Ht 76.0 in | Wt 234.0 lb

## 2019-05-12 DIAGNOSIS — L97211 Non-pressure chronic ulcer of right calf limited to breakdown of skin: Secondary | ICD-10-CM

## 2019-05-12 NOTE — Progress Notes (Signed)
History of Present Illness  There is no documented history at this time  Assessments & Plan   There are no diagnoses linked to this encounter.    Additional instructions  Subjective:  Patient presents with venous ulcer of the Bilateral lower extremity.    Procedure:  3 layer unna wrap was placed Bilateral lower extremity.   Plan:   Follow up in one week.  

## 2019-05-14 ENCOUNTER — Telehealth: Payer: Self-pay | Admitting: *Deleted

## 2019-05-14 DIAGNOSIS — Z87891 Personal history of nicotine dependence: Secondary | ICD-10-CM

## 2019-05-14 DIAGNOSIS — R918 Other nonspecific abnormal finding of lung field: Secondary | ICD-10-CM

## 2019-05-14 DIAGNOSIS — Z122 Encounter for screening for malignant neoplasm of respiratory organs: Secondary | ICD-10-CM

## 2019-05-14 NOTE — Telephone Encounter (Signed)
Patient is scheduled for his lcs nodule follow up scan

## 2019-05-19 ENCOUNTER — Ambulatory Visit (INDEPENDENT_AMBULATORY_CARE_PROVIDER_SITE_OTHER): Payer: Medicare Other | Admitting: Nurse Practitioner

## 2019-05-19 ENCOUNTER — Ambulatory Visit
Admission: RE | Admit: 2019-05-19 | Discharge: 2019-05-19 | Disposition: A | Discharge status: Home / Self Care | Payer: Medicare Other | Source: Ambulatory Visit | Attending: Oncology | Admitting: Oncology

## 2019-05-19 ENCOUNTER — Encounter (INDEPENDENT_AMBULATORY_CARE_PROVIDER_SITE_OTHER): Payer: Self-pay | Admitting: Nurse Practitioner

## 2019-05-19 ENCOUNTER — Other Ambulatory Visit: Payer: Self-pay

## 2019-05-19 VITALS — BP 148/62 | HR 74 | Resp 10 | Ht 76.0 in | Wt 234.0 lb

## 2019-05-19 DIAGNOSIS — I2510 Atherosclerotic heart disease of native coronary artery without angina pectoris: Secondary | ICD-10-CM | POA: Diagnosis not present

## 2019-05-19 DIAGNOSIS — I700 Atherosclerosis of aorta: Secondary | ICD-10-CM | POA: Diagnosis not present

## 2019-05-19 DIAGNOSIS — Z87891 Personal history of nicotine dependence: Secondary | ICD-10-CM | POA: Insufficient documentation

## 2019-05-19 DIAGNOSIS — I2699 Other pulmonary embolism without acute cor pulmonale: Secondary | ICD-10-CM | POA: Diagnosis not present

## 2019-05-19 DIAGNOSIS — R911 Solitary pulmonary nodule: Secondary | ICD-10-CM | POA: Diagnosis not present

## 2019-05-19 DIAGNOSIS — Z122 Encounter for screening for malignant neoplasm of respiratory organs: Secondary | ICD-10-CM | POA: Insufficient documentation

## 2019-05-19 DIAGNOSIS — E785 Hyperlipidemia, unspecified: Secondary | ICD-10-CM | POA: Diagnosis not present

## 2019-05-19 DIAGNOSIS — E786 Lipoprotein deficiency: Secondary | ICD-10-CM

## 2019-05-19 DIAGNOSIS — L97211 Non-pressure chronic ulcer of right calf limited to breakdown of skin: Secondary | ICD-10-CM

## 2019-05-19 DIAGNOSIS — R918 Other nonspecific abnormal finding of lung field: Secondary | ICD-10-CM | POA: Diagnosis not present

## 2019-05-19 NOTE — Progress Notes (Signed)
FYI

## 2019-05-19 NOTE — Progress Notes (Signed)
SUBJECTIVE:  Patient ID: Seth Roche., male    DOB: 05-23-1941, 78 y.o.   MRN: [id number] Chief Complaint  Patient presents with  . Follow-up    HPI  Seth YOUSIF Sr. is a 78 y.o. male that presents today for evaluation of his lower extremity swelling as well as ulceration on his right leg.  The wound is completely healed and the swelling is much more under control.  The patient denies any fever, chills, nausea, vomiting or diarrhea.  Denies any major issues with wearing [place]na wraps.  Overall patient states his lower extremities are doing and feeling well.  Past Medical History:  Diagnosis Date  . Arthritis   . Chicken pox   . COPD (chronic obstructive pulmonary disease) ([place])   . Pulmonary emboli ([place])    on Xarelto    Past Surgical History:  Procedure Laterality Date  . CHOLECYSTECTOMY  2008  . TONSILLECTOMY AND ADENOIDECTOMY  1947    Social History   Socioeconomic History  . Marital status: Divorced    Spouse name: Not on file  . Number of children: Not on file  . Years of education: Not on file  . Highest education level: Not on file  Occupational History  . Not on file  Social Needs  . Financial resource strain: Patient refused  . Food insecurity    Worry: Patient refused    Inability: Patient refused  . Transportation needs    Medical: Patient refused    Non-medical: Patient refused  Tobacco Use  . Smoking status: Former Smoker    Packs/day: 1.00    Years: 59.00    Pack years: 59.00    Types: Cigarettes    Quit date: 10/12/2012    Years since quitting: 6.6  . Smokeless tobacco: Never Used  Substance and Sexual Activity  . Alcohol use: Yes    Alcohol/week: 2.0 standard drinks    Types: 2 Standard drinks or equivalent per week  . Drug use: No  . Sexual activity: Not Currently  Lifestyle  . Physical activity    Days per week: Patient refused    Minutes per session: Patient refused  . Stress: Patient refused  Relationships  . Social  Herbalist on phone: Patient refused    Gets together: Patient refused    Attends religious service: Patient refused    Active member of club or organization: Patient refused    Attends meetings of clubs or organizations: Patient refused    Relationship status: Patient refused  . Intimate partner violence    Fear of current or ex partner: Patient refused    Emotionally abused: Patient refused    Physically abused: Patient refused    Forced sexual activity: Patient refused  Other Topics Concern  . Not on file  Social History Narrative  . Not on file    Family History  Problem Relation Age of Onset  . Arthritis Mother   . Lung cancer Brother        was a smoker  . Breast cancer Maternal Grandmother   . Heart disease Maternal Grandfather   . Asthma Father   . Emphysema Father   . Heart disease Paternal Grandfather     No Known Allergies   Review of Systems   Review of Systems: Negative Unless Checked Constitutional: [] Weight loss  [] Fever  [] Chills Cardiac: [] Chest pain   []  Atrial Fibrillation  [] Palpitations   [] Shortness of breath when laying flat   [] Shortness  of breath with exertion. [] Shortness of breath at rest Vascular:  [] Pain in legs with walking   [] Pain in legs with standing [] Pain in legs when laying flat   [] Claudication    [] Pain in feet when laying flat    [] History of DVT   [] Phlebitis   [x] Swelling in legs   [] Varicose veins   [] Non-healing ulcers Pulmonary:   [] Uses home oxygen   [] Productive cough   [] Hemoptysis   [] Wheeze  [x] COPD   [] Asthma Neurologic:  [] Dizziness   [] Seizures  [] Blackouts [] History of stroke   [] History of TIA  [] Aphasia   [] Temporary Blindness   [] Weakness or numbness in arm   [] Weakness or numbness in leg Musculoskeletal:   [] Joint swelling   [] Joint pain   [] Low back pain  []  History of Knee Replacement [x] Arthritis [] back Surgeries  []  Spinal Stenosis    Hematologic:  [] Easy bruising  [] Easy bleeding   [] Hypercoagulable  state   [] Anemic Gastrointestinal:  [] Diarrhea   [] Vomiting  [] Gastroesophageal reflux/heartburn   [] Difficulty swallowing. [] Abdominal pain Genitourinary:  [] Chronic kidney disease   [] Difficult urination  [] Anuric   [] Blood in urine [] Frequent urination  [] Burning with urination   [] Hematuria Skin:  [] Rashes   [] Ulcers [] Wounds Psychological:  [] History of anxiety   []  History of major depression  []  Memory Difficulties      OBJECTIVE:   Physical Exam  BP (!) 148/62 (BP Location: Left Arm, Patient Position: Sitting, Cuff Size: Normal)   Pulse 74   Resp 10   Ht 6\' 4"  (1.93 m)   Wt 234 lb (106.1 kg)   BMI 28.48 kg/m   Gen: WD/WN, NAD Head: [place]/AT, No temporalis wasting.  Ear/Nose/Throat: Hearing grossly intact, nares w/o erythema or drainage Eyes: PER, EOMI, sclera nonicteric.  Neck: Supple, no masses.  No JVD.  Pulmonary:  Good air movement, no use of accessory muscles.  Cardiac: RRR Vascular:  2+ soft edema bilaterally Vessel Right Left  Radial Palpable Palpable  Dorsalis Pedis Palpable Palpable   Gastrointestinal: soft, non-distended. No guarding/no peritoneal signs.  Musculoskeletal: M/S 5/5 throughout.  No deformity or atrophy.  Neurologic: Pain and light touch intact in extremities.  Symmetrical.  Speech is fluent. Motor exam as listed above. Psychiatric: Judgment intact, Mood & affect appropriate for pt's clinical situation. Dermatologic: No Venous rashes. No Ulcers Noted.  No changes consistent with cellulitis. Lymph : No Cervical lymphadenopathy, no lichenification or skin changes of chronic lymphedema.       ASSESSMENT AND PLAN:  1. Lower limb ulcer, calf, right, limited to breakdown of skin (HCC) Ulcerations all healed.  Swelling much better.  We will have the patient follow-up in December with his his normally scheduled ultrasound.  Patient advised to contact office sooner if he begins to have swelling not controlled with compression socks, weeping or  ulcerations.  The patient is also reminded to use medical grade 1 compression stockings on a daily basis.  He should also  2. Recurrent pulmonary embolism ([place]) Patient has had multiple pulmonary embolisms and currently has an IVC filter in place.  He currently denies any issues at this time.  IVC to remain in place.  3. Hyperlipidemia with low HDL Continue statin as ordered and reviewed, no changes at this time    Current Outpatient Medications on File Prior to Visit  Medication Sig Dispense Refill  . albuterol (VENTOLIN HFA) 108 (90 Base) MCG/ACT inhaler Inhale 2 puffs into the lungs every 6 (six) hours as needed for wheezing. Reported  on 02/28/2016 3.7 g 5  . ELIQUIS 5 MG TABS tablet TAKE 1 TABLET BY MOUTH TWICE A DAY 180 tablet 1  . simvastatin (ZOCOR) 20 MG tablet Take 1 tablet (20 mg total) by mouth at bedtime. 90 tablet 1  . umeclidinium bromide (INCRUSE ELLIPTA) 62.5 MCG/INH AEPB Inhale 1 puff into the lungs daily. 30 each 11  . vitamin B-12 (CYANOCOBALAMIN) 1000 MCG tablet Take 1,000 mcg by mouth daily. Pt takes 2 tabs daily.    Marland Kitchen VITAMIN D, CHOLECALCIFEROL, PO Take 5,000 Units by mouth daily.     No current facility-administered medications on file prior to visit.     There are no Patient Instructions on file for this visit. No follow-ups on file.   Kris Hartmann, NP  This note was completed with Sales executive.  Any errors are purely unintentional.

## 2019-05-20 ENCOUNTER — Telehealth: Payer: Self-pay | Admitting: *Deleted

## 2019-05-20 DIAGNOSIS — R918 Other nonspecific abnormal finding of lung field: Secondary | ICD-10-CM

## 2019-05-20 NOTE — Telephone Encounter (Signed)
FOLLOW UP CT ORDERED FOR LATE November,  NEEDS APPT TO DISCUSS

## 2019-05-20 NOTE — Addendum Note (Signed)
Addended by: [name] on: 05/20/2019 12:53 PM   Modules accepted: Orders

## 2019-05-20 NOTE — Telephone Encounter (Signed)
Notified patient of LDCT lung cancer screening program results with recommendation for 3 month follow up imaging. Also notified of incidental findings noted below and is encouraged to discuss further with PCP who will receive a copy of this note and/or the CT report. Patient verbalizes understanding.   IMPRESSION: 1. Enlarging left upper lobe nodule. Lung-RADS 4A, suspicious. Follow up low-dose chest CT without contrast in 3 months (please use the following order, "CT CHEST LCS NODULE FOLLOW-UP W/O CM") is recommended. Alternatively, PET may be considered when there is a solid component 64mm or larger. These results will be called to the ordering clinician or representative by the Radiologist Assistant, and communication documented in the PACS or zVision Dashboard. 2. Aortic atherosclerosis (ICD10-170.0). Coronary artery calcification. 3. Enlarged pulmonic trunk, indicative of pulmonary arterial hypertension.

## 2019-05-20 NOTE — Telephone Encounter (Signed)
I would prefer to see him ASAP

## 2019-05-20 NOTE — Telephone Encounter (Signed)
Pt has an appt scheduled to see you on 08/11/2019 should pt be seen sooner?

## 2019-05-21 NOTE — Telephone Encounter (Signed)
LMTCB

## 2019-05-24 NOTE — Telephone Encounter (Signed)
LMTCB

## 2019-05-25 NOTE — Telephone Encounter (Signed)
LMTCB

## 2019-06-02 NOTE — Telephone Encounter (Signed)
Sent an unable to reach letter to patient asking him to please give our office a call.

## 2019-08-05 ENCOUNTER — Telehealth: Payer: Self-pay

## 2019-08-05 ENCOUNTER — Telehealth: Payer: Self-pay | Admitting: Internal Medicine

## 2019-08-05 NOTE — Telephone Encounter (Signed)
Pt would like to know if Dr. Derrel Nip ever received hospital records from [place]?  Pt does not think his appt with Dr. Derrel Nip will be beneficial if she does not have those records.

## 2019-08-05 NOTE — Telephone Encounter (Signed)
Medical records have been faxed in from [facility] , ED and hospital visit . Documents are up front in [employer]. Santiago Glad is aware.

## 2019-08-05 NOTE — Telephone Encounter (Signed)
NO I HAVE NOT RECEIVED THE RECORDS PLEASE HANDLE Seth Perez

## 2019-08-05 NOTE — Telephone Encounter (Signed)
Copied from [place] [id number]. Topic: General - Other >> Aug 05, 2019  2:24 PM Antonieta Iba C wrote: Reason for CRM: pt says that he is returning [place] call.   Please assist pt.

## 2019-08-11 ENCOUNTER — Ambulatory Visit (INDEPENDENT_AMBULATORY_CARE_PROVIDER_SITE_OTHER): Payer: Medicare Other | Admitting: Internal Medicine

## 2019-08-11 ENCOUNTER — Encounter: Payer: Self-pay | Admitting: Internal Medicine

## 2019-08-11 ENCOUNTER — Other Ambulatory Visit: Payer: Self-pay

## 2019-08-11 ENCOUNTER — Ambulatory Visit (INDEPENDENT_AMBULATORY_CARE_PROVIDER_SITE_OTHER): Payer: Medicare Other

## 2019-08-11 VITALS — HR 75 | Ht 76.0 in | Wt 234.0 lb

## 2019-08-11 DIAGNOSIS — I878 Other specified disorders of veins: Secondary | ICD-10-CM | POA: Diagnosis not present

## 2019-08-11 DIAGNOSIS — E559 Vitamin D deficiency, unspecified: Secondary | ICD-10-CM | POA: Diagnosis not present

## 2019-08-11 DIAGNOSIS — E786 Lipoprotein deficiency: Secondary | ICD-10-CM

## 2019-08-11 DIAGNOSIS — E538 Deficiency of other specified B group vitamins: Secondary | ICD-10-CM

## 2019-08-11 DIAGNOSIS — E785 Hyperlipidemia, unspecified: Secondary | ICD-10-CM

## 2019-08-11 DIAGNOSIS — Z0000 Encounter for general adult medical examination without abnormal findings: Secondary | ICD-10-CM

## 2019-08-11 DIAGNOSIS — J432 Centrilobular emphysema: Secondary | ICD-10-CM

## 2019-08-11 DIAGNOSIS — E673 Hypervitaminosis D: Secondary | ICD-10-CM

## 2019-08-11 DIAGNOSIS — R918 Other nonspecific abnormal finding of lung field: Secondary | ICD-10-CM

## 2019-08-11 NOTE — Progress Notes (Signed)
Telephone   Note  This visit type was conducted due to national recommendations for restrictions regarding the COVID-19 pandemic (e.g. social distancing).  This format is felt to be most appropriate for this patient at this time.  All issues noted in this document were discussed and addressed.  No physical exam was performed (except for noted visual exam findings with Video Visits).   I connected with@ on 08/11/19 at 10:30 AM EST by  telephone and verified that I am speaking with the correct person using two identifiers. Location patient: home Location provider: work or home office Persons participating in the virtual visit: patient, provider  I discussed the limitations, risks, security and privacy concerns of performing an evaluation and management service by telephone and the availability of in person appointments. I also discussed with the patient that there may be a patient responsible charge related to this service. The patient expressed understanding and agreed to proceed.   Reason for visit: follow up   HPI:  Patient requested a review of his hospitalization in  June .  Had episode of severe bilateral leg weakness after working in the yard,  . Did not feel overheated,  Just tired.Fell asleep ,,  Woke up and Could not get out of  [place]air despite assistance from family . No pain just weakness .  Then Taken to [facility] by EMS , had a CT scan of brain  and [place] of right leg to rule out DVT due to progressive swelling  Now attributed to lymphedema.  Sent to [facility] for management of lymphedema and venous ulcers. He is wearing compression stockings daily but not pumping.. ulcers have resolved.   More recently Pulmonary nodules enlarging.  Due for 3 month follow up,  Needs new pulmonologist since [facility] changed positions.  Discussed referral to Dr.   Patsey Berthold    No orthostatic episodes  COPD:  02 96 to 98% on room air. . Does not wear oxygen unless he is sick    ROS: See pertinent  positives and negatives per HPI.  Past Medical History:  Diagnosis Date  . Arthritis   . Chicken pox   . COPD (chronic obstructive pulmonary disease) ([place])   . Pulmonary emboli ([place])    on Xarelto    Past Surgical History:  Procedure Laterality Date  . CHOLECYSTECTOMY  2008  . TONSILLECTOMY AND ADENOIDECTOMY  1947    Family History  Problem Relation Age of Onset  . Arthritis Mother   . Lung cancer Brother        was a smoker  . Breast cancer Maternal Grandmother   . Heart disease Maternal Grandfather   . Asthma Father   . Emphysema Father   . Heart disease Paternal Grandfather     SOCIAL HX:  reports that he quit smoking about 6 years ago. His smoking use included cigarettes. He has a 59.00 pack-year smoking history. He has never used smokeless tobacco. He reports current alcohol use of about 2.0 standard drinks of alcohol per week. He reports that he does not use drugs.   Current Outpatient Medications:  .  albuterol (VENTOLIN HFA) 108 (90 Base) MCG/ACT inhaler, Inhale 2 puffs into the lungs every 6 (six) hours as needed for wheezing. Reported on 02/28/2016, Disp: 3.7 g, Rfl: 5 .  ELIQUIS 5 MG TABS tablet, TAKE 1 TABLET BY MOUTH TWICE A DAY, Disp: 180 tablet, Rfl: 1 .  simvastatin (ZOCOR) 20 MG tablet, Take 1 tablet (20 mg total) by mouth at bedtime.,  Disp: 90 tablet, Rfl: 1 .  umeclidinium bromide (INCRUSE ELLIPTA) 62.5 MCG/INH AEPB, Inhale 1 puff into the lungs daily., Disp: 30 each, Rfl: 11 .  vitamin B-12 (CYANOCOBALAMIN) 1000 MCG tablet, Take 1,000 mcg by mouth daily. Pt takes 2 tabs daily., Disp: , Rfl:  .  VITAMIN D, CHOLECALCIFEROL, PO, Take 5,000 Units by mouth daily., Disp: , Rfl:   EXAM:   General impression: alert, cooperative and articulate.  No signs of being in distress  Lungs: speech is fluent sentence length suggests that patient is not short of breath and not punctuated by cough, sneezing or sniffing. Marland Kitchen   Psych: affect normal.  speech is articulate and  non pressured .  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  Continue daily use of Incruse. Immunizations for influenza and pneumonia are up to date.  Encourage him to walk daily .  Referring to Derrill Kay.   Lower extremity venous stasis complicated by development of lymphedema .  Managed by [facility]   Hypervitaminosis D Last level was high one year ago and supplements were stopped.  Repeat level is due   Multiple pulmonary nodules determined by computed tomography of lung Progressive enlargement noted.  Referral to pulmonology for consideration of bronchoscopy    I discussed the assessment and treatment plan with the patient. The patient was provided an opportunity to ask questions and all were answered. The patient agreed with the plan and demonstrated an understanding of the instructions.   The patient was advised to call back or seek an in-person evaluation if the symptoms worsen or if the condition fails to improve as anticipated.  I provided  25 minutes of non-face-to-face time during this encounter reviewing patient's current problems and post surgeries.  Providing counseling on the above mentioned problems , and coordination  of care .   [name], MD

## 2019-08-11 NOTE — Patient Instructions (Addendum)
  Seth Perez , Thank you for taking time to come for your Medicare Wellness Visit. I appreciate your ongoing commitment to your health goals. Please review the following plan we discussed and let me know if I can assist you in the future.   These are the goals we discussed: Goals      Patient Stated   . Increase physical activity (pt-stated)     Walk more for exercise       This is a list of the screening recommended for you and due dates:  Health Maintenance  Topic Date Due  . Flu Shot  12/22/2019*  . Tetanus Vaccine  06/23/2024  . Pneumonia vaccines  Completed  *Topic was postponed. The date shown is not the original due date.

## 2019-08-11 NOTE — Progress Notes (Addendum)
Subjective:   Seth LAROCCA Sr. is a 78 y.o. male who presents for Medicare Annual/Subsequent preventive examination.  Review of Systems:  No ROS.  Medicare Wellness Virtual Visit.  Visual/audio telehealth visit, UTA vital signs.   See social history for additional risk factors.   Cardiac Risk Factors include: advanced age (>19men, >42 women);male gender     Objective:    Vitals: There were no vitals taken for this visit.  There is no height or weight on file to calculate BMI.  Advanced Directives 08/05/2018 09/19/2017 09/19/2017 08/04/2017 08/27/2016 08/02/2016 08/03/2015  Does Patient Have a Medical Advance Directive? Yes Yes No Yes Yes Yes Yes  Type of Advance Directive Living will;Healthcare Power of [place];Living will Living will  Does patient want to make changes to medical advance directive? No - Patient declined No - Patient declined - No - Patient declined - - No - Patient declined  Copy of [place] in Chart? No - copy requested No - copy requested - No - copy requested - No - copy requested No - copy requested    Tobacco Social History   Tobacco Use  Smoking Status Former Smoker  . Packs/day: 1.00  . Years: 59.00  . Pack years: 59.00  . Types: Cigarettes  . Quit date: 10/12/2012  . Years since quitting: 6.8  Smokeless Tobacco Never Used     Counseling given: Not Answered   Clinical Intake:  Pre-visit preparation completed: Yes        Diabetes: No  How often do you need to have someone help you when you read instructions, pamphlets, or other written materials from your doctor or pharmacy?: 1 - Never  Interpreter Needed?: No     Past Medical History:  Diagnosis Date  . Arthritis   . Chicken pox   . COPD (chronic obstructive pulmonary disease) ([place])   . Pulmonary emboli ([place])    on Xarelto   Past  Surgical History:  Procedure Laterality Date  . CHOLECYSTECTOMY  2008  . TONSILLECTOMY AND ADENOIDECTOMY  1947   Family History  Problem Relation Age of Onset  . Arthritis Mother   . Lung cancer Brother        was a smoker  . Breast cancer Maternal Grandmother   . Heart disease Maternal Grandfather   . Asthma Father   . Emphysema Father   . Heart disease Paternal Grandfather    Social History   Socioeconomic History  . Marital status: Divorced    Spouse name: Not on file  . Number of children: Not on file  . Years of education: Not on file  . Highest education level: Not on file  Occupational History  . Not on file  Social Needs  . Financial resource strain: Patient refused  . Food insecurity    Worry: Patient refused    Inability: Patient refused  . Transportation needs    Medical: Patient refused    Non-medical: Patient refused  Tobacco Use  . Smoking status: Former Smoker    Packs/day: 1.00    Years: 59.00    Pack years: 59.00    Types: Cigarettes    Quit date: 10/12/2012    Years since quitting: 6.8  . Smokeless tobacco: Never Used  Substance and Sexual Activity  . Alcohol use: Yes    Alcohol/week: 2.0 standard drinks    Types: 2 Standard drinks  or equivalent per week  . Drug use: No  . Sexual activity: Not Currently  Lifestyle  . Physical activity    Days per week: Patient refused    Minutes per session: Patient refused  . Stress: Patient refused  Relationships  . Social Herbalist on phone: Patient refused    Gets together: Patient refused    Attends religious service: Patient refused    Active member of club or organization: Patient refused    Attends meetings of clubs or organizations: Patient refused    Relationship status: Patient refused  Other Topics Concern  . Not on file  Social History Narrative  . Not on file    Outpatient Encounter Medications as of 08/11/2019  Medication Sig  . albuterol (VENTOLIN HFA) 108 (90 Base)  MCG/ACT inhaler Inhale 2 puffs into the lungs every 6 (six) hours as needed for wheezing. Reported on 02/28/2016  . ELIQUIS 5 MG TABS tablet TAKE 1 TABLET BY MOUTH TWICE A DAY  . simvastatin (ZOCOR) 20 MG tablet Take 1 tablet (20 mg total) by mouth at bedtime.  Marland Kitchen umeclidinium bromide (INCRUSE ELLIPTA) 62.5 MCG/INH AEPB Inhale 1 puff into the lungs daily.  . vitamin B-12 (CYANOCOBALAMIN) 1000 MCG tablet Take 1,000 mcg by mouth daily. Pt takes 2 tabs daily.  Marland Kitchen VITAMIN D, CHOLECALCIFEROL, PO Take 5,000 Units by mouth daily.   No facility-administered encounter medications on file as of 08/11/2019.     Activities of Daily Living In your present state of health, do you have any difficulty performing the following activities: 08/11/2019  Hearing? Y  Comment Hearing aid  Vision? N  Difficulty concentrating or making decisions? N  Walking or climbing stairs? N  Dressing or bathing? N  Doing errands, shopping? N  Preparing Food and eating ? N  Using the Toilet? N  In the past six months, have you accidently leaked urine? N  Do you have problems with loss of bowel control? N  Managing your Medications? N  Managing your Finances? N  Housekeeping or managing your Housekeeping? N  Some recent data might be hidden    Patient Care Team: [name], MD as PCP - General (Internal Medicine)   Assessment:   This is a routine wellness examination for Seth Perez.  Nurse connected with patient 08/11/19 at 10:00 AM EST by a telephone enabled telemedicine application and verified that I am speaking with the correct person using two identifiers. Patient stated full name and DOB. Patient gave permission to continue with virtual visit. Patient's location was at home and Nurse's location was at [place] office.   Health Maintenance Due: -Influenza vaccine 2020- discussed; to be completed in season with doctor or local pharmacy.   Update all pending maintenance due as appropriate.   See completed HM at the  end of note.   Eye: Visual acuity not assessed. Virtual visit.   Dental: UTD  Hearing: Hearing aids- yes  Safety:  Patient feels safe at home- yes Patient does have smoke detectors at home- yes Patient does wear sunscreen or protective clothing when in direct sunlight - yes Patient does wear seat belt when in a moving vehicle - yes Patient drives- yes Adequate lighting in walkways free from debris- yes Grab bars and handrails used as appropriate- yes Ambulates with a cane at times as an assistive device Cell phone on person when ambulating outside of the home- yes  Social: Alcohol intake - yes      Smoking history-  former   Smokers in home? none Illicit drug use? none  Depression: PHQ 2 &9 complete. See screening below. Denies irritability, anhedonia, sadness/tearfullness.    Falls: See screening below.    Medication: Taking as directed and without issues.   Covid-19: Precautions and sickness symptoms discussed. Wears mask, social distancing, hand hygiene as appropriate.   Activities of Daily Living Patient denies needing assistance with: household chores, feeding themselves, getting from bed to chair, getting to the toilet, bathing/showering, dressing, managing money, or preparing meals.   Memory: Patient is alert. Patient denies difficulty focusing or concentrating. Correctly identified the president of the [place], season and recall. Patient likes to read for brain stimulation.   BMI- discussed the importance of a healthy diet, water intake and the benefits of aerobic exercise.  Educational material provided.  Physical activity- no routine  Diet:  Regular Water: poor Caffeine: drinks 8-10 cups  Other Providers Patient Care Team: [name], MD as PCP - General (Internal Medicine)   Exercise Activities and Dietary recommendations Current Exercise Habits: The patient does not participate in regular exercise at present  Goals      Patient Stated    . Increase physical activity (pt-stated)     Walk more for exercise        Fall Risk Fall Risk  08/11/2019 08/11/2019 08/05/2018 08/04/2017 08/30/2016  Falls in the past year? 0 0 0 No Yes  Comment - - - - Emmi Telephone Survey: data to providers prior to load  Number falls in past yr: - 0 - - 1  Comment - - - - Emmi Telephone Survey Actual Response = 1  Injury with Fall? - - - - No  Comment - - - - -  Risk Factor Category  - - - - -  Risk for fall due to : - - - - -  Risk for fall due to: Comment - - - - -  Follow up Falls evaluation completed Falls prevention discussed;Education provided - - -  Comment - - - - -   Timed Get Up and Go Performed: no, virtual visit  Depression Screen PHQ 2/9 Scores 08/05/2018 08/04/2017 08/02/2016 08/30/2015  PHQ - 2 Score 0 0 0 0  PHQ- 9 Score - 0 - -    Cognitive Function MMSE - Mini Mental State Exam 08/04/2017 08/02/2016 08/03/2015  Orientation to time 5 5 5   Orientation to Place 5 5 5   Registration 3 3 3   Attention/ Calculation 5 5 5   Recall 3 3 3   Language- name 2 objects 2 2 2   Language- repeat 1 1 1   Language- follow 3 step command 3 3 3   Language- read & follow direction 1 1 1   Write a sentence 1 1 1   Copy design 1 1 1   Total score 30 30 30      6CIT Screen 08/11/2019 08/05/2018 08/02/2016  What Year? 0 points 0 points 0 points  What month? 0 points 0 points 0 points  What time? 0 points 0 points 0 points  Count back from 20 0 points 0 points 0 points  Months in reverse 0 points 0 points 0 points    Immunization History  Administered Date(s) Administered  . Influenza Split 06/17/2014  . Influenza, High Dose Seasonal PF 08/02/2016, 06/18/2017, 08/05/2018  . Influenza,inj,Quad PF,6+ Mos 08/03/2015  . Pneumococcal Conjugate-13 06/17/2014  . Pneumococcal Polysaccharide-23 06/18/2007, 09/29/2017  . Pneumococcal-Unspecified 09/28/2006  . Tdap 06/23/2014   Screening Tests Health Maintenance  Topic Date  Due  . INFLUENZA  VACCINE  12/22/2019 (Originally 04/24/2019)  . TETANUS/TDAP  06/23/2024  . PNA vac Low Risk Adult  Completed      Plan:   Keep all routine maintenance appointments.   Follow up with your doctor today @ 10:30  Medicare Attestation I have personally reviewed: The patient's medical and social history Their use of alcohol, tobacco or illicit drugs Their current medications and supplements The patient's functional ability including ADLs,fall risks, home safety risks, cognitive, and hearing and visual impairment Diet and physical activities Evidence for depression   In addition, I have reviewed and discussed with patient certain preventive protocols, quality metrics, and best practice recommendations. A written personalized care plan for preventive services as well as general preventive health recommendations were provided to patient via mail.     Seth Perez, Seth Yuhas L, LPN  [id number]   I have reviewed the above information and agree with above.   Deborra Medina, MD

## 2019-08-12 ENCOUNTER — Telehealth: Payer: Self-pay | Admitting: *Deleted

## 2019-08-12 DIAGNOSIS — Z122 Encounter for screening for malignant neoplasm of respiratory organs: Secondary | ICD-10-CM

## 2019-08-12 DIAGNOSIS — R918 Other nonspecific abnormal finding of lung field: Secondary | ICD-10-CM

## 2019-08-12 DIAGNOSIS — Z87891 Personal history of nicotine dependence: Secondary | ICD-10-CM

## 2019-08-12 NOTE — Telephone Encounter (Signed)
Patient is contacted and scheduled for LCS Nodule follow up CT scan

## 2019-08-12 NOTE — Assessment & Plan Note (Signed)
Secondary to tobacco abuse.  Continue daily use of Incruse. Immunizations for influenza and pneumonia are up to date.  Encourage him to walk daily .  Referring to Derrill Kay.

## 2019-08-12 NOTE — Assessment & Plan Note (Signed)
Last level was high one year ago and supplements were stopped.  Repeat level is due

## 2019-08-12 NOTE — Assessment & Plan Note (Signed)
complicated by development of lymphedema .  Managed by [facility]

## 2019-08-12 NOTE — Assessment & Plan Note (Signed)
Progressive enlargement noted.  Referral to pulmonology for consideration of bronchoscopy

## 2019-08-13 ENCOUNTER — Ambulatory Visit (INDEPENDENT_AMBULATORY_CARE_PROVIDER_SITE_OTHER): Payer: Medicare Other

## 2019-08-13 ENCOUNTER — Other Ambulatory Visit: Payer: Self-pay

## 2019-08-13 ENCOUNTER — Other Ambulatory Visit (INDEPENDENT_AMBULATORY_CARE_PROVIDER_SITE_OTHER): Payer: Medicare Other

## 2019-08-13 DIAGNOSIS — E538 Deficiency of other specified B group vitamins: Secondary | ICD-10-CM

## 2019-08-13 DIAGNOSIS — E786 Lipoprotein deficiency: Secondary | ICD-10-CM

## 2019-08-13 DIAGNOSIS — Z23 Encounter for immunization: Secondary | ICD-10-CM

## 2019-08-13 DIAGNOSIS — E785 Hyperlipidemia, unspecified: Secondary | ICD-10-CM | POA: Diagnosis not present

## 2019-08-13 DIAGNOSIS — E559 Vitamin D deficiency, unspecified: Secondary | ICD-10-CM

## 2019-08-13 LAB — COMPREHENSIVE METABOLIC PANEL
ALT: 17 U/L (ref 0–53)
AST: 18 U/L (ref 0–37)
Albumin: 4.1 g/dL (ref 3.5–5.2)
Alkaline Phosphatase: 64 U/L (ref 39–117)
BUN: 14 mg/dL (ref 6–23)
CO2: 27 mEq/L (ref 19–32)
Calcium: 9.1 mg/dL (ref 8.4–10.5)
Chloride: 104 mEq/L (ref 96–112)
Creatinine, Ser: 0.82 mg/dL (ref 0.40–1.50)
GFR: 90.54 mL/min (ref >60.00)
Glucose, Bld: 91 mg/dL (ref 70–99)
Potassium: 4.3 mEq/L (ref 3.5–5.1)
Sodium: 140 mEq/L (ref 135–145)
Total Bilirubin: 0.9 mg/dL (ref 0.2–1.2)
Total Protein: 6.8 g/dL (ref 6.0–8.3)

## 2019-08-13 LAB — LIPID PANEL
Cholesterol: 136 mg/dL (ref 0–200)
HDL: 39.4 mg/dL (ref >39.00)
LDL Cholesterol: 74 mg/dL (ref 0–99)
NonHDL: 96.64
Total CHOL/HDL Ratio: 3
Triglycerides: 115 mg/dL (ref 0.0–149.0)
VLDL: 23 mg/dL (ref 0.0–40.0)

## 2019-08-13 LAB — CBC WITH DIFFERENTIAL/PLATELET
Basophils Absolute: 0 10*3/uL (ref 0.0–0.1)
Basophils Relative: 0.5 % (ref 0.0–3.0)
Eosinophils Absolute: 0.1 10*3/uL (ref 0.0–0.7)
Eosinophils Relative: 1.6 % (ref 0.0–5.0)
HCT: 44.6 % (ref 39.0–52.0)
Hemoglobin: 15 g/dL (ref 13.0–17.0)
Lymphocytes Relative: 39.7 % (ref 12.0–46.0)
Lymphs Abs: 3.1 10*3/uL (ref 0.7–4.0)
MCHC: 33.5 g/dL (ref 30.0–36.0)
MCV: 91.9 fl (ref 78.0–100.0)
Monocytes Absolute: 0.7 10*3/uL (ref 0.1–1.0)
Monocytes Relative: 8.6 % (ref 3.0–12.0)
Neutro Abs: 3.8 10*3/uL (ref 1.4–7.7)
Neutrophils Relative %: 49.6 % (ref 43.0–77.0)
Platelets: 154 10*3/uL (ref 150.0–400.0)
RBC: 4.86 Mil/uL (ref 4.22–5.81)
RDW: 13.1 % (ref 11.5–15.5)
WBC: 7.7 10*3/uL (ref 4.0–10.5)

## 2019-08-13 LAB — VITAMIN D 25 HYDROXY (VIT D DEFICIENCY, FRACTURES): VITD: 53.04 ng/mL (ref 30.00–100.00)

## 2019-08-13 LAB — VITAMIN B12: Vitamin B-12: 241 pg/mL (ref 211–911)

## 2019-08-16 ENCOUNTER — Emergency Department: Payer: Medicare Other

## 2019-08-16 ENCOUNTER — Observation Stay
Admission: EM | Admit: 2019-08-16 | Discharge: 2019-08-17 | Disposition: A | Discharge status: Home / Self Care | Payer: Medicare Other | Attending: Internal Medicine | Admitting: Internal Medicine

## 2019-08-16 ENCOUNTER — Encounter: Payer: Self-pay | Admitting: *Deleted

## 2019-08-16 ENCOUNTER — Other Ambulatory Visit: Payer: Self-pay

## 2019-08-16 DIAGNOSIS — E663 Overweight: Secondary | ICD-10-CM | POA: Diagnosis not present

## 2019-08-16 DIAGNOSIS — Z79899 Other long term (current) drug therapy: Secondary | ICD-10-CM | POA: Insufficient documentation

## 2019-08-16 DIAGNOSIS — Z87891 Personal history of nicotine dependence: Secondary | ICD-10-CM | POA: Insufficient documentation

## 2019-08-16 DIAGNOSIS — E785 Hyperlipidemia, unspecified: Secondary | ICD-10-CM | POA: Insufficient documentation

## 2019-08-16 DIAGNOSIS — J449 Chronic obstructive pulmonary disease, unspecified: Secondary | ICD-10-CM | POA: Diagnosis not present

## 2019-08-16 DIAGNOSIS — Z20828 Contact with and (suspected) exposure to other viral communicable diseases: Secondary | ICD-10-CM | POA: Insufficient documentation

## 2019-08-16 DIAGNOSIS — J439 Emphysema, unspecified: Secondary | ICD-10-CM | POA: Diagnosis present

## 2019-08-16 DIAGNOSIS — E538 Deficiency of other specified B group vitamins: Secondary | ICD-10-CM | POA: Insufficient documentation

## 2019-08-16 DIAGNOSIS — E786 Lipoprotein deficiency: Secondary | ICD-10-CM | POA: Diagnosis present

## 2019-08-16 DIAGNOSIS — Z7901 Long term (current) use of anticoagulants: Secondary | ICD-10-CM | POA: Diagnosis not present

## 2019-08-16 DIAGNOSIS — M1990 Unspecified osteoarthritis, unspecified site: Secondary | ICD-10-CM | POA: Insufficient documentation

## 2019-08-16 DIAGNOSIS — Z86711 Personal history of pulmonary embolism: Secondary | ICD-10-CM | POA: Diagnosis not present

## 2019-08-16 DIAGNOSIS — Z6828 Body mass index (BMI) 28.0-28.9, adult: Secondary | ICD-10-CM | POA: Insufficient documentation

## 2019-08-16 DIAGNOSIS — K5732 Diverticulitis of large intestine without perforation or abscess without bleeding: Principal | ICD-10-CM | POA: Insufficient documentation

## 2019-08-16 DIAGNOSIS — E559 Vitamin D deficiency, unspecified: Secondary | ICD-10-CM | POA: Insufficient documentation

## 2019-08-16 DIAGNOSIS — R1031 Right lower quadrant pain: Secondary | ICD-10-CM | POA: Diagnosis not present

## 2019-08-16 DIAGNOSIS — K5792 Diverticulitis of intestine, part unspecified, without perforation or abscess without bleeding: Secondary | ICD-10-CM | POA: Diagnosis present

## 2019-08-16 LAB — CBC
HCT: 44.3 % (ref 39.0–52.0)
Hemoglobin: 15.3 g/dL (ref 13.0–17.0)
MCH: 30.1 pg (ref 26.0–34.0)
MCHC: 34.5 g/dL (ref 30.0–36.0)
MCV: 87 fL (ref 80.0–100.0)
Platelets: 154 10*3/uL (ref 150–400)
RBC: 5.09 MIL/uL (ref 4.22–5.81)
RDW: 12.5 % (ref 11.5–15.5)
WBC: 12.7 10*3/uL — ABNORMAL HIGH (ref 4.0–10.5)
nRBC: 0 % (ref 0.0–0.2)

## 2019-08-16 LAB — COMPREHENSIVE METABOLIC PANEL
ALT: 17 U/L (ref 0–44)
AST: 18 U/L (ref 15–41)
Albumin: 4 g/dL (ref 3.5–5.0)
Alkaline Phosphatase: 63 U/L (ref 38–126)
Anion gap: 12 (ref 5–15)
BUN: 13 mg/dL (ref 8–23)
CO2: 25 mmol/L (ref 22–32)
Calcium: 9.1 mg/dL (ref 8.9–10.3)
Chloride: 102 mmol/L (ref 98–111)
Creatinine, Ser: 0.99 mg/dL (ref 0.61–1.24)
GFR calc Af Amer: >60 mL/min (ref >60)
GFR calc non Af Amer: >60 mL/min (ref >60)
Glucose, Bld: 92 mg/dL (ref 70–99)
Potassium: 4.1 mmol/L (ref 3.5–5.1)
Sodium: 139 mmol/L (ref 135–145)
Total Bilirubin: 1.9 mg/dL — ABNORMAL HIGH (ref 0.3–1.2)
Total Protein: 7.5 g/dL (ref 6.5–8.1)

## 2019-08-16 LAB — URINALYSIS, COMPLETE (UACMP) WITH MICROSCOPIC
Bacteria, UA: NONE SEEN
Bilirubin Urine: NEGATIVE
Glucose, UA: NEGATIVE mg/dL
Ketones, ur: NEGATIVE mg/dL
Leukocytes,Ua: NEGATIVE
Nitrite: NEGATIVE
Protein, ur: NEGATIVE mg/dL
Specific Gravity, Urine: 1.018 (ref 1.005–1.030)
Squamous Epithelial / HPF: NONE SEEN (ref 0–5)
pH: 6 (ref 5.0–8.0)

## 2019-08-16 LAB — LIPASE, BLOOD: Lipase: 22 U/L (ref 11–51)

## 2019-08-16 MED ORDER — ACETAMINOPHEN 500 MG PO TABS
1000.0000 mg | ORAL_TABLET | Freq: Once | ORAL | Status: AC
  Administered 2019-08-16: 1000 mg via ORAL
  Filled 2019-08-16: qty 2

## 2019-08-16 MED ORDER — METRONIDAZOLE IN NACL 5-0.79 MG/ML-% IV SOLN
500.0000 mg | Freq: Once | INTRAVENOUS | Status: AC
  Administered 2019-08-16: 21:00:00 500 mg via INTRAVENOUS
  Filled 2019-08-16: qty 100

## 2019-08-16 MED ORDER — ENOXAPARIN SODIUM 40 MG/0.4ML ~~LOC~~ SOLN: 40.0000 mg | SUBCUTANEOUS | Status: DC

## 2019-08-16 MED ORDER — ONDANSETRON 4 MG PO TBDP
4.0000 mg | ORAL_TABLET | Freq: Once | ORAL | Status: AC
  Administered 2019-08-16: 21:00:00 4 mg via ORAL
  Filled 2019-08-16: qty 1

## 2019-08-16 MED ORDER — IOHEXOL 300 MG/ML  SOLN
100.0000 mL | Freq: Once | INTRAMUSCULAR | Status: AC | PRN
  Administered 2019-08-16: 100 mL via INTRAVENOUS

## 2019-08-16 MED ORDER — UMECLIDINIUM BROMIDE 62.5 MCG/INH IN AEPB
1.0000 | INHALATION_SPRAY | Freq: Every day | RESPIRATORY_TRACT | Status: DC
  Administered 2019-08-17: 1 via RESPIRATORY_TRACT
  Filled 2019-08-16: qty 7

## 2019-08-16 MED ORDER — VITAMIN D 25 MCG (1000 UNIT) PO TABS
5000.0000 [IU] | ORAL_TABLET | Freq: Every day | ORAL | Status: DC
  Administered 2019-08-17: 09:00:00 5000 [IU] via ORAL
  Filled 2019-08-16: qty 5

## 2019-08-16 MED ORDER — ALBUTEROL SULFATE (2.5 MG/3ML) 0.083% IN NEBU: 2.5000 mg | INHALATION_SOLUTION | Freq: Four times a day (QID) | RESPIRATORY_TRACT | Status: DC | PRN

## 2019-08-16 MED ORDER — SODIUM CHLORIDE 0.9 % IV SOLN
Freq: Once | INTRAVENOUS | Status: AC
  Administered 2019-08-17: 01:00:00 via INTRAVENOUS

## 2019-08-16 MED ORDER — SIMVASTATIN 10 MG PO TABS
20.0000 mg | ORAL_TABLET | Freq: Every day | ORAL | Status: DC
  Administered 2019-08-17: 20 mg via ORAL
  Filled 2019-08-16 (×2): qty 2

## 2019-08-16 MED ORDER — IPRATROPIUM-ALBUTEROL 0.5-2.5 (3) MG/3ML IN SOLN: 3.0000 mL | RESPIRATORY_TRACT | Status: DC | PRN

## 2019-08-16 MED ORDER — MORPHINE SULFATE (PF) 2 MG/ML IV SOLN: 2.0000 mg | INTRAVENOUS | Status: DC | PRN

## 2019-08-16 MED ORDER — ONDANSETRON HCL 4 MG PO TABS: 4.0000 mg | ORAL_TABLET | Freq: Four times a day (QID) | ORAL | Status: DC | PRN

## 2019-08-16 MED ORDER — CIPROFLOXACIN IN D5W 400 MG/200ML IV SOLN
400.0000 mg | Freq: Once | INTRAVENOUS | Status: AC
  Administered 2019-08-16: 22:00:00 400 mg via INTRAVENOUS
  Filled 2019-08-16: qty 200

## 2019-08-16 MED ORDER — TRAZODONE HCL 50 MG PO TABS: 25.0000 mg | ORAL_TABLET | Freq: Every evening | ORAL | Status: DC | PRN

## 2019-08-16 MED ORDER — ACETAMINOPHEN 325 MG PO TABS: 650.0000 mg | ORAL_TABLET | Freq: Four times a day (QID) | ORAL | Status: DC | PRN

## 2019-08-16 MED ORDER — APIXABAN 5 MG PO TABS
5.0000 mg | ORAL_TABLET | Freq: Two times a day (BID) | ORAL | Status: DC
  Administered 2019-08-17 (×2): 5 mg via ORAL
  Filled 2019-08-16 (×2): qty 1

## 2019-08-16 MED ORDER — OXYCODONE-ACETAMINOPHEN 5-325 MG PO TABS: 1.0000 | ORAL_TABLET | ORAL | Status: DC | PRN

## 2019-08-16 MED ORDER — SODIUM CHLORIDE 0.9 % IV SOLN
INTRAVENOUS | Status: DC
  Administered 2019-08-17: 10:00:00 via INTRAVENOUS

## 2019-08-16 MED ORDER — MAGNESIUM HYDROXIDE 400 MG/5ML PO SUSP: 30.0000 mL | Freq: Every day | ORAL | Status: DC | PRN

## 2019-08-16 MED ORDER — IPRATROPIUM-ALBUTEROL 0.5-2.5 (3) MG/3ML IN SOLN
3.0000 mL | Freq: Once | RESPIRATORY_TRACT | Status: AC
  Administered 2019-08-16: 21:00:00 3 mL via RESPIRATORY_TRACT
  Filled 2019-08-16: qty 3

## 2019-08-16 MED ORDER — ONDANSETRON HCL 4 MG/2ML IJ SOLN: 4.0000 mg | Freq: Four times a day (QID) | INTRAMUSCULAR | Status: DC | PRN

## 2019-08-16 MED ORDER — ACETAMINOPHEN 650 MG RE SUPP: 650.0000 mg | Freq: Four times a day (QID) | RECTAL | Status: DC | PRN

## 2019-08-16 MED ORDER — VITAMIN B-12 1000 MCG PO TABS
1000.0000 ug | ORAL_TABLET | Freq: Every day | ORAL | Status: DC
  Administered 2019-08-17: 1000 ug via ORAL
  Filled 2019-08-16: qty 1

## 2019-08-16 MED ORDER — METRONIDAZOLE IN NACL 5-0.79 MG/ML-% IV SOLN
500.0000 mg | Freq: Three times a day (TID) | INTRAVENOUS | Status: DC
  Administered 2019-08-17 (×2): 500 mg via INTRAVENOUS
  Filled 2019-08-16 (×5): qty 100

## 2019-08-16 MED ORDER — TRAMADOL HCL 50 MG PO TABS
50.0000 mg | ORAL_TABLET | Freq: Once | ORAL | Status: AC
  Administered 2019-08-16: 50 mg via ORAL
  Filled 2019-08-16: qty 1

## 2019-08-16 MED ORDER — CIPROFLOXACIN IN D5W 400 MG/200ML IV SOLN
400.0000 mg | Freq: Two times a day (BID) | INTRAVENOUS | Status: DC
  Administered 2019-08-17: 09:00:00 400 mg via INTRAVENOUS
  Filled 2019-08-16 (×3): qty 200

## 2019-08-16 NOTE — H&P (Signed)
[facility] [place] NAME: Seth Perez    MR#:  [id number]  DATE OF BIRTH:  1941/05/30  DATE OF ADMISSION:  08/16/2019  PRIMARY CARE PHYSICIAN: [name], MD   REQUESTING/REFERRING PHYSICIAN: Merlyn Lot, MD CHIEF COMPLAINT:   Chief Complaint  Patient presents with   Abdominal Pain    HISTORY OF PRESENT ILLNESS:  Seth Perez  is a 78 y.o. Caucasian male with a known history of COPD, PE on Xarelto and osteoarthritis as well as herpes zoster, who presented to the emergency room with acute onset of mid and right lower quadrant abdominal pain with no fever or chills, nausea or vomiting.  He denies any dysuria, oliguria or hematuria or flank pain.  No headache or dizziness or blurred vision.  No cough or wheezing or dyspnea.  No recent exposure to COVID-19.  Upon presentation to the emergency room vital signs were within normal.  Labs revealed mild leukocytosis of 12.7 and unremarkable CMP.  COVID-19 test is currently pending.  Urinalysis showed hyaline casts and 21-25 RBCs with only 0-5 WBCs.  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  The patient was given IV Cipro and Flagyl as well as IV morphine sulfate and p.o. Zofran, p.o. tramadol and DuoNeb's.  He will be admitted to an observation medical bed for further evaluation and management. PAST MEDICAL HISTORY:   Past Medical History:  Diagnosis Date   Arthritis    Chicken pox    COPD (chronic obstructive pulmonary disease) ([place])    Pulmonary emboli ([place])    on Xarelto    PAST SURGICAL HISTORY:   Past Surgical History:  Procedure Laterality Date   CHOLECYSTECTOMY  2008   TONSILLECTOMY AND ADENOIDECTOMY  1947    SOCIAL HISTORY:   Social History   Tobacco  Use   Smoking status: Former Smoker    Packs/day: 1.00    Years: 59.00    Pack years: 59.00    Types: Cigarettes    Quit date: 10/12/2012    Years since quitting: 6.8   Smokeless tobacco: Never Used  Substance Use Topics   Alcohol use: Yes    Alcohol/week: 2.0 standard drinks    Types: 2 Standard drinks or equivalent per week    FAMILY HISTORY:   Family History  Problem Relation Age of Onset   Arthritis Mother    Lung cancer Brother        was a smoker   Breast cancer Maternal Grandmother    Heart disease Maternal Grandfather    Asthma Father    Emphysema Father    Heart disease Paternal Grandfather     DRUG ALLERGIES:  No Known Allergies  REVIEW OF SYSTEMS:   ROS As per history of present illness. All pertinent systems were reviewed above. Constitutional,  HEENT, cardiovascular, respiratory, GI, GU, musculoskeletal, neuro, psychiatric, endocrine,  integumentary and hematologic systems were reviewed and are otherwise  negative/unremarkable except for positive findings mentioned above in the HPI.   MEDICATIONS AT HOME:   Prior to Admission medications   Medication Sig Start Date End Date Taking? Authorizing Provider  albuterol (VENTOLIN HFA) 108 (90 Base) MCG/ACT inhaler Inhale 2 puffs into the lungs every 6 (six) hours as needed for wheezing. Reported on 02/28/2016 02/28/16  Yes [name],  MD  ELIQUIS 5 MG TABS tablet TAKE 1 TABLET BY MOUTH TWICE A DAY 03/22/19  Yes [name], MD  simvastatin (ZOCOR) 20 MG tablet Take 1 tablet (20 mg total) by mouth at bedtime. 03/02/19  Yes [name], MD  umeclidinium bromide (INCRUSE ELLIPTA) 62.5 MCG/INH AEPB Inhale 1 puff into the lungs daily. 08/29/16  Yes Juanito Doom, MD  vitamin B-12 (CYANOCOBALAMIN) 1000 MCG tablet Take 1,000 mcg by mouth daily. Pt takes 2 tabs daily.   Yes [provider]  VITAMIN D, CHOLECALCIFEROL, PO Take 5,000 Units by mouth daily.   Yes [provider]       VITAL SIGNS:  Blood pressure 129/86, pulse 90, temperature 98.4 F (36.9 C), temperature source Oral, resp. rate 20, height 6\' 4"  (1.93 m), weight 104.3 kg, SpO2 96 %.  PHYSICAL EXAMINATION:  Physical Exam  GENERAL:  78 y.o.-year-old-year-old Caucasian male patient lying in the bed with no acute distress.  EYES: Pupils equal, round, reactive to light and accommodation. No scleral icterus. Extraocular muscles intact.  HEENT: Head atraumatic, normocephalic. Oropharynx and nasopharynx clear.  NECK:  Supple, no jugular venous distention. No thyroid enlargement, no tenderness.  LUNGS: Normal breath sounds bilaterally, no wheezing, rales,rhonchi or crepitation. No use of accessory muscles of respiration.  CARDIOVASCULAR: Regular rate and rhythm, S1, S2 normal. No murmurs, rubs, or gallops.  ABDOMEN: Soft with right-sided mid abdominal tenderness without rebound tenderness guarding or rigidity.  Bowel sounds present. No organomegaly or mass.  EXTREMITIES: No pedal edema, cyanosis, or clubbing.  NEUROLOGIC: Cranial nerves II through XII are intact. Muscle strength 5/5 in all extremities. Sensation intact. Gait not checked.  PSYCHIATRIC: The patient is alert and oriented x 3.  Normal affect and good eye contact. SKIN: No obvious rash, lesion, or ulcer.   LABORATORY PANEL:   CBC Recent Labs  Lab 08/16/19 1822  WBC 12.7*  HGB 15.3  HCT 44.3  PLT 154   ------------------------------------------------------------------------------------------------------------------  Chemistries  Recent Labs  Lab 08/16/19 1822  NA 139  K 4.1  CL 102  CO2 25  GLUCOSE 92  BUN 13  CREATININE 0.99  CALCIUM 9.1  AST 18  ALT 17  ALKPHOS 63  BILITOT 1.9*   ------------------------------------------------------------------------------------------------------------------  Cardiac Enzymes No results for input(s): TROPONINI in the last 168  hours. ------------------------------------------------------------------------------------------------------------------  RADIOLOGY:  Ct Abdomen Pelvis W Contrast  Result Date: 08/16/2019 CLINICAL DATA:  Right lower quadrant pain. Symptoms began this morning. EXAM: CT ABDOMEN AND PELVIS WITH CONTRAST TECHNIQUE: Multidetector CT imaging of the abdomen and pelvis was performed using the standard protocol following bolus administration of intravenous contrast. CONTRAST:  175mL OMNIPAQUE IOHEXOL 300 MG/ML  SOLN COMPARISON:  05/15/2007 abdominal ultrasound.  No comparison CT. FINDINGS: Lower chest: 3 mm right middle lobe pulmonary nodule on 01/04. There is also a right middle lobe calcified granuloma. Left lower lobe volume loss. Cardiomegaly. Left hemidiaphragm elevation. Hepatobiliary: Normal liver. Cholecystectomy, without biliary ductal dilatation. Pancreas: Mild pancreatic atrophy, without duct dilatation or acute inflammation. Spleen: Normal in size, without focal abnormality. Adrenals/Urinary Tract: Normal adrenal glands. A lower pole exophytic right renal 4.9 cm cyst. Normal left kidney, without hydronephrosis. Normal urinary bladder. Stomach/Bowel: Normal stomach, without wall thickening. There is a loop of thickened sigmoid colon with surrounding edema in the region of diverticula of the right lower abdomen including on 47/2 and 23 coronal. No free perforation or drainable surrounding fluid collection. Normal terminal ileum and appendix.  Normal small bowel. Vascular/Lymphatic: IVC filter.  Aortic and branch vessel atherosclerosis. 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 Small abdominal retroperitoneal nodes. None pathologic by size criteria. No pelvic sidewall adenopathy. Reproductive: Mild prostatomegaly. Other: Small volume cul-de-sac fluid. Right paramidline tiny fat containing ventral abdominal wall hernia. Musculoskeletal: Degenerative partial fusion of the bilateral  sacroiliac joints. Lumbosacral spondylosis. IMPRESSION: 1. Edema centered about the sigmoid colon, favored to represent non complicated moderate diverticulitis. Given the focality of the abnormality, consider follow-up colonoscopy to exclude less likely superinfection of a carcinoma. 2. Normal appendix. 3. Aortic Atherosclerosis (ICD10-I70.0). 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 4. Small volume cul-de-sac fluid, likely secondary. 5. Right paramidline fat containing ventral abdominal wall hernia. 6. Right middle lobe pulmonary nodule. No follow-up needed if patient is low-risk. Non-contrast chest CT can be considered in 12 months if patient is high-risk. This recommendation follows the consensus statement: Guidelines for Management of Incidental Pulmonary Nodules Detected on CT Images: From the Fleischner Society 2017; Radiology 2017; 284:228-243. Electronically Signed   By: Abigail Miyamoto M.D.   On: 08/16/2019 19:54      IMPRESSION AND PLAN:   1.  Acute sigmoid diverticulitis.  The patient will be admitted to an observation medical bed.  We will continue back therapy with IV Cipro and Flagyl which has been well-tolerated in the ER.  Pain management will be provided.  2.  COPD.  We will place him on as needed duo nebs and continue his Incruse Ellipta.  3.  Dyslipidemia.  Statin therapy will be resumed.  4.   Vitamin D deficiency.  We will continue vitamin D.  5.  Vitamin B-12 deficiency.  We will continue vitamin B12.  6.  DVT prophylaxis.  Subcutaneous Lovenox.   All the records are reviewed and case discussed with ED provider. The plan of care was discussed in details with the patient (and family). I answered all questions. The patient agreed to proceed with the above mentioned plan. Further management will depend upon hospital course.   CODE STATUS: Full code  TOTAL TIME TAKING CARE OF THIS PATIENT: 50 minutes.    Christel Mormon M.D on 08/16/2019 at 10:15 PM  [facility]   From 7 PM-7 AM, contact night-coverage [email or web address]  CC: Primary care physician; [name], MD   Note: This dictation was prepared with Dragon dictation along with smaller phrase technology. Any transcriptional errors that result from this process are unintentional.

## 2019-08-16 NOTE — ED Provider Notes (Signed)
[facility] Emergency Department Provider Note    First MD Initiated Contact with Patient 08/16/19 2002     (approximate)  I have reviewed the triage vital signs and the nursing notes.   HISTORY  Chief Complaint Abdominal Pain    HPI Seth STALVEY Sr. is a 78 y.o. male with the below listed past medical history presents to ER for RLQ abdominal pain that started today.  Has never had pain like this before.  Reports as moderate to severe.  Pain worse with ambulation.  No measured fevers.  Denies any significant constipation or diarrhea.  Denies any chest pain or shortness of breath.  Not currently on any antibiotics.  Denies any previous history of diverticulosis or IBD.    Past Medical History:  Diagnosis Date  . Arthritis   . Chicken pox   . COPD (chronic obstructive pulmonary disease) ([place])   . Pulmonary emboli (HCC)    on Xarelto   Family History  Problem Relation Age of Onset  . Arthritis Mother   . Lung cancer Brother        was a smoker  . Breast cancer Maternal Grandmother   . Heart disease Maternal Grandfather   . Asthma Father   . Emphysema Father   . Heart disease Paternal Grandfather    Past Surgical History:  Procedure Laterality Date  . CHOLECYSTECTOMY  2008  . TONSILLECTOMY AND ADENOIDECTOMY  1947   Patient Active Problem List   Diagnosis Date Noted  . Recurrent pulmonary embolism ([place]) 03/22/2019  . Hard of hearing 09/05/2018  . Hyperlipidemia with low HDL 02/02/2017  . Insomnia 02/02/2017  . Hypervitaminosis D 02/02/2017  . S/P insertion of IVC (inferior vena caval) filter 02/02/2017  . 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 Personal history of tobacco use, presenting hazards to health 05/21/2016  . Orthostasis 02/29/2016  . Tobacco use disorder, mild, in sustained remission, abuse 02/16/2016  . Lower extremity venous stasis 06/19/2014  . Pulmonary embolism and infarction ([place]) 01/21/2014  . Benign localized  hyperplasia of prostate with urinary obstruction and other lower urinary tract symptoms (LUTS)(600.21) 01/06/2013  . Elevated prostate specific antigen (PSA) 01/06/2013  . Incomplete emptying of bladder 01/06/2013  . Microscopic hematuria 01/06/2013  . History of pulmonary embolism 11/24/2012  . Multiple pulmonary nodules determined by computed tomography of lung 11/24/2012  . Hospital discharge follow-up 11/24/2012  . COPD (chronic obstructive pulmonary disease) with emphysema ([place]) 11/03/2012  . Colon cancer screening 11/03/2012  . Prostate cancer screening 11/03/2012  . Routine general medical examination at a health care facility 11/03/2012      Prior to Admission medications   Medication Sig Start Date End Date Taking? Authorizing Provider  albuterol (VENTOLIN HFA) 108 (90 Base) MCG/ACT inhaler Inhale 2 puffs into the lungs every 6 (six) hours as needed for wheezing. Reported on 02/28/2016 02/28/16   [name], MD  ELIQUIS 5 MG TABS tablet TAKE 1 TABLET BY MOUTH TWICE A DAY 03/22/19   [name], MD  simvastatin (ZOCOR) 20 MG tablet Take 1 tablet (20 mg total) by mouth at bedtime. 03/02/19   [name], MD  umeclidinium bromide (INCRUSE ELLIPTA) 62.5 MCG/INH AEPB Inhale 1 puff into the lungs daily. 08/29/16   Juanito Doom, MD  vitamin B-12 (CYANOCOBALAMIN) 1000 MCG tablet Take 1,000 mcg by mouth daily. Pt takes 2 tabs daily.    [provider]  VITAMIN D, CHOLECALCIFEROL, PO Take 5,000 Units by mouth daily.  [provider]    Allergies Patient has no known allergies.    Social History Social History   Tobacco Use  . Smoking status: Former Smoker    Packs/day: 1.00    Years: 59.00    Pack years: 59.00    Types: Cigarettes    Quit date: 10/12/2012    Years since quitting: 6.8  . Smokeless tobacco: Never Used  Substance Use Topics  . Alcohol use: Yes    Alcohol/week: 2.0 standard drinks    Types: 2 Standard drinks or equivalent per week   . Drug use: No    Review of Systems Patient denies headaches, rhinorrhea, blurry vision, numbness, shortness of breath, chest pain, edema, cough, abdominal pain, nausea, vomiting, diarrhea, dysuria, fevers, rashes or hallucinations unless otherwise stated above in HPI. ____________________________________________   PHYSICAL EXAM:  VITAL SIGNS: Vitals:   08/16/19 1811  BP: 129/86  Pulse: 90  Resp: 20  Temp: 98.4 F (36.9 C)  SpO2: 96%    Constitutional: Alert and oriented.  Eyes: Conjunctivae are normal.  Head: Atraumatic. Nose: No congestion/rhinnorhea. Mouth/Throat: Mucous membranes are moist.   Neck: No stridor. Painless ROM.  Cardiovascular: Normal rate, regular rhythm. Grossly normal heart sounds.  Good peripheral circulation. Respiratory: Normal respiratory effort.  No retractions. Lungs CTAB. Gastrointestinal: Soft with mild ttp in RLQ. No rebound ttp.  No distention. No abdominal bruits. No CVA tenderness. Genitourinary:  Musculoskeletal: No lower extremity tenderness nor edema.  No joint effusions. Neurologic:  Normal speech and language. No gross focal neurologic deficits are appreciated. No facial droop Skin:  Skin is warm, dry and intact. No rash noted. Psychiatric: Mood and affect are normal. Speech and behavior are normal.  ____________________________________________   LABS (all labs ordered are listed, but only abnormal results are displayed)  Results for orders placed or performed during the hospital encounter of 08/16/19 (from the past 24 hour(s))  Urinalysis, Complete w Microscopic     Status: Abnormal   Collection Time: 08/16/19  6:12 PM  Result Value Ref Range   Color, Urine YELLOW (A) YELLOW   APPearance CLEAR (A) CLEAR   Specific Gravity, Urine 1.018 1.005 - 1.030   pH 6.0 5.0 - 8.0   Glucose, UA NEGATIVE NEGATIVE mg/dL   Hgb urine dipstick MODERATE (A) NEGATIVE   Bilirubin Urine NEGATIVE NEGATIVE   Ketones, ur NEGATIVE NEGATIVE mg/dL    Protein, ur NEGATIVE NEGATIVE mg/dL   Nitrite NEGATIVE NEGATIVE   Leukocytes,Ua NEGATIVE NEGATIVE   RBC / HPF 21-50 0 - 5 RBC/hpf   WBC, UA 0-5 0 - 5 WBC/hpf   Bacteria, UA NONE SEEN NONE SEEN   Squamous Epithelial / LPF NONE SEEN 0 - 5   Mucus PRESENT    Hyaline Casts, UA PRESENT   Lipase, blood     Status: None   Collection Time: 08/16/19  6:22 PM  Result Value Ref Range   Lipase 22 11 - 51 U/L  Comprehensive metabolic panel     Status: Abnormal   Collection Time: 08/16/19  6:22 PM  Result Value Ref Range   Sodium 139 135 - 145 mmol/L   Potassium 4.1 3.5 - 5.1 mmol/L   Chloride 102 98 - 111 mmol/L   CO2 25 22 - 32 mmol/L   Glucose, Bld 92 70 - 99 mg/dL   BUN 13 8 - 23 mg/dL   Creatinine, Ser 0.99 0.61 - 1.24 mg/dL   Calcium 9.1 8.9 - 10.3 mg/dL   Total Protein 7.5 6.5 -  8.1 g/dL   Albumin 4.0 3.5 - 5.0 g/dL   AST 18 15 - 41 U/L   ALT 17 0 - 44 U/L   Alkaline Phosphatase 63 38 - 126 U/L   Total Bilirubin 1.9 (H) 0.3 - 1.2 mg/dL   GFR calc non Af Amer >60 >60 mL/min   GFR calc Af Amer >60 >60 mL/min   Anion gap 12 5 - 15  CBC     Status: Abnormal   Collection Time: 08/16/19  6:22 PM  Result Value Ref Range   WBC 12.7 (H) 4.0 - 10.5 K/uL   RBC 5.09 4.22 - 5.81 MIL/uL   Hemoglobin 15.3 13.0 - 17.0 g/dL   HCT 44.3 39.0 - 52.0 %   MCV 87.0 80.0 - 100.0 fL   MCH 30.1 26.0 - 34.0 pg   MCHC 34.5 30.0 - 36.0 g/dL   RDW 12.5 11.5 - 15.5 %   Platelets 154 150 - 400 K/uL   nRBC 0.0 0.0 - 0.2 %   ____________________________________________   ____________________________________________  RADIOLOGY  I personally reviewed all radiographic images ordered to evaluate for the above acute complaints and reviewed radiology reports and findings.  These findings were personally discussed with the patient.  Please see medical record for radiology report.  ____________________________________________   PROCEDURES  Procedure(s) performed:  Procedures    Critical Care  performed: no ____________________________________________   INITIAL IMPRESSION / ASSESSMENT AND PLAN / ED COURSE  Pertinent labs & imaging results that were available during my care of the patient were reviewed by me and considered in my medical decision making (see chart for details).   DDX: Sinusitis, diverticulitis, colitis, IBD, perforation, abscess, UTI, hernia  Seth Bales Sr. is a 78 y.o. who presents to the ED with symptoms as described above.  Patient afebrile nontoxic-appearing.  Does have mild leukocytosis therefore CT imaging ordered given his acute right lower quadrant pain to rule out the above differential.  Show evidence of uncomplicated diverticulitis.  Patient given antibiotics here in the ER.  Does have some wheezing on exam and has chronic COPD but not requiring any supplemental oxygen.  Patient is having fairly significant tenderness and discomfort with walking.  No peritonitis at this time but given his age first diagnosis of diverticulitis and risk factors with pain not controlled on p.o. medication I do feel that he would benefit from observation in the hospital.  Have ordered IV antibiotics.  We will place him normal saline infusion.  Will discuss case with hospitalist for admission.     The patient was evaluated in Emergency Department today for the symptoms described in the history of present illness. He/she was evaluated in the context of the global COVID-19 pandemic, which necessitated consideration that the patient might be at risk for infection with the SARS-CoV-2 virus that causes COVID-19. Institutional protocols and algorithms that pertain to the evaluation of patients at risk for COVID-19 are in a state of rapid change based on information released by regulatory bodies including the CDC and federal and state organizations. These policies and algorithms were followed during the patient's care in the ED.  As part of my medical decision making, I reviewed the  following data within the [place] notes reviewed and incorporated, Labs reviewed, notes from prior ED visits and [place] Controlled Substance Database   ____________________________________________   FINAL CLINICAL IMPRESSION(S) / ED DIAGNOSES  Final diagnoses:  Diverticulitis of large intestine without perforation or abscess without bleeding  NEW MEDICATIONS STARTED DURING THIS VISIT:  New Prescriptions   No medications on file     Note:  This document was prepared using Dragon voice recognition software and may include unintentional dictation errors.    Merlyn Lot, MD 08/16/19 2158

## 2019-08-16 NOTE — Progress Notes (Signed)
Report called to Neoma Laming on 1A

## 2019-08-16 NOTE — ED Triage Notes (Signed)
PT to ED reporting RLQ pain that started this morning when he woke. Pt reporting the pain feels like a knot but denies NVD.

## 2019-08-17 ENCOUNTER — Telehealth: Payer: Self-pay

## 2019-08-17 ENCOUNTER — Other Ambulatory Visit: Payer: Self-pay | Admitting: Internal Medicine

## 2019-08-17 DIAGNOSIS — K5732 Diverticulitis of large intestine without perforation or abscess without bleeding: Secondary | ICD-10-CM | POA: Diagnosis not present

## 2019-08-17 DIAGNOSIS — Z86711 Personal history of pulmonary embolism: Secondary | ICD-10-CM

## 2019-08-17 DIAGNOSIS — R911 Solitary pulmonary nodule: Secondary | ICD-10-CM

## 2019-08-17 DIAGNOSIS — E663 Overweight: Secondary | ICD-10-CM | POA: Diagnosis not present

## 2019-08-17 DIAGNOSIS — J432 Centrilobular emphysema: Secondary | ICD-10-CM | POA: Diagnosis not present

## 2019-08-17 LAB — CBC
HCT: 40.9 % (ref 39.0–52.0)
Hemoglobin: 14.1 g/dL (ref 13.0–17.0)
MCH: 30.3 pg (ref 26.0–34.0)
MCHC: 34.5 g/dL (ref 30.0–36.0)
MCV: 87.8 fL (ref 80.0–100.0)
Platelets: 149 10*3/uL — ABNORMAL LOW (ref 150–400)
RBC: 4.66 MIL/uL (ref 4.22–5.81)
RDW: 12.7 % (ref 11.5–15.5)
WBC: 9.3 10*3/uL (ref 4.0–10.5)
nRBC: 0 % (ref 0.0–0.2)

## 2019-08-17 LAB — SARS CORONAVIRUS 2 (TAT 6-24 HRS): SARS Coronavirus 2: NEGATIVE

## 2019-08-17 LAB — BASIC METABOLIC PANEL
Anion gap: 9 (ref 5–15)
BUN: 15 mg/dL (ref 8–23)
CO2: 26 mmol/L (ref 22–32)
Calcium: 8.7 mg/dL — ABNORMAL LOW (ref 8.9–10.3)
Chloride: 105 mmol/L (ref 98–111)
Creatinine, Ser: 1.03 mg/dL (ref 0.61–1.24)
GFR calc Af Amer: >60 mL/min (ref >60)
GFR calc non Af Amer: >60 mL/min (ref >60)
Glucose, Bld: 94 mg/dL (ref 70–99)
Potassium: 3.8 mmol/L (ref 3.5–5.1)
Sodium: 140 mmol/L (ref 135–145)

## 2019-08-17 MED ORDER — B-12 1000-400 MCG SL SUBL: 1000.0000 ug | SUBLINGUAL_TABLET | Freq: Every day | SUBLINGUAL | 3 refills | Status: DC

## 2019-08-17 MED ORDER — METRONIDAZOLE 250 MG PO TABS: 250.0000 mg | ORAL_TABLET | Freq: Three times a day (TID) | ORAL | 0 refills | Status: DC

## 2019-08-17 MED ORDER — METRONIDAZOLE 250 MG PO TABS: 500.0000 mg | ORAL_TABLET | Freq: Three times a day (TID) | ORAL | 0 refills | Status: AC

## 2019-08-17 MED ORDER — LEVOFLOXACIN 500 MG PO TABS: 500.0000 mg | ORAL_TABLET | Freq: Every day | ORAL | 0 refills | Status: AC

## 2019-08-17 NOTE — Telephone Encounter (Signed)
Copied from [place] [id number]. Topic: General - Other >> Aug 17, 2019  3:40 PM Parke Poisson wrote: Reason for CRM: Seth Perez from [facility] states that lung nodule was incidentally found out CT abd and pt will need follow up

## 2019-08-17 NOTE — Progress Notes (Signed)
Patient discharged home per MD order. All discharge instructions given and all questions answered. 

## 2019-08-17 NOTE — Discharge Summary (Addendum)
Discharge Summary  [place] TA:[id number] DOB: 1941/03/28  PCP: [name], MD  Admit date: 08/16/2019 Discharge date: 08/17/2019  Time spent: 35 minutes  Recommendations for Outpatient Follow-up:  1. New medication: Levaquin 500 mg p.o. daily x4 days 2. New medication: Flagyl 500 mg every 8 hours x5 days 3. Patient will need a repeat chest CT in 12 months to reassess a right middle lobe pulmonary nodule. 4. He will follow-up with his PCP in the next 2 weeks  Discharge Diagnoses:  Active Hospital Problems   Diagnosis Date Noted   Acute diverticulitis 08/16/2019    Resolved Hospital Problems  No resolved problems to display.    Discharge Condition: Improved, being discharged home  Diet recommendation: Full liquid diet x2 days, then soft bland diet x1 day  Vitals:   08/17/19 0853 08/17/19 0934  BP: (!) 95/58 98/67  Pulse: (!) 57 70  Resp: (!) 24 20  Temp: 98.6 F (37 C)   SpO2: 94%     History of present illness:  78 year old male with past medical history of COPD, PE on Xarelto and hyperlipidemia presented to the emergency room on the evening of 11/23 with complaints of lower quadrant abdominal pain mostly in the middle and right lower quadrant, but no fevers or chills, nausea or vomiting.  In the emergency room, found to have a mild white count 12.7 and an abdominal CT noted moderate sigmoid diverticulitis with normal-looking appendix.  With these findings, patient was brought in for diverticulitis treatment, put on IV antibiotics and started on clear liquids.  Hospital Course:  Active Problems:   Acute diverticulitis: First-time diagnosis.  Confirmed by CT.  Mild case.  Patient with minimally elevated white count on admission.  Started on IV Cipro and Flagyl.  Put on clear liquids.  By following day, still some lower quadrant abdominal tenderness, but white count normalized and patient feeling much better.  Diet advanced to full liquids which he  tolerated well.  After discussion with patient, we will plan to discharge home on oral Levaquin and Flagyl for 4 more days.  He will be discharged on full liquids x2 days, then soft bland diet for 1 day then resume normal diet.  COPD: Stable during his hospitalization.  Hyperlipidemia: Continue home medication.  Overweight: Patient meets criteria with BMI greater than 25.  History of pulmonary embolus: Continue Eliquis.  Right middle lobe pulmonary nodule: Incidentally noted on abdominal CT.  Recommendation is for repeat CT follow-up in 12 months  Procedures:  None   Consultations:  None   Discharge Exam: BP 98/67    Pulse 70    Temp 98.6 F (37 C) (Oral)    Resp 20    Ht 6\' 4"  (1.93 m)    Wt 104.3 kg    SpO2 94%    BMI 28.00 kg/m   General: Alert and oriented x3, no acute distress Cardiovascular: Regular rate and rhythm, S1-S2 Respiratory: Clear to auscultation bilaterally  Discharge Instructions You were cared for by a hospitalist during your hospital stay. If you have any questions about your discharge medications or the care you received while you were in the hospital after you are discharged, you can call the unit and asked to speak with the hospitalist on call if the hospitalist that took care of you is not available. Once you are discharged, your primary care physician will handle any further medical issues. Please note that NO REFILLS for any discharge medications will be authorized once  you are discharged, as it is imperative that you return to your primary care physician (or establish a relationship with a primary care physician if you do not have one) for your aftercare needs so that they can reassess your need for medications and monitor your lab values.  Discharge Instructions    Diet - low sodium heart healthy   Complete by: As directed    Full liquids for 11/24 & 11/25 Soft bland diet for 11/26 Then regular heart healthy diet after   Increase activity slowly    Complete by: As directed      Allergies as of 08/17/2019   No Known Allergies     Medication List    TAKE these medications   albuterol 108 (90 Base) MCG/ACT inhaler Commonly known as: Ventolin HFA Inhale 2 puffs into the lungs every 6 (six) hours as needed for wheezing. Reported on 02/28/2016   Eliquis 5 MG Tabs tablet Generic drug: apixaban TAKE 1 TABLET BY MOUTH TWICE A DAY   levofloxacin 500 MG tablet Commonly known as: Levaquin Take 1 tablet (500 mg total) by mouth daily for 4 days. Start taking on: August 18, 2019   metroNIDAZOLE 250 MG tablet Commonly known as: Flagyl Take 2 tablets (500 mg total) by mouth 3 (three) times daily for 5 days.   simvastatin 20 MG tablet Commonly known as: ZOCOR Take 1 tablet (20 mg total) by mouth at bedtime.   umeclidinium bromide 62.5 MCG/INH Aepb Commonly known as: Incruse Ellipta Inhale 1 puff into the lungs daily.   vitamin B-12 1000 MCG tablet Commonly known as: CYANOCOBALAMIN Take 1,000 mcg by mouth daily. Pt takes 2 tabs daily.   VITAMIN D (CHOLECALCIFEROL) PO Take 5,000 Units by mouth daily.       No Known Allergies Follow-up Information    [name], MD Follow up in 1 week(s).   Specialty: Internal Medicine Contact information: [place]  [address] [phone]            The results of significant diagnostics from this hospitalization (including imaging, microbiology, ancillary and laboratory) are listed below for reference.    Significant Diagnostic Studies: Ct Abdomen Pelvis W Contrast  Result Date: 08/16/2019 CLINICAL DATA:  Right lower quadrant pain. Symptoms began this morning. EXAM: CT ABDOMEN AND PELVIS WITH CONTRAST TECHNIQUE: Multidetector CT imaging of the abdomen and pelvis was performed using the standard protocol following bolus administration of intravenous contrast. CONTRAST:  155mL OMNIPAQUE IOHEXOL 300 MG/ML  SOLN COMPARISON:  05/15/2007 abdominal  ultrasound.  No comparison CT. FINDINGS: Lower chest: 3 mm right middle lobe pulmonary nodule on 01/04. There is also a right middle lobe calcified granuloma. Left lower lobe volume loss. Cardiomegaly. Left hemidiaphragm elevation. Hepatobiliary: Normal liver. Cholecystectomy, without biliary ductal dilatation. Pancreas: Mild pancreatic atrophy, without duct dilatation or acute inflammation. Spleen: Normal in size, without focal abnormality. Adrenals/Urinary Tract: Normal adrenal glands. A lower pole exophytic right renal 4.9 cm cyst. Normal left kidney, without hydronephrosis. Normal urinary bladder. Stomach/Bowel: Normal stomach, without wall thickening. There is a loop of thickened sigmoid colon with surrounding edema in the region of diverticula of the right lower abdomen including on 47/2 and 23 coronal. No free perforation or drainable surrounding fluid collection. Normal terminal ileum and appendix.  Normal small bowel. Vascular/Lymphatic: IVC filter. Aortic and branch vessel atherosclerosis. 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 Small abdominal retroperitoneal nodes. None pathologic by size criteria. No pelvic sidewall adenopathy.  Reproductive: Mild prostatomegaly. Other: Small volume cul-de-sac fluid. Right paramidline tiny fat containing ventral abdominal wall hernia. Musculoskeletal: Degenerative partial fusion of the bilateral sacroiliac joints. Lumbosacral spondylosis. IMPRESSION: 1. Edema centered about the sigmoid colon, favored to represent non complicated moderate diverticulitis. Given the focality of the abnormality, consider follow-up colonoscopy to exclude less likely superinfection of a carcinoma. 2. Normal appendix. 3. Aortic Atherosclerosis (ICD10-I70.0). 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 4. Small volume cul-de-sac fluid, likely secondary. 5. Right paramidline fat containing ventral abdominal wall hernia. 6. Right middle lobe pulmonary nodule. No  follow-up needed if patient is low-risk. Non-contrast chest CT can be considered in 12 months if patient is high-risk. This recommendation follows the consensus statement: Guidelines for Management of Incidental Pulmonary Nodules Detected on CT Images: From the Fleischner Society 2017; Radiology 2017; 284:228-243. Electronically Signed   By: Abigail Miyamoto M.D.   On: 08/16/2019 19:54    Microbiology: Recent Results (from the past 240 hour(s))  SARS CORONAVIRUS 2 (TAT 6-24 HRS) Nasopharyngeal Nasopharyngeal Swab     Status: None   Collection Time: 08/16/19 10:00 PM   Specimen: Nasopharyngeal Swab  Result Value Ref Range Status   SARS Coronavirus 2 NEGATIVE NEGATIVE Final    Comment: (NOTE) SARS-CoV-2 target nucleic acids are NOT DETECTED. The SARS-CoV-2 RNA is generally detectable in upper and lower respiratory specimens during the acute phase of infection. Negative results do not preclude SARS-CoV-2 infection, do not rule out co-infections with other pathogens, and should not be used as the sole basis for treatment or other patient management decisions. Negative results must be combined with clinical observations, patient history, and epidemiological information. The expected result is Negative. Fact Sheet for Patients: SugarRoll.be Fact Sheet for Healthcare Providers: https://www.woods-mathews.com/ This test is not yet approved or cleared by the [place] FDA and  has been authorized for detection and/or diagnosis of SARS-CoV-2 by FDA under an Emergency Use Authorization (EUA). This EUA will remain  in effect (meaning this test can be used) for the duration of the COVID-19 declaration under Section 56 4(b)(1) of the Act, 21 U.S.C. section 360bbb-3(b)(1), unless the authorization is terminated or revoked sooner. Performed at [facility], [place] [address], [place], [place] [address]      Labs: Basic Metabolic Panel: Recent Labs    Lab 08/13/19 1023 08/16/19 1822 08/17/19 0431  NA 140 139 140  K 4.3 4.1 3.8  CL 104 102 105  CO2 27 25 26   GLUCOSE 91 92 94  BUN 14 13 15   CREATININE 0.82 0.99 1.03  CALCIUM 9.1 9.1 8.7*   Liver Function Tests: Recent Labs  Lab 08/13/19 1023 08/16/19 1822  AST 18 18  ALT 17 17  ALKPHOS 64 63  BILITOT 0.9 1.9*  PROT 6.8 7.5  ALBUMIN 4.1 4.0   Recent Labs  Lab 08/16/19 1822  LIPASE 22   No results for input(s): AMMONIA in the last 168 hours. CBC: Recent Labs  Lab 08/13/19 1023 08/16/19 1822 08/17/19 0431  WBC 7.7 12.7* 9.3  NEUTROABS 3.8  --   --   HGB 15.0 15.3 14.1  HCT 44.6 44.3 40.9  MCV 91.9 87.0 87.8  PLT 154.0 154 149*   Cardiac Enzymes: No results for input(s): CKTOTAL, CKMB, CKMBINDEX, TROPONINI in the last 168 hours. BNP: BNP (last 3 results) No results for input(s): BNP in the last 8760 hours.  ProBNP (last 3 results) No results for input(s): PROBNP in the last 8760 hours.  CBG:  No results for input(s): GLUCAP in the last 168 hours.     Signed:  Annita Brod, MD [facility] 08/17/2019, 3:02 PM

## 2019-08-17 NOTE — Discharge Instructions (Signed)
Diverticulitis  Diverticulitis is when small pockets in your large intestine (colon) get infected or swollen. This causes stomach pain and watery poop (diarrhea). These pouches are called diverticula. They form in people who have a condition called diverticulosis. Follow these instructions at home: Medicines  Take over-the-counter and prescription medicines only as told by your doctor. These include: ? Antibiotics. ? Pain medicines. ? Fiber pills. ? Probiotics. ? Stool softeners.  Do not drive or use heavy machinery while taking prescription pain medicine.  If you were prescribed an antibiotic, take it as told. Do not stop taking it even if you feel better. General instructions   Follow a diet as told by your doctor.  When you feel better, your doctor may tell you to change your diet. You may need to eat a lot of fiber. Fiber makes it easier to poop (have bowel movements). Healthy foods with fiber include: ? Berries. ? Beans. ? Lentils. ? Green vegetables.  Exercise 3 or more times a week. Aim for 30 minutes each time. Exercise enough to sweat and make your heart beat faster.  Keep all follow-up visits as told. This is important. You may need to have an exam of the large intestine. This is called a colonoscopy. Contact a doctor if:  Your pain does not get better.  You have a hard time eating or drinking.  You are not pooping like normal. Get help right away if:  Your pain gets worse.  Your problems do not get better.  Your problems get worse very fast.  You have a fever.  You throw up (vomit) more than one time.  You have poop that is: ? Bloody. ? Black. ? Tarry. Summary  Diverticulitis is when small pockets in your large intestine (colon) get infected or swollen.  Take medicines only as told by your doctor.  Follow a diet as told by your doctor. This information is not intended to replace advice given to you by your health care provider. Make sure you  discuss any questions you have with your health care provider. Document Released: 02/26/2008 Document Revised: 08/22/2017 Document Reviewed: 09/26/2016 Elsevier Patient Education  [place].   Full Liquid Diet A full liquid diet refers to fluids and foods that are liquid or will become liquid at room temperature. This diet should only be used for a short period of time to help you recover from illness or surgery. Your health care provider or dietitian will help you determine when it is safe to eat regular foods. What are tips for following this plan?     Reading food labels  Check food labels of nutrition shakes for the amount of protein. Look for nutrition shakes that have at least 8-10 grams of protein in each serving.  Look for drinks, such as milks and juices, that are "fortified" or "enriched." This means that vitamins and minerals have been added. Shopping  Buy premade nutritional shakes to keep on hand.  To vary your choices, buy different flavors of milks and shakes. Meal planning  Choose flavors and foods that you enjoy.  To make sure you get enough energy from food (calories): ? Eat 3 full liquid meals each day. Have a liquid snack between each meal. ? Drink 6-8 ounces (177-237 ml) of a nutrition supplement shake with meals or as snacks. ? Add protein powder, powdered milk, milk, or yogurt to shakes to increase the amount of protein.  Drink at least one serving a day of citrus fruit juice  or fruit juice that has vitamin C added. General guidelines  Before starting the full-liquid diet, check with your health care provider to know what foods you should avoid. These may include full-fat or high-fiber liquids.  You may have any liquid or food that becomes a liquid at room temperature. The food is considered a liquid if it can be poured off a spoon at room temperature.  Do not drink alcohol unless approved by your health care provider.  This diet gives you most  of the nutrients that you need for energy, but you may not get enough of certain vitamins, minerals, and fiber. Make sure to talk to your health care provider or dietitian about: ? How many calories you need to eat get day. ? How much fluid you should have each day. ? Taking a multivitamin or a nutritional supplement. What foods are allowed? The items listed may not be a complete list. Talk with your dietitian about what dietary choices are best for you. Grains Thin hot cereal, such as cream of wheat. Soft-cooked pasta or rice pured in soup. Vegetables Pulp-free tomato or vegetable juice. Vegetables pured in soup. Fruits Fruit juice without pulp. Strained fruit pures (seeds and skins removed). Meats and other protein foods Beef, chicken, and fish broths. Powdered protein supplements. Dairy Milk and milk-based beverages, including milk shakes and instant breakfast mixes. Smooth yogurt. Pured cottage cheese. Beverages Water. Coffee and tea (caffeinated or decaffeinated). Cocoa. Liquid nutritional supplements. Soft drinks. Nondairy milks, such as almond, coconut, rice, or soy milk. Fats and oils Melted margarine and butter. Cream. Canola, almond, avocado, corn, grapeseed, sunflower, and sesame oils. Gravy. Sweets and desserts Custard. Pudding. Flavored gelatin. Smooth ice cream (without nuts or candy pieces). Sherbet. Popsicles. [place] ice. Pudding pops. Seasoning and other foods Salt and pepper. Spices. Cocoa powder. Vinegar. Ketchup. Yellow mustard. Smooth sauces, such as [place]ise, cheese sauce, or white sauce. Soy sauce. Cream soups. Strained soups. Syrup. Honey. Jelly (without fruit pieces). What foods are not allowed? The items listed may not be a complete list. Talk with your dietitian about what dietary choices are best for you. Grains Whole grains. Pasta. Rice. Cold cereal. Bread. Crackers. Vegetables All whole fresh, frozen, or canned vegetables. Fruits All whole fresh,  frozen, or canned fruits. Meats and other protein foods All cuts of meat, poultry, and fish. Eggs. Tofu and soy protein. Nuts and nut butters. Lunch meat. Sausage. Dairy Hard cheese. Yogurt with fruit chunks. Fats and oils Coconut oil. Palm oil. Lard. Cold butter. Sweets and desserts Ice cream or other frozen desserts that have any solids in them or on top, such as nuts, chocolate chips, and pieces of cookies. Cakes. Cookies. Candy. Seasoning and other foods Stone-ground mustards. Soups with chunks or pieces. Summary  A full liquid diet refers to fluids and foods that are liquid or will become liquid at room temperature.  This diet should only be used for a short period of time to help you recover from illness or surgery. Ask your health care provider or dietitian when it is safe for you to eat regular foods.  To make sure you get enough calories and nutrients, eat 3 meals each day with snacks between. Drink premade nutrition supplement shakes or add protein powder to homemade shakes. Take a vitamin and mineral supplement as told by your health care provider. This information is not intended to replace advice given to you by your health care provider. Make sure you discuss any questions you have with your  health care provider. Document Released: 09/09/2005 Document Revised: 12/06/2017 Document Reviewed: 10/23/2016 Elsevier Patient Education  2020 [employer].

## 2019-08-18 ENCOUNTER — Telehealth: Payer: Self-pay

## 2019-08-18 NOTE — Telephone Encounter (Signed)
Transition Care Management Follow-up Telephone Call   Date discharged? 08/17/19   How have you been since you were released from the hospital? Patient states, "I am better today and pain is subsiding, feels better to the touch." Denies dizziness, nausea, vomiting, diarrhea. Drinking plenty of fluids. Denies redness to site.    Do you understand why you were in the hospital? Yes.   Do you understand the discharge instructions? Yes, increase activity slowly.    Where were you discharged to? Home.   Items Reviewed:  Medications reviewed: Yes, taking all scheduled medications as directed. Added Levofloxacin 500mg  daily x4 days. Added Flagyl 500mg  every 8 hours x5 days.   Allergies reviewed: Yes, none new.   Dietary changes reviewed: Yes, Full liquids for 11/24 &11/25, soft bland diet for 11/26, then regular heart healthy diet after.  Referrals reviewed: Yes.   Functional Questionnaire:   Activities of Daily Living (ADLs):   He states they are independent in the following: Independent in ADLs. States they require assistance with the following: Ambulates with cane.    Any transportation issues/concerns?: None at this time.    Any patient concerns? Patient was concerned he did not receive correct dose of Flagyl post discharge. Written for start date 11/25 take 2 tablets, 3 times daily for 5 days. Received 26 tablets. Nurse called pharmacy to verify. Pharmacy confirmed with nurse they spoke with hospitalist on 11/24 that 26 count is correct. Patient made aware.     Confirmed importance and date/time of follow-up visits scheduled Yes, appointment scheduled 08/25/19 @ 11:00 via doxy [phone].   Provider Appointment booked with Dr. Derrel Nip, pcp.   Confirmed with patient if condition begins to worsen call PCP or go to the ER.  Patient was given the office number and encouraged to call back with question or concerns.  : Yes.

## 2019-08-23 ENCOUNTER — Other Ambulatory Visit: Payer: Self-pay

## 2019-08-23 ENCOUNTER — Ambulatory Visit
Admission: RE | Admit: 2019-08-23 | Discharge: 2019-08-23 | Disposition: A | Discharge status: Home / Self Care | Payer: Medicare Other | Source: Ambulatory Visit | Attending: Nurse Practitioner | Admitting: Nurse Practitioner

## 2019-08-23 DIAGNOSIS — Z122 Encounter for screening for malignant neoplasm of respiratory organs: Secondary | ICD-10-CM | POA: Insufficient documentation

## 2019-08-23 DIAGNOSIS — I712 Thoracic aortic aneurysm, without rupture: Secondary | ICD-10-CM | POA: Diagnosis not present

## 2019-08-23 DIAGNOSIS — J439 Emphysema, unspecified: Secondary | ICD-10-CM | POA: Diagnosis not present

## 2019-08-23 DIAGNOSIS — Z87891 Personal history of nicotine dependence: Secondary | ICD-10-CM | POA: Insufficient documentation

## 2019-08-23 DIAGNOSIS — R918 Other nonspecific abnormal finding of lung field: Secondary | ICD-10-CM | POA: Diagnosis not present

## 2019-08-23 DIAGNOSIS — I700 Atherosclerosis of aorta: Secondary | ICD-10-CM | POA: Diagnosis not present

## 2019-08-23 DIAGNOSIS — R911 Solitary pulmonary nodule: Secondary | ICD-10-CM | POA: Insufficient documentation

## 2019-08-25 ENCOUNTER — Encounter: Payer: Self-pay | Admitting: Internal Medicine

## 2019-08-25 ENCOUNTER — Ambulatory Visit (INDEPENDENT_AMBULATORY_CARE_PROVIDER_SITE_OTHER): Payer: Medicare Other | Admitting: Internal Medicine

## 2019-08-25 VITALS — HR 78 | Ht 76.0 in | Wt 230.0 lb

## 2019-08-25 DIAGNOSIS — E785 Hyperlipidemia, unspecified: Secondary | ICD-10-CM | POA: Diagnosis not present

## 2019-08-25 DIAGNOSIS — E538 Deficiency of other specified B group vitamins: Secondary | ICD-10-CM | POA: Diagnosis not present

## 2019-08-25 DIAGNOSIS — R918 Other nonspecific abnormal finding of lung field: Secondary | ICD-10-CM

## 2019-08-25 DIAGNOSIS — K5792 Diverticulitis of intestine, part unspecified, without perforation or abscess without bleeding: Secondary | ICD-10-CM | POA: Diagnosis not present

## 2019-08-25 DIAGNOSIS — E786 Lipoprotein deficiency: Secondary | ICD-10-CM | POA: Diagnosis not present

## 2019-08-25 DIAGNOSIS — Z09 Encounter for follow-up examination after completed treatment for conditions other than malignant neoplasm: Secondary | ICD-10-CM | POA: Diagnosis not present

## 2019-08-25 NOTE — Progress Notes (Signed)
Virtual Visit via Telephone  Note  This visit type was conducted due to national recommendations for restrictions regarding the COVID-19 pandemic (e.g. social distancing).  This format is felt to be most appropriate for this patient at this time.  All issues noted in this document were discussed and addressed.  No physical exam was performed (except for noted visual exam findings with Video Visits).   I connected with@ on 08/25/19 at 11:00 AM EST by telephone and verified that I am speaking with the correct person using two identifiers. Location patient: home Location provider: work or home office Persons participating in the virtual visit: patient, provider  I discussed the limitations, risks, security and privacy concerns of performing an evaluation and management service by telephone and the availability of in person appointments. I also discussed with the patient that there may be a patient responsible charge related to this service. The patient expressed understanding and agreed to proceed.  Reason for visit: hospital  follow up HPI:  Admitted nov 23 to [facility]  with right lower quadrant pain and diarrhea,  Concerning for appendicitis   Admitted and CT Scan was done and confirmed no evidence  of appendicitis  .  COVOD TEST NEGATIVE. Treated with IV fluids and ABX and sent home on Nov 25 in improved condition .  Levaquin and Flagyl prescribed, along with  liquid diet instructions.  He feels generally better ,  And denies ongoing abdominal pain despite transitioning to full liquid diet. Denies fevers and diarrhea. Stools have been chronically soft since his cholecystectomy in 2008.  CT SCAN Reviewed with patient , including the enlarging lung nodule seen in the LUL now measuirng 7.2 mm on repeat lung CT done Nov 30  .    ROS: See pertinent positives and negatives per HPI.  Past Medical History:  Diagnosis Date  . Arthritis   . Chicken pox   . COPD (chronic obstructive pulmonary disease)  ([place])   . Pulmonary emboli ([place])    on Xarelto    Past Surgical History:  Procedure Laterality Date  . CHOLECYSTECTOMY  2008  . TONSILLECTOMY AND ADENOIDECTOMY  1947    Family History  Problem Relation Age of Onset  . Arthritis Mother   . Lung cancer Brother        was a smoker  . Breast cancer Maternal Grandmother   . Heart disease Maternal Grandfather   . Asthma Father   . Emphysema Father   . Heart disease Paternal Grandfather     SOCIAL HX:  reports that he quit smoking about 6 years ago. His smoking use included cigarettes. He has a 59.00 pack-year smoking history. He has never used smokeless tobacco. He reports current alcohol use of about 2.0 standard drinks of alcohol per week. He reports that he does not use drugs.   Current Outpatient Medications:  .  albuterol (VENTOLIN HFA) 108 (90 Base) MCG/ACT inhaler, Inhale 2 puffs into the lungs every 6 (six) hours as needed for wheezing. Reported on 02/28/2016, Disp: 3.7 g, Rfl: 5 .  Cobalamin Combinations (B-12) 1000-400 MCG SUBL, Place 1,000 mcg under the tongue daily., Disp: 90 tablet, Rfl: 3 .  ELIQUIS 5 MG TABS tablet, TAKE 1 TABLET BY MOUTH TWICE A DAY, Disp: 180 tablet, Rfl: 1 .  simvastatin (ZOCOR) 20 MG tablet, Take 1 tablet (20 mg total) by mouth at bedtime., Disp: 90 tablet, Rfl: 1 .  umeclidinium bromide (INCRUSE ELLIPTA) 62.5 MCG/INH AEPB, Inhale 1 puff into the lungs daily., Disp:  30 each, Rfl: 11 .  VITAMIN D, CHOLECALCIFEROL, PO, Take 5,000 Units by mouth daily., Disp: , Rfl:   EXAM:  VITALS per patient if applicable:   General impression: alert, cooperative and articulate.  No signs of being in distress  Lungs: speech is fluent sentence length suggests that patient is not short of breath and not punctuated by cough, sneezing or sniffing. Marland Kitchen   Psych: affect normal.  speech is articulate and non pressured .  Denies suicidal thoughts   ASSESSMENT AND PLAN:  Discussed the following assessment and plan:  B12  deficiency - Plan: Intrinsic Factor Antibodies, Vitamin B12  Hyperlipidemia with low HDL - Plan: Comprehensive metabolic panel  Acute diverticulitis  Multiple pulmonary nodules determined by computed tomography of lung  Hospital discharge follow-up  Acute diverticulitis Resolved clinically based on progress since discharge.  Advance diet as tolerated.    Multiple pulmonary nodules determined by computed tomography of lung With a progressively enlarging sub centimeter nodule in the LUL.   Pulmonology consult in progress   Hospital discharge follow-up Patient is stable post discharge and has no new issues or questions about discharge plans at the visit today for hospital follow up. All labs , imaging studies and progress notes from admission were reviewed with patient today      I discussed the assessment and treatment plan with the patient. The patient was provided an opportunity to ask questions and all were answered. The patient agreed with the plan and demonstrated an understanding of the instructions.   The patient was advised to call back or seek an in-person evaluation if the symptoms worsen or if the condition fails to improve as anticipated.  I provided  25 minutes of non-face-to-face time during this encounter reviewing patient's current problems and post surgeries.  Providing counseling on the above mentioned problems , and coordination  of care .  [name], MD

## 2019-08-26 ENCOUNTER — Telehealth: Payer: Self-pay | Admitting: *Deleted

## 2019-08-26 NOTE — Telephone Encounter (Signed)
Notified patient of LDCT lung cancer screening program results with recommendation for 3 month follow up imaging. Also notified of incidental findings noted below and is encouraged to discuss further with PCP who will receive a copy of this note and/or the CT report. Patient verbalizes understanding.   IMPRESSION: 1. Continued enlargement of the left upper lobe nodule now 7.2 mm. Lung-RADS 4A, suspicious. Follow up low-dose chest CT without contrast in 3 months (please use the following order, "CT CHEST LCS NODULE FOLLOW-UP W/O CM") is recommended. Alternatively, PET may be considered when there is a solid component 61mm or larger. 2. 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 Recommend annual imaging followup by CTA or MRA. This recommendation follows 2010 ACCF/AHA/AATS/ACR/ASA/SCA/SCAI/SIR/STS/SVM Guidelines for the Diagnosis and Management of Patients with Thoracic Aortic Disease. Circulation. 2010; [id number]. Aortic aneurysm NOS (ICD10-I71.9) 3.  Emphysema. (ICD10-J43.9) 4.  Aortic Atherosclerois (ICD10-170.0)

## 2019-08-28 NOTE — Assessment & Plan Note (Signed)
Patient is stable post discharge and has no new issues or questions about discharge plans at the visit today for hospital follow up. All labs , imaging studies and progress notes from admission were reviewed with patient today   

## 2019-08-28 NOTE — Assessment & Plan Note (Signed)
With a progressively enlarging sub centimeter nodule in the LUL.   Pulmonology consult in progress

## 2019-08-28 NOTE — Assessment & Plan Note (Signed)
Resolved clinically based on progress since discharge.  Advance diet as tolerated.

## 2019-08-30 ENCOUNTER — Other Ambulatory Visit (INDEPENDENT_AMBULATORY_CARE_PROVIDER_SITE_OTHER): Payer: Self-pay | Admitting: Vascular Surgery

## 2019-08-30 DIAGNOSIS — Z95828 Presence of other vascular implants and grafts: Secondary | ICD-10-CM

## 2019-08-30 DIAGNOSIS — Z86711 Personal history of pulmonary embolism: Secondary | ICD-10-CM

## 2019-08-31 ENCOUNTER — Ambulatory Visit (INDEPENDENT_AMBULATORY_CARE_PROVIDER_SITE_OTHER): Payer: Medicare Other | Admitting: Vascular Surgery

## 2019-08-31 ENCOUNTER — Ambulatory Visit (INDEPENDENT_AMBULATORY_CARE_PROVIDER_SITE_OTHER): Payer: Medicare Other

## 2019-08-31 ENCOUNTER — Other Ambulatory Visit: Payer: Self-pay

## 2019-08-31 ENCOUNTER — Encounter (INDEPENDENT_AMBULATORY_CARE_PROVIDER_SITE_OTHER): Payer: Self-pay | Admitting: Vascular Surgery

## 2019-08-31 VITALS — BP 130/85 | HR 86 | Resp 16 | Ht 76.0 in | Wt 236.0 lb

## 2019-08-31 DIAGNOSIS — E786 Lipoprotein deficiency: Secondary | ICD-10-CM

## 2019-08-31 DIAGNOSIS — Z86711 Personal history of pulmonary embolism: Secondary | ICD-10-CM

## 2019-08-31 DIAGNOSIS — E785 Hyperlipidemia, unspecified: Secondary | ICD-10-CM

## 2019-08-31 DIAGNOSIS — J432 Centrilobular emphysema: Secondary | ICD-10-CM | POA: Diagnosis not present

## 2019-08-31 DIAGNOSIS — I2699 Other pulmonary embolism without acute cor pulmonale: Secondary | ICD-10-CM

## 2019-08-31 DIAGNOSIS — Z95828 Presence of other vascular implants and grafts: Secondary | ICD-10-CM

## 2019-08-31 DIAGNOSIS — I87009 Postthrombotic syndrome without complications of unspecified extremity: Secondary | ICD-10-CM | POA: Diagnosis not present

## 2019-08-31 NOTE — Assessment & Plan Note (Signed)
Remains on anticoagulation with filter in place.

## 2019-08-31 NOTE — Assessment & Plan Note (Signed)
We performed a duplex today which showed his IVC filter remains patent and there is no thrombus or stenosis in the iliac veins or the IVC.  I think checking this every other year with duplex remains a reasonable option.

## 2019-08-31 NOTE — Progress Notes (Signed)
MRN : [id number]  Seth PECORE Sr. is a 78 y.o. (1941/01/07) male who presents with chief complaint of  Chief Complaint  Patient presents with   Follow-up    ultrasound  .  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  He had to have Unna boots earlier this year, but his legs are now healed and his swelling although still significant is under much better control with compression stockings.  No recurrent ulceration or weeping from the legs. We performed a duplex today which showed his IVC filter remains patent and there is no thrombus or stenosis in the iliac veins or the IVC.  Current Outpatient Medications  Medication Sig Dispense Refill   albuterol (VENTOLIN HFA) 108 (90 Base) MCG/ACT inhaler Inhale 2 puffs into the lungs every 6 (six) hours as needed for wheezing. Reported on 02/28/2016 3.7 g 5   Cobalamin Combinations (B-12) 1000-400 MCG SUBL Place 1,000 mcg under the tongue daily. 90 tablet 3   ELIQUIS 5 MG TABS tablet TAKE 1 TABLET BY MOUTH TWICE A DAY 180 tablet 1   simvastatin (ZOCOR) 20 MG tablet Take 1 tablet (20 mg total) by mouth at bedtime. 90 tablet 1   umeclidinium bromide (INCRUSE ELLIPTA) 62.5 MCG/INH AEPB Inhale 1 puff into the lungs daily. 30 each 11   VITAMIN D, CHOLECALCIFEROL, PO Take 5,000 Units by mouth daily.     Triamcinolone Acetonide 0.025 % LOTN APPLY TOPICALLY TO AFFECTED AREA 3 TIMES DAILY FOR 7 DAYS     No current facility-administered medications for this visit.     Past Medical History:  Diagnosis Date   Arthritis    Chicken pox    COPD (chronic obstructive pulmonary disease) ([place])    Pulmonary emboli (HCC)    on Xarelto    Past Surgical History:  Procedure Laterality Date   CHOLECYSTECTOMY  2008   TONSILLECTOMY AND ADENOIDECTOMY  1947     Social History   Tobacco Use   Smoking status: Former  Smoker    Packs/day: 1.00    Years: 59.00    Pack years: 59.00    Types: Cigarettes    Quit date: 10/12/2012    Years since quitting: 6.8   Smokeless tobacco: Never Used  Substance Use Topics   Alcohol use: Yes    Alcohol/week: 2.0 standard drinks    Types: 2 Standard drinks or equivalent per week   Drug use: No     Family History  Problem Relation Age of Onset   Arthritis Mother    Lung cancer Brother        was a smoker   Breast cancer Maternal Grandmother    Heart disease Maternal Grandfather    Asthma Father    Emphysema Father    Heart disease Paternal Grandfather     Allergies  Allergen Reactions   Adhesive [Tape] Rash    Review of Systems: Negative Unless Checked Constitutional: [] ?Weight loss[] ?Fever[] ?Chills Cardiac:[] ?Chest pain[] ? Atrial Fibrillation[] ?Palpitations [] ?Shortness of breath when laying flat [] ?Shortness of breath with exertion. [] ?Shortness of breath at rest Vascular: [] ?Pain in legs with walking[] ?Pain in legswith standing[] ?Pain in legs when laying flat [] ?Claudication  [] ?Pain in feet when laying flat [x] ?History of DVT [x] ?Phlebitis [x] ?Swelling in legs [] ?Varicose veins [] ?Non-healing ulcers Pulmonary: [] ?Uses home oxygen [] ?Productive cough[] ?Hemoptysis [] ?Wheeze [x] ?COPD [] ?Asthma Neurologic: [] ?Dizziness[] ?Seizures [] ?Blackouts[] ?History of stroke [] ?History of TIA[] ?Aphasia [] ?Temporary Blindness[] ?Weaknessor numbness in arm [] ?Weakness  or numbnessin leg Musculoskeletal:[] ?Joint swelling [] ?Joint pain [] ?Low back pain  [] ? History of Knee Replacement [x] ?Arthritis [] ?back Surgeries[] ? Spinal Stenosis  Hematologic:[] ?Easy bruising[] ?Easy bleeding [] ?Hypercoagulable state [] ?Anemic Gastrointestinal:[] ?Diarrhea [] ?Vomiting[] ?Gastroesophageal reflux/heartburn[] ?Difficulty swallowing. [] ?Abdominal pain Genitourinary: [] ?Chronic kidney disease  [] ?Difficulturination [] ?Anuric[] ?Blood in urine [] ?Frequenturination [] ?Burning with urination[] ?Hematuria Skin: [] ?Rashes [] ?Ulcers [] ?Wounds Psychological: [] ?History of anxiety[] ?History of major depression  [] ? Memory Difficulties  Physical Examination  BP 130/85 (BP Location: Right Arm)    Pulse 86    Resp 16    Ht 6\' 4"  (1.93 m)    Wt 236 lb (107 kg)    BMI 28.73 kg/m  Gen:  WD/WN, NAD Head: [place]/AT, No temporalis wasting. Ear/Nose/Throat: Hearing grossly intact, nares w/o erythema or drainage Eyes: Conjunctiva clear. Sclera non-icteric Neck: Supple.  Trachea midline Pulmonary:  Good air movement, no use of accessory muscles.  Cardiac: RRR, no JVD Vascular:  Vessel Right Left  Radial Palpable Palpable                          PT  not palpable  not palpable  DP  trace palpable  not palpable    Musculoskeletal: M/S 5/5 throughout.  No deformity or atrophy.  1-2+ right lower extremity edema, 2+ left lower extremity edema. Neurologic: Sensation grossly intact in extremities.  Symmetrical.  Speech is fluent.  Psychiatric: Judgment intact, Mood & affect appropriate for pt's clinical situation. Dermatologic: No rashes or ulcers noted.  No cellulitis or open wounds.       Labs Recent Results (from the past 2160 hour(s))  Lipid panel     Status: None   Collection Time: 08/13/19 10:23 AM  Result Value Ref Range   Cholesterol 136 0 - 200 mg/dL    Comment: ATP III Classification       Desirable:  < 200 mg/dL               Borderline High:  200 - 239 mg/dL          High:  > = 240 mg/dL   Triglycerides 115.0 0.0 - 149.0 mg/dL    Comment: Normal:  <150 mg/dLBorderline High:  150 - 199 mg/dL   HDL 39.40 >39.00 mg/dL   VLDL 23.0 0.0 - 40.0 mg/dL   LDL Cholesterol 74 0 - 99 mg/dL   Total CHOL/HDL Ratio 3     Comment:                Men          Women1/2 Average Risk     3.4          3.3Average Risk          5.0          4.42X Average Risk          9.6           7.13X Average Risk          15.0          11.0                       NonHDL 96.64     Comment: NOTE:  Non-HDL goal should be 30 mg/dL higher than patient's LDL goal (i.e. LDL goal of < 70 mg/dL, would have non-HDL goal of < 100 mg/dL)  CBC with Differential/Platelet     Status: None   Collection Time: 08/13/19 10:23 AM  Result Value Ref Range   WBC  7.7 4.0 - 10.5 K/uL   RBC 4.86 4.22 - 5.81 Mil/uL   Hemoglobin 15.0 13.0 - 17.0 g/dL   HCT 44.6 39.0 - 52.0 %   MCV 91.9 78.0 - 100.0 fl   MCHC 33.5 30.0 - 36.0 g/dL   RDW 13.1 11.5 - 15.5 %   Platelets 154.0 150.0 - 400.0 K/uL   Neutrophils Relative % 49.6 43.0 - 77.0 %   Lymphocytes Relative 39.7 12.0 - 46.0 %   Monocytes Relative 8.6 3.0 - 12.0 %   Eosinophils Relative 1.6 0.0 - 5.0 %   Basophils Relative 0.5 0.0 - 3.0 %   Neutro Abs 3.8 1.4 - 7.7 K/uL   Lymphs Abs 3.1 0.7 - 4.0 K/uL   Monocytes Absolute 0.7 0.1 - 1.0 K/uL   Eosinophils Absolute 0.1 0.0 - 0.7 K/uL   Basophils Absolute 0.0 0.0 - 0.1 K/uL  Comprehensive metabolic panel     Status: None   Collection Time: 08/13/19 10:23 AM  Result Value Ref Range   Sodium 140 135 - 145 mEq/L   Potassium 4.3 3.5 - 5.1 mEq/L   Chloride 104 96 - 112 mEq/L   CO2 27 19 - 32 mEq/L   Glucose, Bld 91 70 - 99 mg/dL   BUN 14 6 - 23 mg/dL   Creatinine, Ser 0.82 0.40 - 1.50 mg/dL   Total Bilirubin 0.9 0.2 - 1.2 mg/dL   Alkaline Phosphatase 64 39 - 117 U/L   AST 18 0 - 37 U/L   ALT 17 0 - 53 U/L   Total Protein 6.8 6.0 - 8.3 g/dL   Albumin 4.1 3.5 - 5.2 g/dL   GFR 90.54 >60.00 mL/min   Calcium 9.1 8.4 - 10.5 mg/dL  VITAMIN D 25 Hydroxy (Vit-D Deficiency, Fractures)     Status: None   Collection Time: 08/13/19 10:23 AM  Result Value Ref Range   VITD 53.04 30.00 - 100.00 ng/mL  Vitamin B12     Status: None   Collection Time: 08/13/19 10:23 AM  Result Value Ref Range   Vitamin B-12 241 211 - 911 pg/mL  Urinalysis, Complete w Microscopic     Status: Abnormal   Collection Time: 08/16/19   6:12 PM  Result Value Ref Range   Color, Urine YELLOW (A) YELLOW   APPearance CLEAR (A) CLEAR   Specific Gravity, Urine 1.018 1.005 - 1.030   pH 6.0 5.0 - 8.0   Glucose, UA NEGATIVE NEGATIVE mg/dL   Hgb urine dipstick MODERATE (A) NEGATIVE   Bilirubin Urine NEGATIVE NEGATIVE   Ketones, ur NEGATIVE NEGATIVE mg/dL   Protein, ur NEGATIVE NEGATIVE mg/dL   Nitrite NEGATIVE NEGATIVE   Leukocytes,Ua NEGATIVE NEGATIVE   RBC / HPF 21-50 0 - 5 RBC/hpf   WBC, UA 0-5 0 - 5 WBC/hpf   Bacteria, UA NONE SEEN NONE SEEN   Squamous Epithelial / LPF NONE SEEN 0 - 5   Mucus PRESENT    Hyaline Casts, UA PRESENT     Comment: Performed at [facility], [address] [place], [place] [address]  Lipase, blood     Status: None   Collection Time: 08/16/19  6:22 PM  Result Value Ref Range   Lipase 22 11 - 51 U/L    Comment: Performed at [facility], [address] [place], [place] [address]  Comprehensive metabolic panel     Status: Abnormal   Collection Time: 08/16/19  6:22 PM  Result Value Ref Range   Sodium 139 135 -  145 mmol/L   Potassium 4.1 3.5 - 5.1 mmol/L   Chloride 102 98 - 111 mmol/L   CO2 25 22 - 32 mmol/L   Glucose, Bld 92 70 - 99 mg/dL   BUN 13 8 - 23 mg/dL   Creatinine, Ser 0.99 0.61 - 1.24 mg/dL   Calcium 9.1 8.9 - 10.3 mg/dL   Total Protein 7.5 6.5 - 8.1 g/dL   Albumin 4.0 3.5 - 5.0 g/dL   AST 18 15 - 41 U/L   ALT 17 0 - 44 U/L   Alkaline Phosphatase 63 38 - 126 U/L   Total Bilirubin 1.9 (H) 0.3 - 1.2 mg/dL   GFR calc non Af Amer >60 >60 mL/min   GFR calc Af Amer >60 >60 mL/min   Anion gap 12 5 - 15    Comment: Performed at [facility], [address] [place], [place] [address]  CBC     Status: Abnormal   Collection Time: 08/16/19  6:22 PM  Result Value Ref Range   WBC 12.7 (H) 4.0 - 10.5 K/uL   RBC 5.09 4.22 - 5.81 MIL/uL   Hemoglobin 15.3 13.0 - 17.0 g/dL   HCT 44.3 39.0 - 52.0 %   MCV 87.0 80.0 - 100.0 fL   MCH 30.1 26.0 - 34.0 pg    MCHC 34.5 30.0 - 36.0 g/dL   RDW 12.5 11.5 - 15.5 %   Platelets 154 150 - 400 K/uL   nRBC 0.0 0.0 - 0.2 %    Comment: Performed at [facility], [address] [place], [place] [address]  SARS CORONAVIRUS 2 (TAT 6-24 HRS) Nasopharyngeal Nasopharyngeal Swab     Status: None   Collection Time: 08/16/19 10:00 PM   Specimen: Nasopharyngeal Swab  Result Value Ref Range   SARS Coronavirus 2 NEGATIVE NEGATIVE    Comment: (NOTE) SARS-CoV-2 target nucleic acids are NOT DETECTED. The SARS-CoV-2 RNA is generally detectable in upper and lower respiratory specimens during the acute phase of infection. Negative results do not preclude SARS-CoV-2 infection, do not rule out co-infections with other pathogens, and should not be used as the sole basis for treatment or other patient management decisions. Negative results must be combined with clinical observations, patient history, and epidemiological information. The expected result is Negative. Fact Sheet for Patients: SugarRoll.be Fact Sheet for Healthcare Providers: https://www.woods-mathews.com/ This test is not yet approved or cleared by the [place] FDA and  has been authorized for detection and/or diagnosis of SARS-CoV-2 by FDA under an Emergency Use Authorization (EUA). This EUA will remain  in effect (meaning this test can be used) for the duration of the COVID-19 declaration under Section 56 4(b)(1) of the Act, 21 U.S.C. section 360bbb-3(b)(1), unless the authorization is terminated or revoked sooner. Performed at [facility], [place] [address], [place], [place] [id number]   Basic metabolic panel     Status: Abnormal   Collection Time: 08/17/19  4:31 AM  Result Value Ref Range   Sodium 140 135 - 145 mmol/L   Potassium 3.8 3.5 - 5.1 mmol/L   Chloride 105 98 - 111 mmol/L   CO2 26 22 - 32 mmol/L   Glucose, Bld 94 70 - 99 mg/dL   BUN 15 8 - 23 mg/dL   Creatinine, Ser 1.03 0.61  - 1.24 mg/dL   Calcium 8.7 (L) 8.9 - 10.3 mg/dL   GFR calc non Af Amer >60 >60 mL/min   GFR calc Af Amer >60 >60 mL/min  Anion gap 9 5 - 15    Comment: Performed at [facility], [address] [place], [place] [address]  CBC     Status: Abnormal   Collection Time: 08/17/19  4:31 AM  Result Value Ref Range   WBC 9.3 4.0 - 10.5 K/uL   RBC 4.66 4.22 - 5.81 MIL/uL   Hemoglobin 14.1 13.0 - 17.0 g/dL   HCT 40.9 39.0 - 52.0 %   MCV 87.8 80.0 - 100.0 fL   MCH 30.3 26.0 - 34.0 pg   MCHC 34.5 30.0 - 36.0 g/dL   RDW 12.7 11.5 - 15.5 %   Platelets 149 (L) 150 - 400 K/uL   nRBC 0.0 0.0 - 0.2 %    Comment: Performed at [facility], [address] [place], [place] [address]    Radiology Ct Abdomen Pelvis W Contrast  Result Date: 08/16/2019 CLINICAL DATA:  Right lower quadrant pain. Symptoms began this morning. EXAM: CT ABDOMEN AND PELVIS WITH CONTRAST TECHNIQUE: Multidetector CT imaging of the abdomen and pelvis was performed using the standard protocol following bolus administration of intravenous contrast. CONTRAST:  15mL OMNIPAQUE IOHEXOL 300 MG/ML  SOLN COMPARISON:  05/15/2007 abdominal ultrasound.  No comparison CT. FINDINGS: Lower chest: 3 mm right middle lobe pulmonary nodule on 01/04. There is also a right middle lobe calcified granuloma. Left lower lobe volume loss. Cardiomegaly. Left hemidiaphragm elevation. Hepatobiliary: Normal liver. Cholecystectomy, without biliary ductal dilatation. Pancreas: Mild pancreatic atrophy, without duct dilatation or acute inflammation. Spleen: Normal in size, without focal abnormality. Adrenals/Urinary Tract: Normal adrenal glands. A lower pole exophytic right renal 4.9 cm cyst. Normal left kidney, without hydronephrosis. Normal urinary bladder. Stomach/Bowel: Normal stomach, without wall thickening. There is a loop of thickened sigmoid colon with surrounding edema in the region of diverticula of the right lower abdomen including on 47/2  and 23 coronal. No free perforation or drainable surrounding fluid collection. Normal terminal ileum and appendix.  Normal small bowel. Vascular/Lymphatic: IVC filter. Aortic and branch vessel atherosclerosis. 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 Small abdominal retroperitoneal nodes. None pathologic by size criteria. No pelvic sidewall adenopathy. Reproductive: Mild prostatomegaly. Other: Small volume cul-de-sac fluid. Right paramidline tiny fat containing ventral abdominal wall hernia. Musculoskeletal: Degenerative partial fusion of the bilateral sacroiliac joints. Lumbosacral spondylosis. IMPRESSION: 1. Edema centered about the sigmoid colon, favored to represent non complicated moderate diverticulitis. Given the focality of the abnormality, consider follow-up colonoscopy to exclude less likely superinfection of a carcinoma. 2. Normal appendix. 3. Aortic Atherosclerosis (ICD10-I70.0). 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 4. Small volume cul-de-sac fluid, likely secondary. 5. Right paramidline fat containing ventral abdominal wall hernia. 6. Right middle lobe pulmonary nodule. No follow-up needed if patient is low-risk. Non-contrast chest CT can be considered in 12 months if patient is high-risk. This recommendation follows the consensus statement: Guidelines for Management of Incidental Pulmonary Nodules Detected on CT Images: From the Fleischner Society 2017; Radiology 2017; 284:228-243. Electronically Signed   By: Abigail Miyamoto M.D.   On: 08/16/2019 19:54   Ct Chest Lcs Nodule F/u W/o Contrast  Result Date: 08/24/2019 CLINICAL DATA:  78 year old male with 59 pack year history of smoking. Lung cancer screening. EXAM: CT CHEST WITHOUT CONTRAST FOR LUNG CANCER SCREENING NODULE FOLLOW-UP TECHNIQUE: Multidetector CT imaging of the chest was performed following the standard protocol without IV contrast. COMPARISON:  05/19/2019. FINDINGS: Cardiovascular: Cardiopericardial  silhouette is at upper limits of normal for size. Coronary  artery calcification is evident. Atherosclerotic calcification is noted in the wall of the thoracic aorta. Ascending thoracic aorta measures 4 cm diameter. Mediastinum/Nodes: No mediastinal lymphadenopathy. No evidence for gross hilar lymphadenopathy although assessment is limited by the lack of intravenous contrast on today's study. The esophagus has normal imaging features. There is no axillary lymphadenopathy. Lungs/Pleura: Centrilobular emphsyema noted. Scattered tiny centrilobular ground-glass nodules with upper lung predominance compatible with smoking related lung disease. Left upper lobe pulmonary nodule shows continued further progression, now measuring 7.2 mm. Upper Abdomen: Unremarkable. Musculoskeletal: No worrisome lytic or sclerotic osseous abnormality. Degenerative changes are noted in both shoulders. IMPRESSION: 1. Continued enlargement of the left upper lobe nodule now 7.2 mm. Lung-RADS 4A, suspicious. Follow up low-dose chest CT without contrast in 3 months (please use the following order, "CT CHEST LCS NODULE FOLLOW-UP W/O CM") is recommended. Alternatively, PET may be considered when there is a solid component 5mm or larger. 2. 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 Recommend annual imaging followup by CTA or MRA. This recommendation follows 2010 ACCF/AHA/AATS/ACR/ASA/SCA/SCAI/SIR/STS/SVM Guidelines for the Diagnosis and Management of Patients with Thoracic Aortic Disease. Circulation. 2010; [id number]. Aortic aneurysm NOS (ICD10-I71.9) 3.  Emphysema. (ICD10-J43.9) 4.  Aortic Atherosclerois (ICD10-170.0) Electronically Signed   By: Misty Stanley M.D.   On: 08/24/2019 09:06    Assessment/Plan Hyperlipidemia with low HDL lipid control important in reducing the progression of atherosclerotic disease. Continue statin therapy   COPD (chronic obstructive pulmonary disease) with emphysema (HCC) Continue pulmonary medications  and aerosols as already ordered, these medications have been reviewed and there are no changes at this time.    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    Had ulceration earlier this year that we got healed with Unna boots.  Continue compression stockings.  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  S/P insertion of IVC (inferior vena caval) filter We performed a duplex today which showed his IVC filter remains patent and there is no thrombus or stenosis in the iliac veins or the IVC.  I think checking this every other year with duplex remains a reasonable option.    Leotis Pain, MD  08/31/2019 10:53 AM    This note was created with Dragon medical transcription system.  Any errors from dictation are purely unintentional

## 2019-09-10 ENCOUNTER — Other Ambulatory Visit: Payer: Self-pay | Admitting: Internal Medicine

## 2019-10-04 ENCOUNTER — Ambulatory Visit: Payer: Medicare Other | Admitting: Pulmonary Disease

## 2019-10-14 ENCOUNTER — Other Ambulatory Visit: Payer: Self-pay | Admitting: Internal Medicine

## 2019-10-20 ENCOUNTER — Encounter: Payer: Self-pay | Admitting: Pulmonary Disease

## 2019-10-20 ENCOUNTER — Other Ambulatory Visit: Payer: Self-pay

## 2019-10-20 ENCOUNTER — Ambulatory Visit: Payer: Medicare Other | Admitting: Pulmonary Disease

## 2019-10-20 VITALS — BP 130/72 | HR 63 | Temp 97.0°F | Ht 76.0 in | Wt 248.4 lb

## 2019-10-20 DIAGNOSIS — R911 Solitary pulmonary nodule: Secondary | ICD-10-CM

## 2019-10-20 DIAGNOSIS — J449 Chronic obstructive pulmonary disease, unspecified: Secondary | ICD-10-CM

## 2019-10-20 NOTE — Patient Instructions (Signed)
We will see her in follow-up in 6 months time  Burgess Estelle, RN is arranging for your follow-up CT.  We will review that CT when available  The nodule that is currently present is too small for biopsy at this time.  We will have to follow-up as above.

## 2019-11-20 ENCOUNTER — Telehealth: Payer: Self-pay | Admitting: *Deleted

## 2019-11-20 DIAGNOSIS — R918 Other nonspecific abnormal finding of lung field: Secondary | ICD-10-CM

## 2019-11-20 DIAGNOSIS — Z87891 Personal history of nicotine dependence: Secondary | ICD-10-CM

## 2019-11-20 NOTE — Telephone Encounter (Signed)
Contacted and scheduled for LCS nodule follow up scan 

## 2019-11-24 ENCOUNTER — Other Ambulatory Visit: Payer: Self-pay

## 2019-11-24 ENCOUNTER — Ambulatory Visit
Admission: RE | Admit: 2019-11-24 | Discharge: 2019-11-24 | Disposition: A | Discharge status: Home / Self Care | Payer: Medicare Other | Source: Ambulatory Visit | Attending: Oncology | Admitting: Oncology

## 2019-11-24 DIAGNOSIS — R918 Other nonspecific abnormal finding of lung field: Secondary | ICD-10-CM

## 2019-11-24 DIAGNOSIS — Z87891 Personal history of nicotine dependence: Secondary | ICD-10-CM

## 2019-12-02 ENCOUNTER — Encounter: Payer: Self-pay | Admitting: *Deleted

## 2020-02-01 ENCOUNTER — Telehealth: Payer: Self-pay | Admitting: Internal Medicine

## 2020-02-01 NOTE — Telephone Encounter (Signed)
Patient dropped off a [employer] parking form to be completed by Dr. Derrel Nip. Form is up front in color folder.

## 2020-02-01 NOTE — Telephone Encounter (Signed)
Placed in quick sign folder.  

## 2020-02-01 NOTE — Telephone Encounter (Signed)
Please call [phone], patient's phone is out of order.

## 2020-02-02 NOTE — Telephone Encounter (Signed)
Left message with wife to have pt give [place] a call back. Need to let pt know that the handicap form has been completed and ready for pick up.   Form has been placed up front in file.

## 2020-04-12 ENCOUNTER — Other Ambulatory Visit: Payer: Self-pay | Admitting: Internal Medicine

## 2020-08-09 DIAGNOSIS — R600 Localized edema: Secondary | ICD-10-CM | POA: Diagnosis not present

## 2020-08-09 DIAGNOSIS — L0390 Cellulitis, unspecified: Secondary | ICD-10-CM | POA: Diagnosis not present

## 2020-08-11 ENCOUNTER — Ambulatory Visit: Payer: Medicare Other

## 2020-08-22 ENCOUNTER — Ambulatory Visit (INDEPENDENT_AMBULATORY_CARE_PROVIDER_SITE_OTHER): Payer: Medicare Other

## 2020-08-22 VITALS — Ht 76.0 in | Wt 248.0 lb

## 2020-08-22 DIAGNOSIS — Z0000 Encounter for general adult medical examination without abnormal findings: Secondary | ICD-10-CM | POA: Diagnosis not present

## 2020-08-22 NOTE — Progress Notes (Addendum)
Subjective:   Seth SCHOENFELDER Sr. is a 79 y.o. male who presents for Medicare Annual/Subsequent preventive examination.  Review of Systems    No ROS.  Medicare Wellness Virtual Visit.   Cardiac Risk Factors include: advanced age (>23men, >33 women);male gender     Objective:    Today's Vitals   08/22/20 1407  Weight: 248 lb (112.5 kg)  Height: 6\' 4"  (1.93 m)   Body mass index is 30.19 kg/m.  Advanced Directives 08/22/2020 08/17/2019 08/16/2019 08/05/2018 09/19/2017 09/19/2017 08/04/2017  Does Patient Have a Medical Advance Directive? Yes No No Yes Yes No Yes  Type of Paramedic of [place];Living will - - Living will;Healthcare Power of [place]  Does patient want to make changes to medical advance directive? No - Patient declined - - No - Patient declined No - Patient declined - No - Patient declined  Copy of [place] in Chart? No - copy requested - - No - copy requested No - copy requested - No - copy requested  Would patient like information on creating a medical advance directive? - No - Patient declined No - Patient declined - - - -    Current Medications (verified) Outpatient Encounter Medications as of 08/22/2020  Medication Sig   albuterol (VENTOLIN HFA) 108 (90 Base) MCG/ACT inhaler Inhale 2 puffs into the lungs every 6 (six) hours as needed for wheezing. Reported on 02/28/2016   Cobalamin Combinations (B-12) 1000-400 MCG SUBL Place 1,000 mcg under the tongue daily.   ELIQUIS 5 MG TABS tablet TAKE 1 TABLET BY MOUTH TWICE A DAY   simvastatin (ZOCOR) 20 MG tablet TAKE 1 TABLET BY MOUTH EVERYDAY AT BEDTIME   Triamcinolone Acetonide 0.025 % LOTN APPLY TOPICALLY TO AFFECTED AREA 3 TIMES DAILY FOR 7 DAYS   umeclidinium bromide (INCRUSE ELLIPTA) 62.5 MCG/INH AEPB Inhale 1 puff into the lungs daily.   VITAMIN D, CHOLECALCIFEROL, PO Take 5,000 Units by mouth daily.   No  facility-administered encounter medications on file as of 08/22/2020.    Allergies (verified) Adhesive [tape]   History: Past Medical History:  Diagnosis Date   Arthritis    Chicken pox    COPD (chronic obstructive pulmonary disease) (HCC)    Pulmonary emboli (HCC)    on Xarelto   Past Surgical History:  Procedure Laterality Date   CHOLECYSTECTOMY  2008   TONSILLECTOMY AND ADENOIDECTOMY  1947   Family History  Problem Relation Age of Onset   Arthritis Mother    Lung cancer Brother        was a smoker   Breast cancer Maternal Grandmother    Heart disease Maternal Grandfather    Asthma Father    Emphysema Father    Heart disease Paternal Grandfather    Social History   Socioeconomic History   Marital status: Divorced    Spouse name: Not on file   Number of children: Not on file   Years of education: Not on file   Highest education level: Not on file  Occupational History   Not on file  Tobacco Use   Smoking status: Former Smoker    Packs/day: 1.00    Years: 59.00    Pack years: 59.00    Types: Cigarettes    Quit date: 10/12/2012    Years since quitting: 7.8   Smokeless tobacco: Never Used  Vaping Use   Vaping Use: Never used  Substance and Sexual Activity  Alcohol use: Yes    Alcohol/week: 2.0 standard drinks    Types: 2 Standard drinks or equivalent per week   Drug use: No   Sexual activity: Not Currently  Other Topics Concern   Not on file  Social History Narrative   Not on file   Social Determinants of Health   Financial Resource Strain: Low Risk    Difficulty of Paying Living Expenses: Not hard at all  Food Insecurity:    Worried About Charity fundraiser in the Last Year: Not on file   [employer] of Food in the Last Year: Not on file  Transportation Needs: No Transportation Needs   Lack of Transportation (Medical): No   Lack of Transportation (Non-Medical): No  Physical Activity: Unknown   Days of Exercise per Week: 0 days   Minutes of  Exercise per Session: Not on file  Stress: No Stress Concern Present   Feeling of Stress : Not at all  Social Connections: Unknown   Frequency of Communication with Friends and Family: Not on file   Frequency of Social Gatherings with Friends and Family: Not on file   Attends Religious Services: Not on Electrical engineer or Organizations: Not on file   Attends Archivist Meetings: Not on file   Marital Status: Married    Tobacco Counseling Counseling given: Not Answered   Clinical Intake:  Pre-visit preparation completed: Yes        Diabetes: No  How often do you need to have someone help you when you read instructions, pamphlets, or other written materials from your doctor or pharmacy?: 1 - Never   Interpreter Needed?: No      Activities of Daily Living In your present state of health, do you have any difficulty performing the following activities: 08/22/2020  Hearing? Y  Comment Hearing aid  Vision? N  Difficulty concentrating or making decisions? N  Walking or climbing stairs? Y  Comment Unsteady gait. Una boot worn. Cane in use when ambulating.  Dressing or bathing? N  Doing errands, shopping? N  Preparing Food and eating ? N  Using the Toilet? N  In the past six months, have you accidently leaked urine? N  Do you have problems with loss of bowel control? N  Managing your Medications? N  Managing your Finances? N  Housekeeping or managing your Housekeeping? N  Some recent data might be hidden    Patient Care Team: [name], MD as PCP - General (Internal Medicine)  Indicate any recent Medical Services you may have received from other than [facility] providers in the past year (date may be approximate).     Assessment:   This is a routine wellness examination for Seth Perez.  I connected with Seth Perez today by telephone and verified that I am speaking with the correct person using two identifiers. Location patient: home Location  provider: work Persons participating in the virtual visit: patient, Marine scientist.    I discussed the limitations, risks, security and privacy concerns of performing an evaluation and management service by telephone and the availability of in person appointments. The patient expressed understanding and verbally consented to this telephonic visit.    Interactive audio and video telecommunications were attempted between this provider and patient, however failed, due to patient having technical difficulties OR patient did not have access to video capability.  We continued and completed visit with audio only.  Some vital signs may be absent or patient reported.  Hearing/Vision screen  Hearing Screening   125Hz  250Hz  500Hz  1000Hz  2000Hz  3000Hz  4000Hz  6000Hz  8000Hz   Right ear:           Left ear:           Comments: Hearing aid, bilateral  Vision Screening Comments: Followed by [facility]  Wears corrective lenses  Visual acuity not assessed per patient preference   Dietary issues and exercise activities discussed: Current Exercise Habits: The patient does not participate in regular exercise at present  Regular diet; portion control Fair water intake  Goals       Patient Stated     Follow up with Primary Care Provider (pt-stated)      As needed       Depression Screen PHQ 2/9 Scores 08/22/2020 08/05/2018 08/04/2017 08/02/2016 08/30/2015 08/03/2015 11/02/2012  PHQ - 2 Score 0 0 0 0 0 0 0  PHQ- 9 Score - - 0 - - - -    Fall Risk Fall Risk  08/22/2020 08/25/2019 08/11/2019 08/11/2019 08/05/2018  Falls in the past year? 1 1 1  0 0  Comment - - - - -  Number falls in past yr: 0 0 0 0 -  Comment - - - - -  Injury with Fall? 0 0 0 - -  Comment - - - - -  Risk Factor Category  - - - - -  Risk for fall due to : - - - - -  Risk for fall due to: Comment - - - - -  Follow up Falls evaluation completed Falls evaluation completed Falls evaluation completed Falls prevention discussed;Education  provided -  Comment - - - - -   Handrails in use when climbing stairs? Yes Home free of loose throw rugs in walkways, pet beds, electrical cords, etc? Yes  Adequate lighting in your home to reduce risk of falls? Yes   ASSISTIVE DEVICES UTILIZED TO PREVENT FALLS: Use of a cane, walker or w/c? Yes  Grab bars in the bathroom? Yes    Shower chair or bench in shower? Yes   Elevated toilet seat or a handicapped toilet? Yes   TIMED UP AND GO: Was the test performed? No . Virtual visit.   Cognitive Function: Patient is alert and oriented x3.  Denies difficulty focusing, making decisions, memory loss.  Enjoys research.  MMSE - Mini Mental State Exam 08/04/2017 08/02/2016 08/03/2015  Orientation to time 5 5 5   Orientation to Place 5 5 5   Registration 3 3 3   Attention/ Calculation 5 5 5   Recall 3 3 3   Language- name 2 objects 2 2 2   Language- repeat 1 1 1   Language- follow 3 step command 3 3 3   Language- read & follow direction 1 1 1   Write a sentence 1 1 1   Copy design 1 1 1   Total score 30 30 30      6CIT Screen 08/22/2020 08/11/2019 08/05/2018 08/02/2016  What Year? 0 points 0 points 0 points 0 points  What month? 0 points 0 points 0 points 0 points  What time? 0 points 0 points 0 points 0 points  Count back from 20 0 points 0 points 0 points 0 points  Months in reverse 0 points 0 points 0 points 0 points    Immunizations Immunization History  Administered Date(s) Administered   Fluad Quad(high Dose 65+) 08/13/2019   Influenza Split 06/17/2014   Influenza, High Dose Seasonal PF 08/02/2016, 06/18/2017, 08/05/2018   Influenza,inj,Quad PF,6+ Mos 08/03/2015   Moderna  SARS-COVID-2 Vaccination 12/22/2019, 01/19/2020   Pneumococcal Conjugate-13 06/17/2014   Pneumococcal Polysaccharide-23 06/18/2007, 09/29/2017   Pneumococcal-Unspecified 09/28/2006   Tdap 06/23/2014   Health Maintenance Health Maintenance  Topic Date Due   INFLUENZA VACCINE  12/21/2020 (Originally 04/23/2020)    TETANUS/TDAP  06/23/2024   COVID-19 Vaccine  Completed   PNA vac Low Risk Adult  Completed   Colorectal cancer screening: No longer required.   Lung Cancer Screening: Completed 11/24/19.   Hepatitis C Screening: does not qualify.  Vision Screening: Recommended annual ophthalmology exams for early detection of glaucoma and other disorders of the eye. Is the patient up to date with their annual eye exam?  Yes  Plans to have cataract extraction later in the season.   Dental Screening: Recommended annual dental exams for proper oral hygiene.  Community Resource Referral / Chronic Care Management: CRR required this visit?  No   CCM required this visit?  No      Plan:   Keep all routine maintenance appointments.   Encouraged to schedule with PCP for routine follow up. Declines scheduling today. Agrees to call the office back and schedule.   I have personally reviewed and noted the following in the patient's chart:   Medical and social history Use of alcohol, tobacco or illicit drugs  Current medications and supplements Functional ability and status Nutritional status Physical activity Advanced directives List of other physicians Hospitalizations, surgeries, and ER visits in previous 12 months Vitals Screenings to include cognitive, depression, and falls Referrals and appointments  In addition, I have reviewed and discussed with patient certain preventive protocols, quality metrics, and best practice recommendations. A written personalized care plan for preventive services as well as general preventive health recommendations were provided to patient via mail.     OBrien-Blaney, Jarious Lyon L, LPN   [id number]    I have reviewed the above information and agree with above.   Deborra Medina, MD

## 2020-08-22 NOTE — Patient Instructions (Addendum)
[name] , Thank you for taking time to come for your Medicare Wellness Visit. I appreciate your ongoing commitment to your health goals. Please review the following plan we discussed and let me know if I can assist you in the future.   These are the goals we discussed: Goals      Patient Stated   .  Follow up with Primary Care Provider (pt-stated)      As needed       This is a list of the screening recommended for you and due dates:  Health Maintenance  Topic Date Due  . Flu Shot  12/21/2020*  . Tetanus Vaccine  06/23/2024  . COVID-19 Vaccine  Completed  . Pneumonia vaccines  Completed  *Topic was postponed. The date shown is not the original due date.    Immunizations Immunization History  Administered Date(s) Administered  . Fluad Quad(high Dose 65+) 08/13/2019  . Influenza Split 06/17/2014  . Influenza, High Dose Seasonal PF 08/02/2016, 06/18/2017, 08/05/2018  . Influenza,inj,Quad PF,6+ Mos 08/03/2015  . Moderna SARS-COVID-2 Vaccination 12/22/2019, 01/19/2020  . Pneumococcal Conjugate-13 06/17/2014  . Pneumococcal Polysaccharide-23 06/18/2007, 09/29/2017  . Pneumococcal-Unspecified 09/28/2006  . Tdap 06/23/2014   Advanced directives: End of life planning; Advance aging; Advanced directives discussed.  Copy of current HCPOA/Living Will requested.    Conditions/risks identified: none new.   Follow up in one year for your annual wellness visit.   Preventive Care 25 Years and Older, Male Preventive care refers to lifestyle choices and visits with your health care provider that can promote health and wellness. What does preventive care include?  A yearly physical exam. This is also called an annual well check.  Dental exams once or twice a year.  Routine eye exams. Ask your health care provider how often you should have your eyes checked.  Personal lifestyle choices, including:  Daily care of your teeth and gums.  Regular physical activity.  Eating a  healthy diet.  Avoiding tobacco and drug use.  Limiting alcohol use.  Practicing safe sex.  Taking low doses of aspirin every day.  Taking vitamin and mineral supplements as recommended by your health care provider. What happens during an annual well check? The services and screenings done by your health care provider during your annual well check will depend on your age, overall health, lifestyle risk factors, and family history of disease. Counseling  Your health care provider may ask you questions about your:  Alcohol use.  Tobacco use.  Drug use.  Emotional well-being.  Home and relationship well-being.  Sexual activity.  Eating habits.  History of falls.  Memory and ability to understand (cognition).  Work and work Statistician. Screening  You may have the following tests or measurements:  Height, weight, and BMI.  Blood pressure.  Lipid and cholesterol levels. These may be checked every 5 years, or more frequently if you are over 69 years old.  Skin check.  Lung cancer screening. You may have this screening every year starting at age 48 if you have a 30-pack-year history of smoking and currently smoke or have quit within the past 15 years.  Fecal occult blood test (FOBT) of the stool. You may have this test every year starting at age 49.  Flexible sigmoidoscopy or colonoscopy. You may have a sigmoidoscopy every 5 years or a colonoscopy every 10 years starting at age 70.  Prostate cancer screening. Recommendations will vary depending on your family history and other risks.  Hepatitis C blood test.  Hepatitis B blood test.  Sexually transmitted disease (STD) testing.  Diabetes screening. This is done by checking your blood sugar (glucose) after you have not eaten for a while (fasting). You may have this done every 1-3 years.  Abdominal aortic aneurysm (AAA) screening. You may need this if you are a current or former smoker.  Osteoporosis. You may be  screened starting at age 64 if you are at high risk. Talk with your health care provider about your test results, treatment options, and if necessary, the need for more tests. Vaccines  Your health care provider may recommend certain vaccines, such as:  Influenza vaccine. This is recommended every year.  Tetanus, diphtheria, and acellular pertussis (Tdap, Td) vaccine. You may need a Td booster every 10 years.  Zoster vaccine. You may need this after age 16.  Pneumococcal 13-valent conjugate (PCV13) vaccine. One dose is recommended after age 34.  Pneumococcal polysaccharide (PPSV23) vaccine. One dose is recommended after age 46. Talk to your health care provider about which screenings and vaccines you need and how often you need them. This information is not intended to replace advice given to you by your health care provider. Make sure you discuss any questions you have with your health care provider. Document Released: 10/06/2015 Document Revised: 05/29/2016 Document Reviewed: 07/11/2015 Elsevier Interactive Patient Education  2017 [employer] Prevention in the Home Falls can cause injuries. They can happen to people of all ages. There are many things you can do to make your home safe and to help prevent falls. What can I do on the outside of my home?  Regularly fix the edges of walkways and driveways and fix any cracks.  Remove anything that might make you trip as you walk through a door, such as a raised step or threshold.  Trim any bushes or trees on the path to your home.  Use bright outdoor lighting.  Clear any walking paths of anything that might make someone trip, such as rocks or tools.  Regularly check to see if handrails are loose or broken. Make sure that both sides of any steps have handrails.  Any raised decks and porches should have guardrails on the edges.  Have any leaves, snow, or ice cleared regularly.  Use sand or salt on walking paths during  winter.  Clean up any spills in your garage right away. This includes oil or grease spills. What can I do in the bathroom?  Use night lights.  Install grab bars by the toilet and in the tub and shower. Do not use towel bars as grab bars.  Use non-skid mats or decals in the tub or shower.  If you need to sit down in the shower, use a plastic, non-slip stool.  Keep the floor dry. Clean up any water that spills on the floor as soon as it happens.  Remove soap buildup in the tub or shower regularly.  Attach bath mats securely with double-sided non-slip rug tape.  Do not have throw rugs and other things on the floor that can make you trip. What can I do in the bedroom?  Use night lights.  Make sure that you have a light by your bed that is easy to reach.  Do not use any sheets or blankets that are too big for your bed. They should not hang down onto the floor.  Have a firm chair that has side arms. You can use this for support while you get dressed.  Do not  have throw rugs and other things on the floor that can make you trip. What can I do in the kitchen?  Clean up any spills right away.  Avoid walking on wet floors.  Keep items that you use a lot in easy-to-reach places.  If you need to reach something above you, use a strong step stool that has a grab bar.  Keep electrical cords out of the way.  Do not use floor polish or wax that makes floors slippery. If you must use wax, use non-skid floor wax.  Do not have throw rugs and other things on the floor that can make you trip. What can I do with my stairs?  Do not leave any items on the stairs.  Make sure that there are handrails on both sides of the stairs and use them. Fix handrails that are broken or loose. Make sure that handrails are as long as the stairways.  Check any carpeting to make sure that it is firmly attached to the stairs. Fix any carpet that is loose or worn.  Avoid having throw rugs at the top or  bottom of the stairs. If you do have throw rugs, attach them to the floor with carpet tape.  Make sure that you have a light switch at the top of the stairs and the bottom of the stairs. If you do not have them, ask someone to add them for you. What else can I do to help prevent falls?  Wear shoes that:  Do not have high heels.  Have rubber bottoms.  Are comfortable and fit you well.  Are closed at the toe. Do not wear sandals.  If you use a stepladder:  Make sure that it is fully opened. Do not climb a closed stepladder.  Make sure that both sides of the stepladder are locked into place.  Ask someone to hold it for you, if possible.  Clearly mark and make sure that you can see:  Any grab bars or handrails.  First and last steps.  Where the edge of each step is.  Use tools that help you move around (mobility aids) if they are needed. These include:  Canes.  Walkers.  Scooters.  Crutches.  Turn on the lights when you go into a dark area. Replace any light bulbs as soon as they burn out.  Set up your furniture so you have a clear path. Avoid moving your furniture around.  If any of your floors are uneven, fix them.  If there are any pets around you, be aware of where they are.  Review your medicines with your doctor. Some medicines can make you feel dizzy. This can increase your chance of falling. Ask your doctor what other things that you can do to help prevent falls. This information is not intended to replace advice given to you by your health care provider. Make sure you discuss any questions you have with your health care provider. Document Released: 07/06/2009 Document Revised: 02/15/2016 Document Reviewed: 10/14/2014 Elsevier Interactive Patient Education  2017 [employer].

## 2020-08-23 DIAGNOSIS — I83224 Varicose veins of left lower extremity with both ulcer of heel and midfoot and inflammation: Secondary | ICD-10-CM | POA: Diagnosis not present

## 2020-08-23 DIAGNOSIS — I83214 Varicose veins of right lower extremity with both ulcer of heel and midfoot and inflammation: Secondary | ICD-10-CM | POA: Diagnosis not present

## 2020-08-30 DIAGNOSIS — I83224 Varicose veins of left lower extremity with both ulcer of heel and midfoot and inflammation: Secondary | ICD-10-CM | POA: Diagnosis not present

## 2020-08-30 DIAGNOSIS — I83214 Varicose veins of right lower extremity with both ulcer of heel and midfoot and inflammation: Secondary | ICD-10-CM | POA: Diagnosis not present

## 2020-09-05 ENCOUNTER — Other Ambulatory Visit: Payer: Self-pay | Admitting: Internal Medicine

## 2020-09-10 IMAGING — CT CT CHEST LCS NODULE FOLLOW-UP W/O CM
2 of 5 series · 15 of 40 positions shown, 18 images · non-contrast
Comparison: 05/19/2019.

CLINICAL DATA: 78-year-old male with 59 pack year history of
smoking. Lung cancer screening.

EXAM:
CT CHEST WITHOUT CONTRAST FOR LUNG CANCER SCREENING NODULE FOLLOW-UP
TECHNIQUE: Multidetector CT imaging of the chest was performed following the
standard protocol without IV contrast.

[Series 3: lung lcs f/u 1.00 · axial · 0.74mm/px · z∈[-1278,-936]mm · 12 of 382 slices shown, 15 images]
[im 20/382  mediastinal]
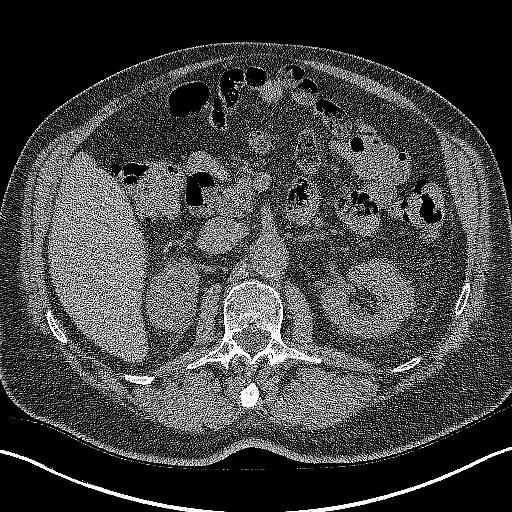
[im 20/382  lung]
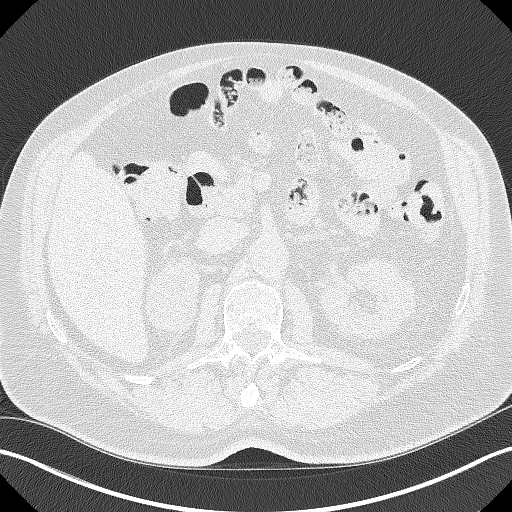
[im 58/382  lung]
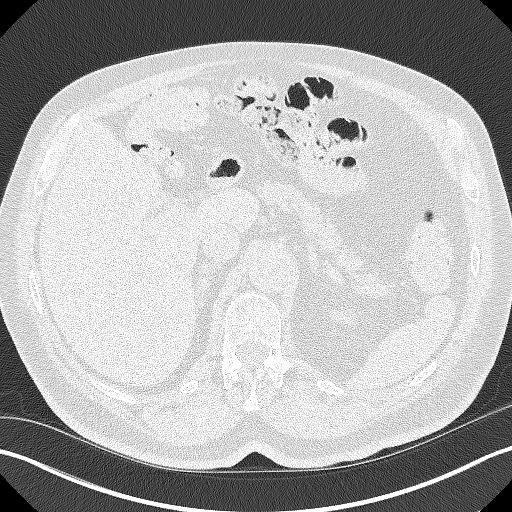
[im 77/382  lung]
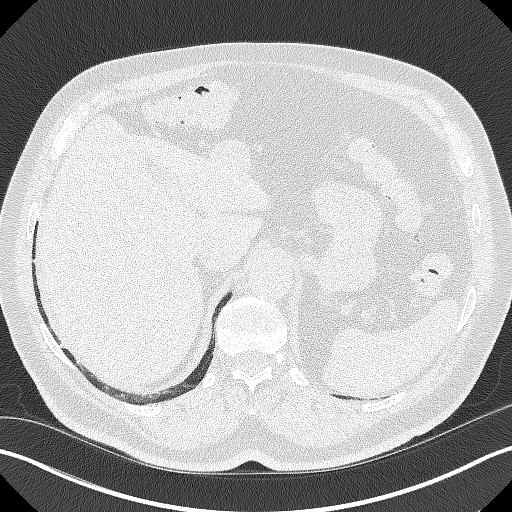
[im 115/382  lung]
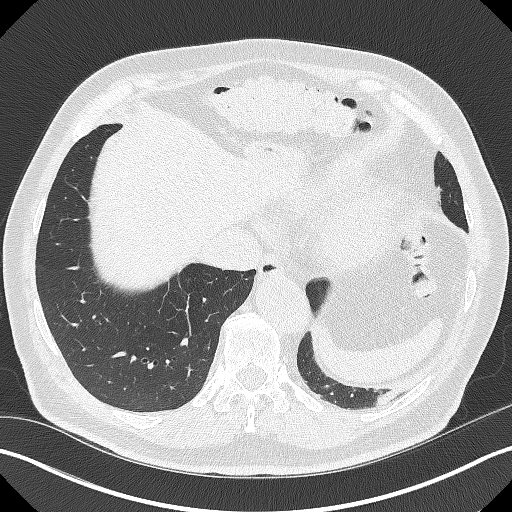
[im 153/382  mediastinal]
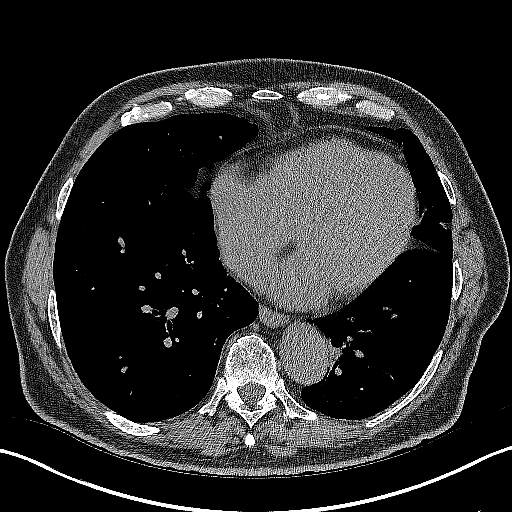
[im 153/382  lung]
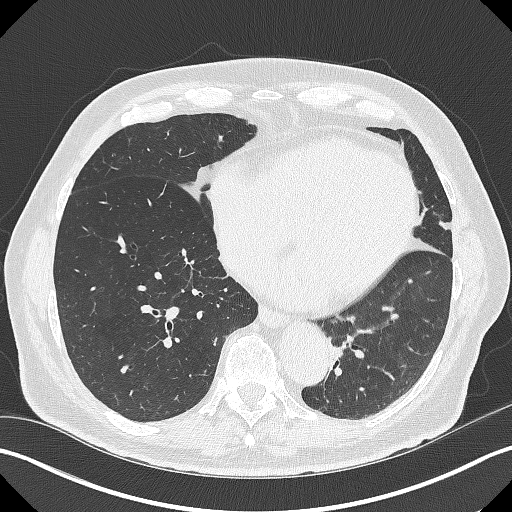
[im 172/382  lung]
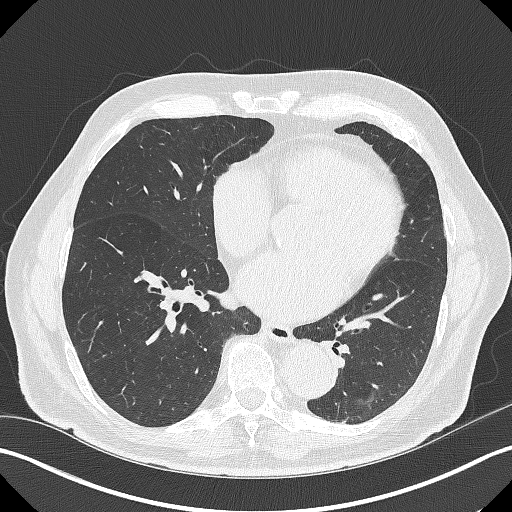
[im 210/382  lung]
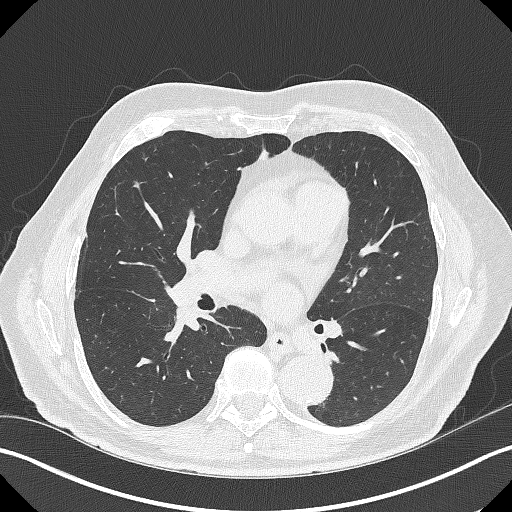
[im 229/382  lung]
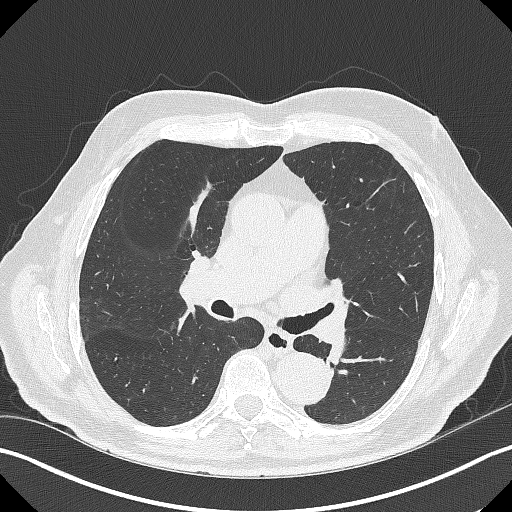
[im 267/382  mediastinal]
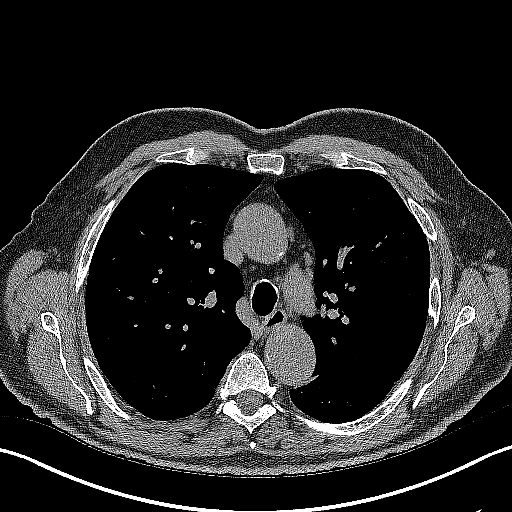
[im 267/382  lung]
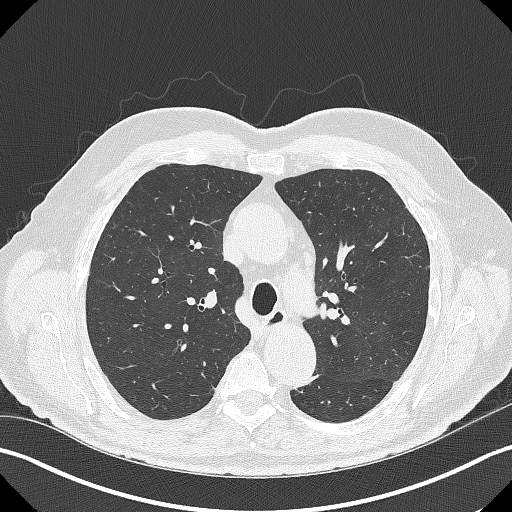
[im 305/382  lung]
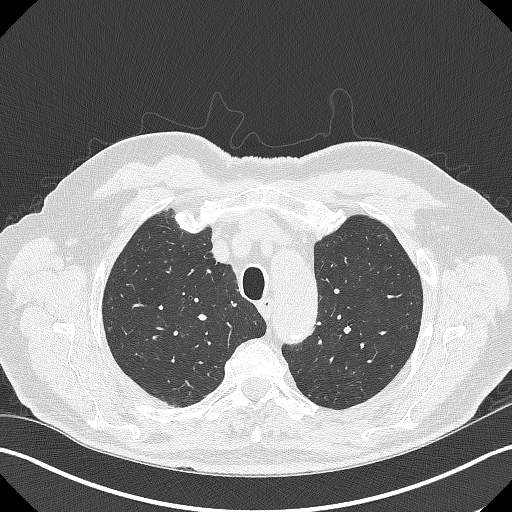
[im 324/382  lung]
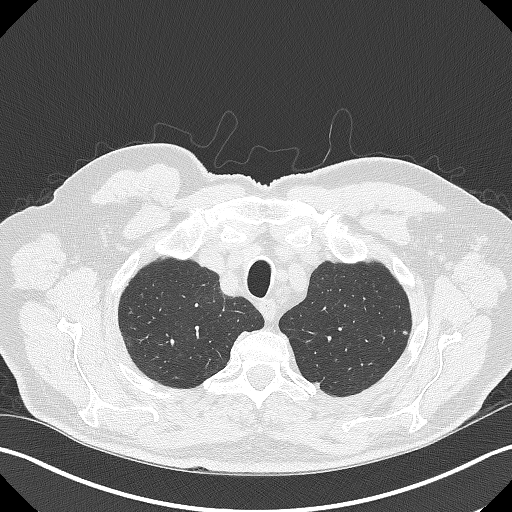
[im 362/382  lung]
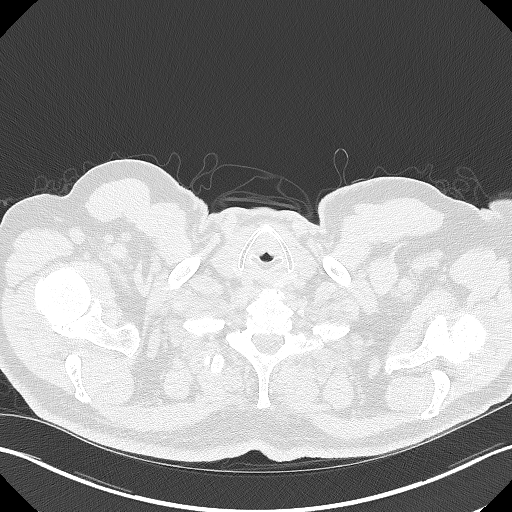

[Series 4: lcs f/u 1.00 cor · coronal · 0.74mm/px · 3 of 329 slices shown]
[im 66/329  lung]
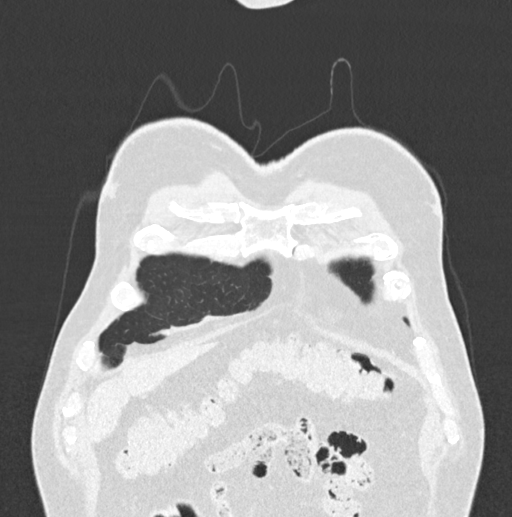
[im 132/329  lung]
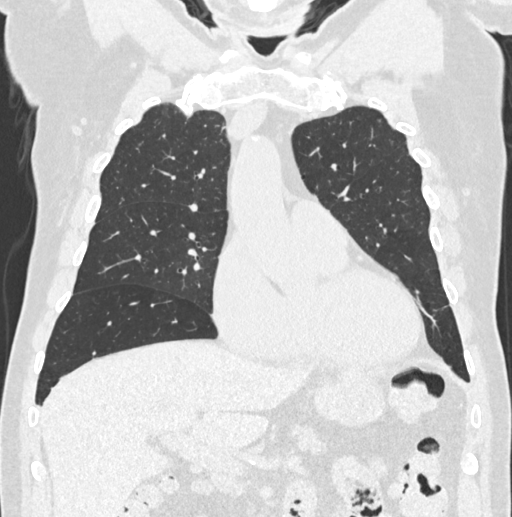
[im 197/329  lung]
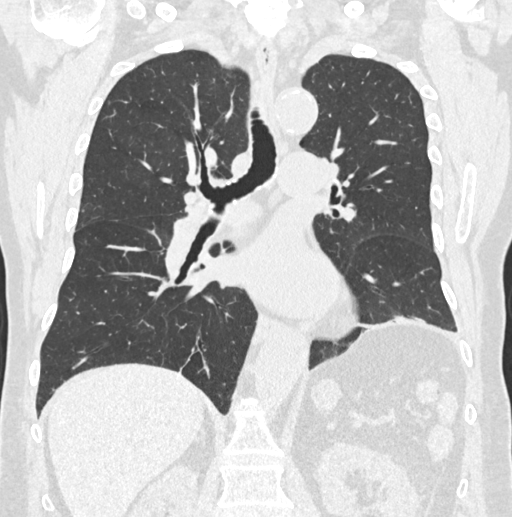

[15 of 40 positions shown; findings below may reference images not displayed]

FINDINGS: Cardiovascular: Cardiopericardial silhouette is at upper limits of
normal for size. Coronary artery calcification is evident.
Atherosclerotic calcification is noted in the wall of the thoracic
aorta. Ascending thoracic aorta measures 4 cm diameter.

Mediastinum/Nodes: No mediastinal lymphadenopathy. No evidence for
gross hilar lymphadenopathy although assessment is limited by the
lack of intravenous contrast on today's study. The esophagus has
normal imaging features. There is no axillary lymphadenopathy.

Lungs/Pleura: Centrilobular emphsyema noted. Scattered tiny
centrilobular ground-glass nodules with upper lung predominance
compatible with smoking related lung disease. Left upper lobe
pulmonary nodule shows continued further progression, now measuring
7.2 mm.

Upper Abdomen: Unremarkable.

Musculoskeletal: No worrisome lytic or sclerotic osseous
abnormality. Degenerative changes are noted in both shoulders.
IMPRESSION: 1. Continued enlargement of the left upper lobe nodule now 7.2 mm.
Lung-RADS 4A, suspicious. Follow up low-dose chest CT without
contrast in 3 months (please use the following order, "CT CHEST LCS
NODULE FOLLOW-UP W/O CM") is recommended. Alternatively, PET may be
considered when there is a solid component 8mm or larger.
2. Ascending thoracic aortic diameter of 4 cm. Recommend annual
imaging followup by CTA or MRA. This recommendation follows 6656
ACCF/AHA/AATS/ACR/ASA/SCA/SAN VASC/SANFORD/TANU/ASALDE Guidelines for the
Diagnosis and Management of Patients with Thoracic Aortic Disease.
Circulation. 6656; 121: [id number]. Aortic aneurysm NOS (0JD2A-A2W.2)
3.  Emphysema. (0JD2A-JJD.E)
4.  Aortic Atherosclerois (0JD2A-170.0)

## 2020-09-20 DIAGNOSIS — B351 Tinea unguium: Secondary | ICD-10-CM | POA: Diagnosis not present

## 2020-09-20 DIAGNOSIS — L609 Nail disorder, unspecified: Secondary | ICD-10-CM | POA: Diagnosis not present

## 2020-10-18 ENCOUNTER — Other Ambulatory Visit: Payer: Self-pay | Admitting: Internal Medicine

## 2020-11-21 ENCOUNTER — Telehealth: Payer: Self-pay | Admitting: *Deleted

## 2020-11-21 NOTE — Telephone Encounter (Signed)
Attempted to contact patient to schedule lung screening. Left message to call Shawn at [phone]. 

## 2020-11-24 ENCOUNTER — Telehealth: Payer: Self-pay | Admitting: *Deleted

## 2020-11-24 NOTE — Telephone Encounter (Signed)
Called patient to schedule annual CT screening, no answer, left VM to return call and schedule.

## 2020-11-30 ENCOUNTER — Telehealth: Payer: Self-pay

## 2020-11-30 NOTE — Telephone Encounter (Signed)
Attempted to contact patient to schedule lung screening. Left message to call Shawn at [phone]. 

## 2020-12-29 DIAGNOSIS — L03116 Cellulitis of left lower limb: Secondary | ICD-10-CM | POA: Diagnosis present

## 2020-12-29 DIAGNOSIS — I517 Cardiomegaly: Secondary | ICD-10-CM | POA: Diagnosis not present

## 2020-12-29 DIAGNOSIS — R6510 Systemic inflammatory response syndrome (SIRS) of non-infectious origin without acute organ dysfunction: Secondary | ICD-10-CM | POA: Diagnosis not present

## 2020-12-29 DIAGNOSIS — I890 Lymphedema, not elsewhere classified: Secondary | ICD-10-CM | POA: Diagnosis not present

## 2020-12-29 DIAGNOSIS — M25711 Osteophyte, right shoulder: Secondary | ICD-10-CM | POA: Diagnosis not present

## 2020-12-29 DIAGNOSIS — I4891 Unspecified atrial fibrillation: Secondary | ICD-10-CM | POA: Diagnosis not present

## 2020-12-29 DIAGNOSIS — R140 Abdominal distension (gaseous): Secondary | ICD-10-CM | POA: Diagnosis not present

## 2020-12-29 DIAGNOSIS — I447 Left bundle-branch block, unspecified: Secondary | ICD-10-CM | POA: Diagnosis present

## 2020-12-29 DIAGNOSIS — R0902 Hypoxemia: Secondary | ICD-10-CM | POA: Diagnosis not present

## 2020-12-29 DIAGNOSIS — M19011 Primary osteoarthritis, right shoulder: Secondary | ICD-10-CM | POA: Diagnosis not present

## 2020-12-29 DIAGNOSIS — R531 Weakness: Secondary | ICD-10-CM | POA: Diagnosis not present

## 2020-12-29 DIAGNOSIS — I878 Other specified disorders of veins: Secondary | ICD-10-CM | POA: Diagnosis present

## 2020-12-29 DIAGNOSIS — R609 Edema, unspecified: Secondary | ICD-10-CM | POA: Diagnosis not present

## 2020-12-29 DIAGNOSIS — Z86711 Personal history of pulmonary embolism: Secondary | ICD-10-CM | POA: Diagnosis not present

## 2020-12-29 DIAGNOSIS — L03115 Cellulitis of right lower limb: Secondary | ICD-10-CM | POA: Diagnosis present

## 2020-12-29 DIAGNOSIS — I340 Nonrheumatic mitral (valve) insufficiency: Secondary | ICD-10-CM | POA: Diagnosis not present

## 2020-12-29 DIAGNOSIS — A401 Sepsis due to streptococcus, group B: Secondary | ICD-10-CM | POA: Diagnosis present

## 2020-12-29 DIAGNOSIS — R5383 Other fatigue: Secondary | ICD-10-CM | POA: Diagnosis not present

## 2020-12-29 DIAGNOSIS — R000 Tachycardia, unspecified: Secondary | ICD-10-CM | POA: Diagnosis not present

## 2020-12-29 DIAGNOSIS — Z9181 History of falling: Secondary | ICD-10-CM | POA: Diagnosis not present

## 2020-12-29 DIAGNOSIS — Z20822 Contact with and (suspected) exposure to covid-19: Secondary | ICD-10-CM | POA: Diagnosis not present

## 2020-12-29 DIAGNOSIS — I499 Cardiac arrhythmia, unspecified: Secondary | ICD-10-CM | POA: Diagnosis not present

## 2020-12-29 DIAGNOSIS — M1990 Unspecified osteoarthritis, unspecified site: Secondary | ICD-10-CM | POA: Diagnosis present

## 2020-12-29 DIAGNOSIS — J984 Other disorders of lung: Secondary | ICD-10-CM | POA: Diagnosis not present

## 2020-12-29 DIAGNOSIS — F17210 Nicotine dependence, cigarettes, uncomplicated: Secondary | ICD-10-CM | POA: Diagnosis present

## 2020-12-29 DIAGNOSIS — R509 Fever, unspecified: Secondary | ICD-10-CM | POA: Diagnosis not present

## 2020-12-29 DIAGNOSIS — Z95828 Presence of other vascular implants and grafts: Secondary | ICD-10-CM | POA: Diagnosis not present

## 2020-12-29 DIAGNOSIS — R0781 Pleurodynia: Secondary | ICD-10-CM | POA: Diagnosis not present

## 2020-12-29 DIAGNOSIS — Z7901 Long term (current) use of anticoagulants: Secondary | ICD-10-CM | POA: Diagnosis not present

## 2020-12-29 DIAGNOSIS — J9811 Atelectasis: Secondary | ICD-10-CM | POA: Diagnosis not present

## 2020-12-29 DIAGNOSIS — Z23 Encounter for immunization: Secondary | ICD-10-CM | POA: Diagnosis not present

## 2020-12-29 DIAGNOSIS — I959 Hypotension, unspecified: Secondary | ICD-10-CM | POA: Diagnosis present

## 2020-12-29 DIAGNOSIS — L97919 Non-pressure chronic ulcer of unspecified part of right lower leg with unspecified severity: Secondary | ICD-10-CM | POA: Diagnosis present

## 2020-12-29 DIAGNOSIS — J441 Chronic obstructive pulmonary disease with (acute) exacerbation: Secondary | ICD-10-CM | POA: Diagnosis not present

## 2020-12-29 DIAGNOSIS — Z86718 Personal history of other venous thrombosis and embolism: Secondary | ICD-10-CM | POA: Diagnosis not present

## 2020-12-29 DIAGNOSIS — R918 Other nonspecific abnormal finding of lung field: Secondary | ICD-10-CM | POA: Diagnosis not present

## 2020-12-29 DIAGNOSIS — I361 Nonrheumatic tricuspid (valve) insufficiency: Secondary | ICD-10-CM | POA: Diagnosis not present

## 2021-01-03 DIAGNOSIS — I361 Nonrheumatic tricuspid (valve) insufficiency: Secondary | ICD-10-CM | POA: Diagnosis not present

## 2021-01-03 DIAGNOSIS — I4891 Unspecified atrial fibrillation: Secondary | ICD-10-CM | POA: Diagnosis not present

## 2021-01-03 DIAGNOSIS — I517 Cardiomegaly: Secondary | ICD-10-CM | POA: Diagnosis not present

## 2021-01-03 DIAGNOSIS — I340 Nonrheumatic mitral (valve) insufficiency: Secondary | ICD-10-CM | POA: Diagnosis not present

## 2021-01-04 ENCOUNTER — Telehealth: Payer: Self-pay

## 2021-01-04 NOTE — Telephone Encounter (Signed)
Transition Care Management Unsuccessful Follow-up Telephone Call  Date of discharge and from where:  01/03/21 from [place]  Attempts:  1st Attempt  Reason for unsuccessful TCM follow-up call:  No answer/busy. Will follow.

## 2021-01-08 NOTE — Telephone Encounter (Signed)
Transition Care Management Unsuccessful Follow-up Telephone Call  Date of discharge and from where:  01/03/21 from [place]  Attempts:  2nd Attempt  Reason for unsuccessful TCM follow-up call:  No answer/busy

## 2021-01-09 NOTE — Telephone Encounter (Signed)
Transition Care Management Follow-up Telephone Call No in office TCM appointment scheduled at this time.   Date of discharge and from where:  01/03/21 from [place] [facility]  Attempts:  3rd Attempt  Reason for unsuccessful TCM hospital follow up visit:   Patient notes he is scheduled to follow up with (Family Medicine) Heloise Ochoa D, NPon 01/10/21 which is closer to where he lives in [place], [place]. Reports he is feeling better. Nurse encourages patient to contact PCP as needed.

## 2021-01-10 DIAGNOSIS — F17210 Nicotine dependence, cigarettes, uncomplicated: Secondary | ICD-10-CM | POA: Diagnosis not present

## 2021-01-10 DIAGNOSIS — R931 Abnormal findings on diagnostic imaging of heart and coronary circulation: Secondary | ICD-10-CM | POA: Diagnosis not present

## 2021-01-10 DIAGNOSIS — Z8619 Personal history of other infectious and parasitic diseases: Secondary | ICD-10-CM | POA: Diagnosis not present

## 2021-01-10 DIAGNOSIS — I5032 Chronic diastolic (congestive) heart failure: Secondary | ICD-10-CM | POA: Diagnosis not present

## 2021-01-10 DIAGNOSIS — J432 Centrilobular emphysema: Secondary | ICD-10-CM | POA: Diagnosis not present

## 2021-04-09 DIAGNOSIS — I83214 Varicose veins of right lower extremity with both ulcer of heel and midfoot and inflammation: Secondary | ICD-10-CM | POA: Diagnosis not present

## 2021-04-09 DIAGNOSIS — I83224 Varicose veins of left lower extremity with both ulcer of heel and midfoot and inflammation: Secondary | ICD-10-CM | POA: Diagnosis not present

## 2021-04-12 DIAGNOSIS — I83224 Varicose veins of left lower extremity with both ulcer of heel and midfoot and inflammation: Secondary | ICD-10-CM | POA: Diagnosis not present

## 2021-04-12 DIAGNOSIS — I83214 Varicose veins of right lower extremity with both ulcer of heel and midfoot and inflammation: Secondary | ICD-10-CM | POA: Diagnosis not present

## 2021-04-19 DIAGNOSIS — I83214 Varicose veins of right lower extremity with both ulcer of heel and midfoot and inflammation: Secondary | ICD-10-CM | POA: Diagnosis not present

## 2021-04-19 DIAGNOSIS — I83224 Varicose veins of left lower extremity with both ulcer of heel and midfoot and inflammation: Secondary | ICD-10-CM | POA: Diagnosis not present

## 2021-04-23 DIAGNOSIS — I83224 Varicose veins of left lower extremity with both ulcer of heel and midfoot and inflammation: Secondary | ICD-10-CM | POA: Diagnosis not present

## 2021-04-23 DIAGNOSIS — I83214 Varicose veins of right lower extremity with both ulcer of heel and midfoot and inflammation: Secondary | ICD-10-CM | POA: Diagnosis not present

## 2021-04-25 ENCOUNTER — Other Ambulatory Visit: Payer: Self-pay | Admitting: Internal Medicine

## 2021-04-25 MED ORDER — APIXABAN 5 MG PO TABS: 5.0000 mg | ORAL_TABLET | Freq: Two times a day (BID) | ORAL | 0 refills | Status: DC

## 2021-04-25 NOTE — Telephone Encounter (Signed)
Okay to fill Eliquis no appt 12/20 has appt scheduled 08/23/21?

## 2021-04-26 DIAGNOSIS — L97909 Non-pressure chronic ulcer of unspecified part of unspecified lower leg with unspecified severity: Secondary | ICD-10-CM | POA: Diagnosis not present

## 2021-05-02 DIAGNOSIS — I83214 Varicose veins of right lower extremity with both ulcer of heel and midfoot and inflammation: Secondary | ICD-10-CM | POA: Diagnosis not present

## 2021-05-02 DIAGNOSIS — I83224 Varicose veins of left lower extremity with both ulcer of heel and midfoot and inflammation: Secondary | ICD-10-CM | POA: Diagnosis not present

## 2021-05-07 DIAGNOSIS — I83224 Varicose veins of left lower extremity with both ulcer of heel and midfoot and inflammation: Secondary | ICD-10-CM | POA: Diagnosis not present

## 2021-05-07 DIAGNOSIS — I83214 Varicose veins of right lower extremity with both ulcer of heel and midfoot and inflammation: Secondary | ICD-10-CM | POA: Diagnosis not present

## 2021-05-10 DIAGNOSIS — I83224 Varicose veins of left lower extremity with both ulcer of heel and midfoot and inflammation: Secondary | ICD-10-CM | POA: Diagnosis not present

## 2021-05-10 DIAGNOSIS — I83214 Varicose veins of right lower extremity with both ulcer of heel and midfoot and inflammation: Secondary | ICD-10-CM | POA: Diagnosis not present

## 2021-05-14 DIAGNOSIS — I83023 Varicose veins of left lower extremity with ulcer of ankle: Secondary | ICD-10-CM | POA: Diagnosis not present

## 2021-05-14 DIAGNOSIS — I83013 Varicose veins of right lower extremity with ulcer of ankle: Secondary | ICD-10-CM | POA: Diagnosis not present

## 2021-05-14 DIAGNOSIS — L97909 Non-pressure chronic ulcer of unspecified part of unspecified lower leg with unspecified severity: Secondary | ICD-10-CM | POA: Diagnosis not present

## 2021-05-17 DIAGNOSIS — I83023 Varicose veins of left lower extremity with ulcer of ankle: Secondary | ICD-10-CM | POA: Diagnosis not present

## 2021-05-17 DIAGNOSIS — I83013 Varicose veins of right lower extremity with ulcer of ankle: Secondary | ICD-10-CM | POA: Diagnosis not present

## 2021-05-17 DIAGNOSIS — L97909 Non-pressure chronic ulcer of unspecified part of unspecified lower leg with unspecified severity: Secondary | ICD-10-CM | POA: Diagnosis not present

## 2021-05-21 DIAGNOSIS — L97909 Non-pressure chronic ulcer of unspecified part of unspecified lower leg with unspecified severity: Secondary | ICD-10-CM | POA: Diagnosis not present

## 2021-05-21 DIAGNOSIS — I83013 Varicose veins of right lower extremity with ulcer of ankle: Secondary | ICD-10-CM | POA: Diagnosis not present

## 2021-05-21 DIAGNOSIS — I83023 Varicose veins of left lower extremity with ulcer of ankle: Secondary | ICD-10-CM | POA: Diagnosis not present

## 2021-05-24 DIAGNOSIS — L97909 Non-pressure chronic ulcer of unspecified part of unspecified lower leg with unspecified severity: Secondary | ICD-10-CM | POA: Diagnosis not present

## 2021-05-24 DIAGNOSIS — I83013 Varicose veins of right lower extremity with ulcer of ankle: Secondary | ICD-10-CM | POA: Diagnosis not present

## 2021-05-24 DIAGNOSIS — I83023 Varicose veins of left lower extremity with ulcer of ankle: Secondary | ICD-10-CM | POA: Diagnosis not present

## 2021-05-29 DIAGNOSIS — L97909 Non-pressure chronic ulcer of unspecified part of unspecified lower leg with unspecified severity: Secondary | ICD-10-CM | POA: Diagnosis not present

## 2021-05-29 DIAGNOSIS — I83013 Varicose veins of right lower extremity with ulcer of ankle: Secondary | ICD-10-CM | POA: Diagnosis not present

## 2021-05-29 DIAGNOSIS — I83023 Varicose veins of left lower extremity with ulcer of ankle: Secondary | ICD-10-CM | POA: Diagnosis not present

## 2021-05-31 DIAGNOSIS — I83013 Varicose veins of right lower extremity with ulcer of ankle: Secondary | ICD-10-CM | POA: Diagnosis not present

## 2021-05-31 DIAGNOSIS — I83023 Varicose veins of left lower extremity with ulcer of ankle: Secondary | ICD-10-CM | POA: Diagnosis not present

## 2021-05-31 DIAGNOSIS — L97909 Non-pressure chronic ulcer of unspecified part of unspecified lower leg with unspecified severity: Secondary | ICD-10-CM | POA: Diagnosis not present

## 2021-06-04 DIAGNOSIS — I83013 Varicose veins of right lower extremity with ulcer of ankle: Secondary | ICD-10-CM | POA: Diagnosis not present

## 2021-06-04 DIAGNOSIS — L97909 Non-pressure chronic ulcer of unspecified part of unspecified lower leg with unspecified severity: Secondary | ICD-10-CM | POA: Diagnosis not present

## 2021-06-04 DIAGNOSIS — I83023 Varicose veins of left lower extremity with ulcer of ankle: Secondary | ICD-10-CM | POA: Diagnosis not present

## 2021-06-07 DIAGNOSIS — L97909 Non-pressure chronic ulcer of unspecified part of unspecified lower leg with unspecified severity: Secondary | ICD-10-CM | POA: Diagnosis not present

## 2021-06-07 DIAGNOSIS — I83013 Varicose veins of right lower extremity with ulcer of ankle: Secondary | ICD-10-CM | POA: Diagnosis not present

## 2021-06-07 DIAGNOSIS — I83023 Varicose veins of left lower extremity with ulcer of ankle: Secondary | ICD-10-CM | POA: Diagnosis not present

## 2021-06-13 DIAGNOSIS — I83013 Varicose veins of right lower extremity with ulcer of ankle: Secondary | ICD-10-CM | POA: Diagnosis not present

## 2021-06-13 DIAGNOSIS — L97909 Non-pressure chronic ulcer of unspecified part of unspecified lower leg with unspecified severity: Secondary | ICD-10-CM | POA: Diagnosis not present

## 2021-06-13 DIAGNOSIS — I83023 Varicose veins of left lower extremity with ulcer of ankle: Secondary | ICD-10-CM | POA: Diagnosis not present

## 2021-06-15 DIAGNOSIS — I83013 Varicose veins of right lower extremity with ulcer of ankle: Secondary | ICD-10-CM | POA: Diagnosis not present

## 2021-06-15 DIAGNOSIS — L97909 Non-pressure chronic ulcer of unspecified part of unspecified lower leg with unspecified severity: Secondary | ICD-10-CM | POA: Diagnosis not present

## 2021-06-15 DIAGNOSIS — I83023 Varicose veins of left lower extremity with ulcer of ankle: Secondary | ICD-10-CM | POA: Diagnosis not present

## 2021-06-18 DIAGNOSIS — L97909 Non-pressure chronic ulcer of unspecified part of unspecified lower leg with unspecified severity: Secondary | ICD-10-CM | POA: Diagnosis not present

## 2021-06-18 DIAGNOSIS — I83023 Varicose veins of left lower extremity with ulcer of ankle: Secondary | ICD-10-CM | POA: Diagnosis not present

## 2021-06-18 DIAGNOSIS — I83013 Varicose veins of right lower extremity with ulcer of ankle: Secondary | ICD-10-CM | POA: Diagnosis not present

## 2021-06-21 DIAGNOSIS — I83013 Varicose veins of right lower extremity with ulcer of ankle: Secondary | ICD-10-CM | POA: Diagnosis not present

## 2021-06-21 DIAGNOSIS — I83023 Varicose veins of left lower extremity with ulcer of ankle: Secondary | ICD-10-CM | POA: Diagnosis not present

## 2021-06-21 DIAGNOSIS — L97909 Non-pressure chronic ulcer of unspecified part of unspecified lower leg with unspecified severity: Secondary | ICD-10-CM | POA: Diagnosis not present

## 2021-07-30 DIAGNOSIS — I83023 Varicose veins of left lower extremity with ulcer of ankle: Secondary | ICD-10-CM | POA: Diagnosis not present

## 2021-07-30 DIAGNOSIS — L97909 Non-pressure chronic ulcer of unspecified part of unspecified lower leg with unspecified severity: Secondary | ICD-10-CM | POA: Diagnosis not present

## 2021-07-30 DIAGNOSIS — I83013 Varicose veins of right lower extremity with ulcer of ankle: Secondary | ICD-10-CM | POA: Diagnosis not present

## 2021-08-01 DIAGNOSIS — L821 Other seborrheic keratosis: Secondary | ICD-10-CM | POA: Diagnosis not present

## 2021-08-17 ENCOUNTER — Other Ambulatory Visit: Payer: Self-pay | Admitting: Internal Medicine

## 2021-08-23 ENCOUNTER — Ambulatory Visit: Payer: Medicare Other

## 2021-08-27 DIAGNOSIS — I83013 Varicose veins of right lower extremity with ulcer of ankle: Secondary | ICD-10-CM | POA: Diagnosis not present

## 2021-08-27 DIAGNOSIS — L97909 Non-pressure chronic ulcer of unspecified part of unspecified lower leg with unspecified severity: Secondary | ICD-10-CM | POA: Diagnosis not present

## 2021-08-27 DIAGNOSIS — I83023 Varicose veins of left lower extremity with ulcer of ankle: Secondary | ICD-10-CM | POA: Diagnosis not present

## 2021-08-30 DIAGNOSIS — I83013 Varicose veins of right lower extremity with ulcer of ankle: Secondary | ICD-10-CM | POA: Diagnosis not present

## 2021-08-30 DIAGNOSIS — L97909 Non-pressure chronic ulcer of unspecified part of unspecified lower leg with unspecified severity: Secondary | ICD-10-CM | POA: Diagnosis not present

## 2021-08-30 DIAGNOSIS — I83023 Varicose veins of left lower extremity with ulcer of ankle: Secondary | ICD-10-CM | POA: Diagnosis not present

## 2021-09-18 ENCOUNTER — Telehealth: Payer: Self-pay | Admitting: Internal Medicine

## 2021-09-18 NOTE — Telephone Encounter (Signed)
Copied from [place] [id number]. Topic: Medicare AWV >> Sep 18, 2021 11:00 AM Harris-Coley, Hannah Beat wrote: Reason for CRM: LVM 09/18/21 at 11:00am that 10/09/20 appt date changed to 11/09/21 please confirm-khc

## 2021-09-27 DIAGNOSIS — L97909 Non-pressure chronic ulcer of unspecified part of unspecified lower leg with unspecified severity: Secondary | ICD-10-CM | POA: Diagnosis not present

## 2021-09-27 DIAGNOSIS — I83023 Varicose veins of left lower extremity with ulcer of ankle: Secondary | ICD-10-CM | POA: Diagnosis not present

## 2021-10-01 DIAGNOSIS — I83023 Varicose veins of left lower extremity with ulcer of ankle: Secondary | ICD-10-CM | POA: Diagnosis not present

## 2021-10-01 DIAGNOSIS — L97909 Non-pressure chronic ulcer of unspecified part of unspecified lower leg with unspecified severity: Secondary | ICD-10-CM | POA: Diagnosis not present

## 2021-10-03 ENCOUNTER — Ambulatory Visit (INDEPENDENT_AMBULATORY_CARE_PROVIDER_SITE_OTHER): Payer: Medicare Other | Admitting: Internal Medicine

## 2021-10-03 ENCOUNTER — Encounter: Payer: Self-pay | Admitting: Internal Medicine

## 2021-10-03 VITALS — Ht 76.0 in | Wt 248.0 lb

## 2021-10-03 DIAGNOSIS — J432 Centrilobular emphysema: Secondary | ICD-10-CM | POA: Diagnosis not present

## 2021-10-03 DIAGNOSIS — E786 Lipoprotein deficiency: Secondary | ICD-10-CM

## 2021-10-03 DIAGNOSIS — I878 Other specified disorders of veins: Secondary | ICD-10-CM

## 2021-10-03 DIAGNOSIS — E785 Hyperlipidemia, unspecified: Secondary | ICD-10-CM

## 2021-10-03 DIAGNOSIS — R5383 Other fatigue: Secondary | ICD-10-CM

## 2021-10-03 DIAGNOSIS — Z87891 Personal history of nicotine dependence: Secondary | ICD-10-CM | POA: Diagnosis not present

## 2021-10-03 DIAGNOSIS — I2699 Other pulmonary embolism without acute cor pulmonale: Secondary | ICD-10-CM | POA: Diagnosis not present

## 2021-10-03 DIAGNOSIS — E559 Vitamin D deficiency, unspecified: Secondary | ICD-10-CM | POA: Diagnosis not present

## 2021-10-03 NOTE — Progress Notes (Signed)
Telephone Note  This visit type was conducted due to national recommendations for restrictions regarding the COVID-19 pandemic (e.g. social distancing).  This format is felt to be most appropriate for this patient at this time.  All issues noted in this document were discussed and addressed.  No physical exam was performed (except for noted visual exam findings with Video Visits).   I connected withNAME@ on 10/03/21 at  3:30 PM EST by  telephone and verified that I am speaking with the correct person using two identifiers. Location patient: home Location provider: work or home office Persons participating in the virtual visit: patient, provider  I discussed the limitations, risks, security and privacy concerns of performing an evaluation and management service by telephone and the availability of in person appointments. I also discussed with the patient that there may be a patient responsible charge related to this service. The patient expressed understanding and agreed to proceed.  Reason for visit: follow up ,  last seen in person over 3 years.    HPI:  81 yr old male with advanced COPD,  atrial fibrillation   last seen in office prior to Jan 2019 ( all telephone visits) was Hospitalized  in [place] in April for COPD exacerbation and  respiratory failure requiring supplemental oxygen and ICU admission .  Was NOT discharged on home oxygen ,followed up with a  PA  in [place].   Not using any MDI;s except albuterol rarely   Has been checking pulse ox at home on RA  and sats have been  98%.  Not dyspneic with ADL's but does require assistance with grocery shopping   Not driving due to use of compression wraps similar to Unnas  boots for chronic leg ulcers.  Managed  by Dr Jacqualine Code., podiatry  Waiting to see a vascular surgeon .  Trying to elevate legs.    ROS: See pertinent positives and negatives per HPI.  Past Medical History:  Diagnosis Date   Arthritis    Chicken pox    COPD  (chronic obstructive pulmonary disease) (HCC)    Pulmonary emboli (HCC)    on Xarelto    Past Surgical History:  Procedure Laterality Date   CHOLECYSTECTOMY  2008   TONSILLECTOMY AND ADENOIDECTOMY  1947    Family History  Problem Relation Age of Onset   Arthritis Mother    Lung cancer Brother        was a smoker   Breast cancer Maternal Grandmother    Heart disease Maternal Grandfather    Asthma Father    Emphysema Father    Heart disease Paternal Grandfather     SOCIAL HX:  reports that he quit smoking about 8 years ago. His smoking use included cigarettes. He has a 59.00 pack-year smoking history. He has never used smokeless tobacco. He reports current alcohol use of about 2.0 standard drinks per week. He reports that he does not use drugs.   Current Outpatient Medications:    albuterol (VENTOLIN HFA) 108 (90 Base) MCG/ACT inhaler, Inhale 2 puffs into the lungs every 6 (six) hours as needed for wheezing. Reported on 02/28/2016, Disp: 3.7 g, Rfl: 5   apixaban (ELIQUIS) 5 MG TABS tablet, Take 1 tablet (5 mg total) by mouth 2 (two) times daily., Disp: 60 tablet, Rfl: 0   Cobalamin Combinations (B-12) 1000-400 MCG SUBL, Place 1,000 mcg under the tongue daily., Disp: 90 tablet, Rfl: 3   metoprolol succinate (TOPROL-XL) 25 MG 24 hr tablet, Take 25 mg by mouth  daily., Disp: , Rfl:    Potassium 99 MG TABS, Take 1 tablet by mouth daily., Disp: , Rfl:    simvastatin (ZOCOR) 20 MG tablet, TAKE 1 TABLET BY MOUTH EVERYDAY AT BEDTIME, Disp: 90 tablet, Rfl: 1   Triamcinolone Acetonide 0.025 % LOTN, APPLY TOPICALLY TO AFFECTED AREA 3 TIMES DAILY FOR 7 DAYS, Disp: , Rfl:    umeclidinium bromide (INCRUSE ELLIPTA) 62.5 MCG/INH AEPB, Inhale 1 puff into the lungs daily., Disp: 30 each, Rfl: 11   VITAMIN D, CHOLECALCIFEROL, PO, Take 5,000 Units by mouth daily., Disp: , Rfl:   EXAM:  VITALS per patient if applicable:  GENERAL: alert, oriented, appears well and in no acute distress.  Speech is  articulate but meandering and full of unimportant details  Lungs: speech is fluent sentence length suggests that patient is not short of breath and not punctuated by cough, sneezing or sniffing. Marland Kitchen   Psych: affect normal.  speech is articulate and non pressured .  Denies suicidal thoughts    ASSESSMENT AND PLAN:  Discussed the following assessment and plan:  Centrilobular emphysema ([place])  Hyperlipidemia with low HDL - Plan: Comprehensive metabolic panel, Lipid panel  Vitamin D deficiency - Plan: VITAMIN D 25 Hydroxy (Vit-D Deficiency, Fractures)  Personal history of tobacco use, presenting hazards to health  Other fatigue - Plan: CBC with Differential/Platelet, TSH  Lower extremity venous stasis  Recurrent pulmonary embolism (HCC)  COPD (chronic obstructive pulmonary disease) with emphysema ([place]) Secondary to tobacco abuse.  Not using  Incruse. Immunizations for influenza and pneumonia are up to date.  Encourage him to walk daily .  He has been referred to  [facility] but lives the majority of the time in [place].  .   Lower extremity venous stasis With ulcers, requiring the use of compression wraps.   Has not seen vascular yet.   Recurrent pulmonary embolism ([place]) . Continue Life long anticoagulation    Hyperlipidemia with low HDL Using the Framingham risk calculator,  his 10 year risk of coronary artery disease is 27%. He is tolerating  Statin therapy annd last LDL was  < 70.  LFTS normal by reviewe of outside labs done in April at [facility].   Lab Results  Component Value Date   CHOL 136 08/13/2019   HDL 39.40 08/13/2019   LDLCALC 74 08/13/2019   TRIG 115.0 08/13/2019   CHOLHDL 3 08/13/2019   Lab Results  Component Value Date   ALT 17 08/16/2019   AST 18 08/16/2019   ALKPHOS 63 08/16/2019   BILITOT 1.9 (H) 08/16/2019      I discussed the assessment and treatment plan with the patient. The patient was provided an opportunity to ask questions and all were  answered. The patient agreed with the plan and demonstrated an understanding of the instructions.   The patient was advised to call back or seek an in-person evaluation if the symptoms worsen or if the condition fails to improve as anticipated.   I spent 30 minutes dedicated to the care of this patient on the date of this encounter to include pre-visit review of his medical history,  non f ace-to-face time with the patient , and post visit ordering of testing and therapeutics.    [name], MD

## 2021-10-04 DIAGNOSIS — L97909 Non-pressure chronic ulcer of unspecified part of unspecified lower leg with unspecified severity: Secondary | ICD-10-CM | POA: Diagnosis not present

## 2021-10-04 DIAGNOSIS — I83023 Varicose veins of left lower extremity with ulcer of ankle: Secondary | ICD-10-CM | POA: Diagnosis not present

## 2021-10-04 NOTE — Assessment & Plan Note (Signed)
.   Continue Life long anticoagulation

## 2021-10-04 NOTE — Assessment & Plan Note (Signed)
Secondary to tobacco abuse.  Not using  Incruse. Immunizations for influenza and pneumonia are up to date.  Encourage him to walk daily .  He has been referred to  [facility] but lives the majority of the time in [place].  Marland Kitchen

## 2021-10-04 NOTE — Assessment & Plan Note (Addendum)
With ulcers, requiring the use of compression wraps.   Has not seen vascular yet.

## 2021-10-04 NOTE — Assessment & Plan Note (Signed)
Using the Framingham risk calculator,  his 10 year risk of coronary artery disease is 27%. He is tolerating  Statin therapy annd last LDL was  < 70.  LFTS normal by reviewe of outside labs done in April at [facility].   Lab Results  Component Value Date   CHOL 136 08/13/2019   HDL 39.40 08/13/2019   LDLCALC 74 08/13/2019   TRIG 115.0 08/13/2019   CHOLHDL 3 08/13/2019   Lab Results  Component Value Date   ALT 17 08/16/2019   AST 18 08/16/2019   ALKPHOS 63 08/16/2019   BILITOT 1.9 (H) 08/16/2019

## 2021-10-08 DIAGNOSIS — L97909 Non-pressure chronic ulcer of unspecified part of unspecified lower leg with unspecified severity: Secondary | ICD-10-CM | POA: Diagnosis not present

## 2021-10-08 DIAGNOSIS — I83023 Varicose veins of left lower extremity with ulcer of ankle: Secondary | ICD-10-CM | POA: Diagnosis not present

## 2021-10-09 ENCOUNTER — Ambulatory Visit: Payer: Medicare Other

## 2021-10-11 DIAGNOSIS — L97909 Non-pressure chronic ulcer of unspecified part of unspecified lower leg with unspecified severity: Secondary | ICD-10-CM | POA: Diagnosis not present

## 2021-10-11 DIAGNOSIS — I83023 Varicose veins of left lower extremity with ulcer of ankle: Secondary | ICD-10-CM | POA: Diagnosis not present

## 2021-10-17 ENCOUNTER — Other Ambulatory Visit (INDEPENDENT_AMBULATORY_CARE_PROVIDER_SITE_OTHER): Payer: Medicare Other

## 2021-10-17 ENCOUNTER — Other Ambulatory Visit: Payer: Self-pay

## 2021-10-17 DIAGNOSIS — R5383 Other fatigue: Secondary | ICD-10-CM | POA: Diagnosis not present

## 2021-10-17 DIAGNOSIS — E559 Vitamin D deficiency, unspecified: Secondary | ICD-10-CM | POA: Diagnosis not present

## 2021-10-17 DIAGNOSIS — E785 Hyperlipidemia, unspecified: Secondary | ICD-10-CM | POA: Diagnosis not present

## 2021-10-17 DIAGNOSIS — E786 Lipoprotein deficiency: Secondary | ICD-10-CM

## 2021-10-17 LAB — COMPREHENSIVE METABOLIC PANEL
ALT: 10 U/L (ref 0–53)
AST: 15 U/L (ref 0–37)
Albumin: 3.8 g/dL (ref 3.5–5.2)
Alkaline Phosphatase: 82 U/L (ref 39–117)
BUN: 13 mg/dL (ref 6–23)
CO2: 33 mEq/L — ABNORMAL HIGH (ref 19–32)
Calcium: 9 mg/dL (ref 8.4–10.5)
Chloride: 103 mEq/L (ref 96–112)
Creatinine, Ser: 0.88 mg/dL (ref 0.40–1.50)
GFR: 80.61 mL/min (ref >60.00)
Glucose, Bld: 100 mg/dL — ABNORMAL HIGH (ref 70–99)
Potassium: 4.7 mEq/L (ref 3.5–5.1)
Sodium: 141 mEq/L (ref 135–145)
Total Bilirubin: 0.8 mg/dL (ref 0.2–1.2)
Total Protein: 6.6 g/dL (ref 6.0–8.3)

## 2021-10-17 LAB — LIPID PANEL
Cholesterol: 116 mg/dL (ref 0–200)
HDL: 35.4 mg/dL — ABNORMAL LOW (ref >39.00)
LDL Cholesterol: 63 mg/dL (ref 0–99)
NonHDL: 80.35
Total CHOL/HDL Ratio: 3
Triglycerides: 89 mg/dL (ref 0.0–149.0)
VLDL: 17.8 mg/dL (ref 0.0–40.0)

## 2021-10-17 LAB — CBC WITH DIFFERENTIAL/PLATELET
Basophils Absolute: 0 10*3/uL (ref 0.0–0.1)
Basophils Relative: 0.3 % (ref 0.0–3.0)
Eosinophils Absolute: 0.1 10*3/uL (ref 0.0–0.7)
Eosinophils Relative: 1.3 % (ref 0.0–5.0)
HCT: 40 % (ref 39.0–52.0)
Hemoglobin: 12.9 g/dL — ABNORMAL LOW (ref 13.0–17.0)
Lymphocytes Relative: 31.2 % (ref 12.0–46.0)
Lymphs Abs: 2.3 10*3/uL (ref 0.7–4.0)
MCHC: 32.3 g/dL (ref 30.0–36.0)
MCV: 93.9 fl (ref 78.0–100.0)
Monocytes Absolute: 0.9 10*3/uL (ref 0.1–1.0)
Monocytes Relative: 12.9 % — ABNORMAL HIGH (ref 3.0–12.0)
Neutro Abs: 4 10*3/uL (ref 1.4–7.7)
Neutrophils Relative %: 54.3 % (ref 43.0–77.0)
Platelets: 201 10*3/uL (ref 150.0–400.0)
RBC: 4.26 Mil/uL (ref 4.22–5.81)
RDW: 13.7 % (ref 11.5–15.5)
WBC: 7.3 10*3/uL (ref 4.0–10.5)

## 2021-10-17 LAB — TSH: TSH: 2.95 u[IU]/mL (ref 0.35–5.50)

## 2021-10-17 LAB — VITAMIN D 25 HYDROXY (VIT D DEFICIENCY, FRACTURES): VITD: 52.53 ng/mL (ref 30.00–100.00)

## 2021-10-22 DIAGNOSIS — I83012 Varicose veins of right lower extremity with ulcer of calf: Secondary | ICD-10-CM | POA: Diagnosis not present

## 2021-10-22 DIAGNOSIS — I83013 Varicose veins of right lower extremity with ulcer of ankle: Secondary | ICD-10-CM | POA: Diagnosis not present

## 2021-10-22 DIAGNOSIS — I83023 Varicose veins of left lower extremity with ulcer of ankle: Secondary | ICD-10-CM | POA: Diagnosis not present

## 2021-10-22 DIAGNOSIS — I83022 Varicose veins of left lower extremity with ulcer of calf: Secondary | ICD-10-CM | POA: Diagnosis not present

## 2021-10-24 ENCOUNTER — Other Ambulatory Visit: Payer: Self-pay

## 2021-10-24 ENCOUNTER — Emergency Department
Admission: EM | Admit: 2021-10-24 | Discharge: 2021-10-24 | Disposition: A | Discharge status: Home / Self Care | Payer: Medicare Other | Attending: Emergency Medicine | Admitting: Emergency Medicine

## 2021-10-24 ENCOUNTER — Encounter: Payer: Self-pay | Admitting: Internal Medicine

## 2021-10-24 ENCOUNTER — Emergency Department: Payer: Medicare Other

## 2021-10-24 ENCOUNTER — Ambulatory Visit (INDEPENDENT_AMBULATORY_CARE_PROVIDER_SITE_OTHER): Payer: Medicare Other | Admitting: Internal Medicine

## 2021-10-24 VITALS — BP 142/90 | HR 75 | Temp 97.1°F | Resp 15 | Wt 259.1 lb

## 2021-10-24 DIAGNOSIS — I50810 Right heart failure, unspecified: Secondary | ICD-10-CM | POA: Diagnosis not present

## 2021-10-24 DIAGNOSIS — I87009 Postthrombotic syndrome without complications of unspecified extremity: Secondary | ICD-10-CM

## 2021-10-24 DIAGNOSIS — J449 Chronic obstructive pulmonary disease, unspecified: Secondary | ICD-10-CM | POA: Insufficient documentation

## 2021-10-24 DIAGNOSIS — I499 Cardiac arrhythmia, unspecified: Secondary | ICD-10-CM | POA: Diagnosis not present

## 2021-10-24 DIAGNOSIS — Z7901 Long term (current) use of anticoagulants: Secondary | ICD-10-CM | POA: Diagnosis not present

## 2021-10-24 DIAGNOSIS — J432 Centrilobular emphysema: Secondary | ICD-10-CM | POA: Diagnosis not present

## 2021-10-24 DIAGNOSIS — I4891 Unspecified atrial fibrillation: Secondary | ICD-10-CM

## 2021-10-24 DIAGNOSIS — R918 Other nonspecific abnormal finding of lung field: Secondary | ICD-10-CM | POA: Diagnosis not present

## 2021-10-24 DIAGNOSIS — Z86711 Personal history of pulmonary embolism: Secondary | ICD-10-CM

## 2021-10-24 DIAGNOSIS — I2699 Other pulmonary embolism without acute cor pulmonale: Secondary | ICD-10-CM | POA: Diagnosis not present

## 2021-10-24 DIAGNOSIS — J9600 Acute respiratory failure, unspecified whether with hypoxia or hypercapnia: Secondary | ICD-10-CM | POA: Insufficient documentation

## 2021-10-24 LAB — CBC
HCT: 41.8 % (ref 39.0–52.0)
Hemoglobin: 13.7 g/dL (ref 13.0–17.0)
MCH: 30.9 pg (ref 26.0–34.0)
MCHC: 32.8 g/dL (ref 30.0–36.0)
MCV: 94.1 fL (ref 80.0–100.0)
Platelets: 181 10*3/uL (ref 150–400)
RBC: 4.44 MIL/uL (ref 4.22–5.81)
RDW: 13.1 % (ref 11.5–15.5)
WBC: 8.1 10*3/uL (ref 4.0–10.5)
nRBC: 0 % (ref 0.0–0.2)

## 2021-10-24 LAB — BRAIN NATRIURETIC PEPTIDE: B Natriuretic Peptide: 98.5 pg/mL (ref 0.0–100.0)

## 2021-10-24 LAB — BASIC METABOLIC PANEL
Anion gap: 6 (ref 5–15)
BUN: 17 mg/dL (ref 8–23)
CO2: 27 mmol/L (ref 22–32)
Calcium: 9 mg/dL (ref 8.9–10.3)
Chloride: 106 mmol/L (ref 98–111)
Creatinine, Ser: 0.86 mg/dL (ref 0.61–1.24)
GFR, Estimated: >60 mL/min (ref >60)
Glucose, Bld: 114 mg/dL — ABNORMAL HIGH (ref 70–99)
Potassium: 3.8 mmol/L (ref 3.5–5.1)
Sodium: 139 mmol/L (ref 135–145)

## 2021-10-24 LAB — TROPONIN I (HIGH SENSITIVITY)
Troponin I (High Sensitivity): 8 ng/L (ref <18)
Troponin I (High Sensitivity): 8 ng/L (ref <18)

## 2021-10-24 MED ORDER — TRAMADOL HCL 50 MG PO TABS: 50.0000 mg | ORAL_TABLET | Freq: Four times a day (QID) | ORAL | 0 refills | Status: DC | PRN

## 2021-10-24 MED ORDER — FLUTICASONE-SALMETEROL 113-14 MCG/ACT IN AEPB
1.0000 | INHALATION_SPRAY | Freq: Two times a day (BID) | RESPIRATORY_TRACT | 1 refills | Status: DC
Start: 2021-10-24 — End: 2021-11-14

## 2021-10-24 MED ORDER — APIXABAN 5 MG PO TABS
5.0000 mg | ORAL_TABLET | Freq: Once | ORAL | Status: AC
  Administered 2021-10-24: 5 mg via ORAL
  Filled 2021-10-24: qty 1

## 2021-10-24 MED ORDER — METOPROLOL TARTRATE 5 MG/5ML IV SOLN
2.5000 mg | Freq: Once | INTRAVENOUS | Status: AC
  Administered 2021-10-24: 2.5 mg via INTRAVENOUS
  Filled 2021-10-24: qty 5

## 2021-10-24 MED ORDER — METOPROLOL SUCCINATE ER 50 MG PO TB24
25.0000 mg | ORAL_TABLET | Freq: Once | ORAL | Status: AC
  Administered 2021-10-24: 25 mg via ORAL
  Filled 2021-10-24: qty 1

## 2021-10-24 MED ORDER — METOPROLOL SUCCINATE ER 25 MG PO TB24: 25.0000 mg | ORAL_TABLET | Freq: Two times a day (BID) | ORAL | 0 refills | Status: DC

## 2021-10-24 NOTE — Progress Notes (Addendum)
Subjective:  Patient ID: Seth Perez., male    DOB: May 18, 1941  Age: 81 y.o. MRN: [id number]  CC: The primary encounter diagnosis was Irregular heart rhythm. Diagnoses of Multiple pulmonary nodules determined by computed tomography of lung, History of pulmonary embolism, Acute respiratory failure, unspecified whether with hypoxia or hypercapnia ([place]), Atrial fibrillation, unspecified type [facility]), Pulmonary embolism and infarction ([place]), and Post-phlebitic syndrome were also pertinent to this visit.   This visit occurred during the SARS-CoV-2 public health emergency.  Safety protocols were in place, including screening questions prior to the visit, additional usage of staff PPE, and extensive cleaning of exam room while observing appropriate contact time as indicated for disinfecting solutions.    HPI [name] presents for follow up on COPD, hyperlipidemia, history of  unprovoked DVT/PE with post phlebitic syndrome , last seen in office > 2 years ago.  No chief complaint on file.  Patient denies any precedent symptoms of COPD exacerbation and of dyspnea at rest but noted to be quite tachypneic by staff.  He reports dyspnea with exertion,  chronic orthopnea (sleeps in recliner).  He attributes his current symptoms at rest to "rushing around today"  He has had no cardiology or pulmonary follow up in > 1 year.  He denies any recnet weight gain but does not weigh regularly  He has post phlebitic syndrome with chronic venous insufficiency and recurrent leg ulcers managed by Unnas boot and compression wraps that are changed every Monday and Thrusday  by Dr Jacqualine Code in [place]    Outpatient Medications Prior to Visit  Medication Sig Dispense Refill   albuterol (VENTOLIN HFA) 108 (90 Base) MCG/ACT inhaler Inhale 2 puffs into the lungs every 6 (six) hours as needed for wheezing. Reported on 02/28/2016 3.7 g 5   apixaban (ELIQUIS) 5 MG TABS tablet Take 1 tablet (5 mg total) by  mouth 2 (two) times daily. 60 tablet 0   Cobalamin Combinations (B-12) 1000-400 MCG SUBL Place 1,000 mcg under the tongue daily. 90 tablet 3   metoprolol succinate (TOPROL-XL) 25 MG 24 hr tablet Take 25 mg by mouth daily.     Potassium 99 MG TABS Take 1 tablet by mouth daily.     simvastatin (ZOCOR) 20 MG tablet TAKE 1 TABLET BY MOUTH EVERYDAY AT BEDTIME 90 tablet 1   Triamcinolone Acetonide 0.025 % LOTN APPLY TOPICALLY TO AFFECTED AREA 3 TIMES DAILY FOR 7 DAYS     umeclidinium bromide (INCRUSE ELLIPTA) 62.5 MCG/INH AEPB Inhale 1 puff into the lungs daily. 30 each 11   VITAMIN D, CHOLECALCIFEROL, PO Take 5,000 Units by mouth daily.     No facility-administered medications prior to visit.    Review of Systems;  Patient denies headache, fevers, malaise, unintentional weight loss, skin rash, eye pain, sinus congestion and sinus pain, sore throat, dysphagia,  hemoptysis , cough, dyspnea, wheezing, chest pain, palpitations, orthopnea, edema, abdominal pain, nausea, melena, diarrhea, constipation, flank pain, dysuria, hematuria, urinary  Frequency, nocturia, numbness, tingling, seizures,  Focal weakness, Loss of consciousness,  Tremor, insomnia, depression, anxiety, and suicidal ideation.      Objective:  BP (!) 142/90    Pulse 75    Temp (!) 97.1 F (36.2 C) (Oral)    Resp 15    Wt 259 lb 2 oz (117.5 kg)    SpO2 97%    BMI 31.54 kg/m   BP Readings from Last 3 Encounters:  10/24/21 (!) 142/90  10/20/19 130/72  08/31/19 130/85  Wt Readings from Last 3 Encounters:  10/24/21 259 lb 2 oz (117.5 kg)  10/03/21 248 lb (112.5 kg)  08/22/20 248 lb (112.5 kg)    General appearance: alert, cooperative and appears stated age Ears: normal TM's and external ear canals both ears Throat: lips, mucosa, and tongue normal; teeth and gums normal Neck: no adenopathy, no carotid bruit, supple, symmetrical, trachea midline and thyroid not enlarged, symmetric, no tenderness/mass/nodules Back: symmetric,  kyphotic . ROM normal. No CVA tenderness. Lungs: poor air movement bilaterally wih wheezing in all lung fields  Heart: Heart sounds distant.  irregular rate and rhythm, S1, S2 normal, no murmur, click, rub or gallop Abdomen: soft, non-tender; bowel sounds normal; no masses,  no organomegaly Pulses: not palpable secondary to compression wraps. ic Skin: legs are quite edematous,  wrapped in Unnas boots.  Skin color, texture, turgor normal. No rashes or lesions Lymph nodes: Cervical, supraclavicular, and axillary nodes normal.  Lab Results  Component Value Date   HGBA1C 5.5 09/22/2017   HGBA1C 5.7 10/24/2012   HGBA1C 5.8 10/23/2012    Lab Results  Component Value Date   CREATININE 0.88 10/17/2021   CREATININE 1.03 08/17/2019   CREATININE 0.99 08/16/2019    Lab Results  Component Value Date   WBC 7.3 10/17/2021   HGB 12.9 (L) 10/17/2021   HCT 40.0 10/17/2021   PLT 201.0 10/17/2021   GLUCOSE 100 (H) 10/17/2021   CHOL 116 10/17/2021   TRIG 89.0 10/17/2021   HDL 35.40 (L) 10/17/2021   LDLCALC 63 10/17/2021   ALT 10 10/17/2021   AST 15 10/17/2021   NA 141 10/17/2021   K 4.7 10/17/2021   CL 103 10/17/2021   CREATININE 0.88 10/17/2021   BUN 13 10/17/2021   CO2 33 (H) 10/17/2021   TSH 2.95 10/17/2021   PSA 11.44 (H) 11/24/2012   INR 1.5 01/26/2014   HGBA1C 5.5 09/22/2017    CT CHEST LCS NODULE F/U W/O CONTRAST  Result Date: 11/26/2019 CLINICAL DATA:  81 year old male former smoker (quit in 2014) with 59 pack-year history of smoking. Follow-up for prior abnormal low-dose lung cancer screening examination. EXAM: CT CHEST WITHOUT CONTRAST FOR LUNG CANCER SCREENING NODULE FOLLOW-UP TECHNIQUE: Multidetector CT imaging of the chest was performed following the standard protocol without IV contrast. COMPARISON:  Low-dose lung cancer screening chest CT 08/23/2019. FINDINGS: Cardiovascular: Heart size is normal. There is no significant pericardial fluid, thickening or pericardial  calcification. There is aortic atherosclerosis, as well as atherosclerosis of the great vessels of the mediastinum and the coronary arteries, including calcified atherosclerotic plaque in the left main, left anterior descending, left circumflex and right coronary arteries. Mediastinum/Nodes: No pathologically enlarged mediastinal or hilar lymph nodes. Please note that accurate exclusion of hilar adenopathy is limited on noncontrast CT scans. Esophagus is unremarkable in appearance. No axillary lymphadenopathy. Lungs/Pleura: The nodule of concern on the prior study in the left upper lobe is stable, with a volume derived mean diameter of only 6.7 mm on today's examination (axial image 80 of series 3). No other new suspicious appearing pulmonary nodules or masses are noted. No acute consolidative airspace disease. No pleural effusions. Mild diffuse bronchial wall thickening with very mild centrilobular and paraseptal emphysema. Upper Abdomen: Status post cholecystectomy.  Aortic atherosclerosis. Musculoskeletal: There are no aggressive appearing lytic or blastic lesions noted in the visualized portions of the skeleton. IMPRESSION: 1. Lung-RADS 2S, benign appearance or behavior. Continue annual screening with low-dose chest CT without contrast in 12 months. 2. The "S" modifier  above refers to potentially clinically significant non lung cancer related findings. Specifically, there is aortic atherosclerosis, in addition to left main and 3 vessel coronary artery disease. Please note that although the presence of coronary artery calcium documents the presence of coronary artery disease, the severity of this disease and any potential stenosis cannot be assessed on this non-gated CT examination. Assessment for potential risk factor modification, dietary therapy or pharmacologic therapy may be warranted, if clinically indicated. 3. Mild diffuse bronchial wall thickening with very mild centrilobular and paraseptal emphysema;  imaging findings suggestive of underlying COPD. Aortic Atherosclerosis (ICD10-I70.0) and Emphysema (ICD10-J43.9). Electronically Signed   By: Vinnie Langton M.D.   On: 11/26/2019 15:10    Assessment & Plan:   Problem List Items Addressed This Visit     History of pulmonary embolism    He continues to use Eliquis  For lifelong anticoagulation.  CBC is normal      Multiple pulmonary nodules determined by computed tomography of lung    He continues to have annual screening  Last one March 2021       Relevant Orders   Ambulatory referral to Pulmonology   Post-phlebitic syndrome    With chronic venous insufficiency and recurrent leg ulcers.  Currently under the care of Dr Jacqualine Code in [place] with compression wraps      Pulmonary embolism and infarction ([place])    . Continue Life long anticoagulation        Acute respiratory failure ([place])    He is not hypoxic but he is tachypneic , wheezing and in new onset atrial fibrillation with significant conduction delays not seen on 2019 EKg.  Sending to ER       Atrial fibrillation ([place])    Chronicity unclear as he has not been seen in several years.  I have ordered and reviewed a 12 lead EKG and find that there are multiple changes  Including RBBB and LAFB.  Previous EKG from 2019 notes sinus rhythm,  No conduction delays.  He has no local or recent cardiologist; sending to ER for evaluation and treatment       Other Visit Diagnoses     Irregular heart rhythm    -  Primary   Relevant Orders   EKG 12-Lead (Completed)       I spent 30 minutes dedicated to the care of this patient on the date of this encounter to include pre-visit review of patient's medical history,  most recent imaging studies, Face-to-face time with the patient , and post visit ordering of testing and therapeutics.    Follow-up: No follow-ups on file.   [name], MD

## 2021-10-24 NOTE — ED Triage Notes (Signed)
Pt was sent by his dr with new onset ekg changes, pt's ekg from the office is showing atrial fibrillation and has never had that before, pt is getting bilat lower legs wrapped for swelling. Pt states some sob

## 2021-10-24 NOTE — Discharge Instructions (Addendum)
DO NOT TAKE YOUR ELIQUIS OR METOPROLOL TONIGHT. YOU WERE GIVEN THESE IN THE ER.  For your AFib:  - INCREASE your metoprolol 25 mg XL from one tablet daily at night to one tablet TWICE a day. This has been sent in as a new prescription. - CONTINUE your Eliquis - MONITOR your BP at home, check it twice a day and call your Doctor to discuss your med changes. HOLD your metoprolol for BP that are consistently low, (<110 on top number)  FOR YOUR BREATHING  - I've called in a cheaper alternative for your Breo - Call and discuss with your primary - START THIS TOMORROW

## 2021-10-24 NOTE — Assessment & Plan Note (Signed)
He is not hypoxic but he is tachypneic , wheezing and in new onset atrial fibrillation with significant conduction delays not seen on 2019 EKg.  Sending to ER

## 2021-10-24 NOTE — Assessment & Plan Note (Signed)
.   Continue Life long anticoagulation

## 2021-10-24 NOTE — Assessment & Plan Note (Signed)
He continues to have annual screening  Last one March 2021

## 2021-10-24 NOTE — ED Provider Notes (Signed)
[facility] Provider Note    Event Date/Time   First MD Initiated Contact with Patient 10/24/21 1752     (approximate)   History   Shortness of Breath and Atrial Fibrillation   HPI  Seth WHIRLEY Sr. is a 81 y.o. male here with atrial fibrillation.  Patient sent in by his PCP after being found to be in new onset atrial fibrillation.  The patient states that he has not had any kind of palpitations, significant shortness of breath, chest pain, or other complaints.  He states he wheezes, but states this is from his COPD and admits he has not been using his inhalers.  He states that when he got to the office today he was worked up because he had to walk into the office, and that this was all fairly quickly resolved.  Denies any current complaints.  States he feels to his baseline.  Denies recent medication changes.  No fevers or chills.  No swelling in his legs.  Denies known history of A-fib, though this has been mentioned in his chart.  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  Denies any focal numbness or weakness or other neurological symptoms.  No other complaints.     Physical Exam   Triage Vital Signs: ED Triage Vitals  Enc Vitals Group     BP 10/24/21 1622 132/80     Pulse Rate 10/24/21 1622 97     Resp 10/24/21 1622 16     Temp 10/24/21 1622 97.9 F (36.6 C)     Temp Source 10/24/21 1622 Oral     SpO2 10/24/21 1622 97 %     Weight 10/24/21 1618 259 lb 0.7 oz (117.5 kg)     Height 10/24/21 1618 6\' 4"  (1.93 m)     Head Circumference --      Peak Flow --      Pain Score 10/24/21 1622 0     Pain Loc --      Pain Edu? --      Excl. in [place]? --     Most recent vital signs: Vitals:   10/24/21 2030 10/24/21 2109  BP: (!) 129/95 114/83  Pulse: 93 86  Resp: (!) 24   Temp:    SpO2: 95%      General: Awake, no distress.  CV:  Good peripheral perfusion.  Irregular rhythm.  Rate less than 100.  No murmurs. Resp:  Normal  effort.  Slight expiratory wheeze noted, clears with coughing.  Normal work of breathing.  Speaking in full sentences. Abd:  No distention.  No tenderness. Other:  Bilateral lower extremity edema, with leg wrappings in place.  No erythema.  No tenderness.   ED Results / Procedures / Treatments   Labs (all labs ordered are listed, but only abnormal results are displayed) Labs Reviewed  BASIC METABOLIC PANEL - Abnormal; Notable for the following components:      Result Value   Glucose, Bld 114 (*)    All other components within normal limits  CBC  BRAIN NATRIURETIC PEPTIDE  TROPONIN I (HIGH SENSITIVITY)  TROPONIN I (HIGH SENSITIVITY)     EKG Chest x-ray EKG: Atrial fibrillation, left bundle branch block.  No Sgarbossa criteria.  When compared to prior, atrial fibrillation appears new.   RADIOLOGY Chest x-ray: COPD, no pneumonia, no edema   I also independently reviewed and agree wit radiologist interpretations.   PROCEDURES:  Critical Care performed: No  .  1-3 Lead EKG Interpretation Performed by: [name], MD Authorized by: [name], MD     Interpretation: abnormal     ECG rate:  70-110   ECG rate assessment: tachycardic     Rhythm: atrial fibrillation     Ectopy: none     Conduction: normal   Comments:     Indication: New onset afib      MEDICATIONS ORDERED IN ED: Medications  metoprolol tartrate (LOPRESSOR) injection 2.5 mg (2.5 mg Intravenous Given 10/24/21 1926)  apixaban (ELIQUIS) tablet 5 mg (5 mg Oral Given 10/24/21 1926)  metoprolol succinate (TOPROL-XL) 24 hr tablet 25 mg (25 mg Oral Given 10/24/21 2109)     IMPRESSION / MDM / ASSESSMENT AND PLAN / ED COURSE  I reviewed the triage vital signs and the nursing notes.                               The patient is on the cardiac monitor to evaluate for evidence of arrhythmia and/or significant heart rate changes.   Ddx:  New onset atrial fibrillation, CHF, COPD, right-sided heart  failure from his COPD, pneumonia, anemia   MDM:  Very well-appearing 81 year old male here with new onset atrial fibrillation.  Patient reportedly had wheezing and increased work of breathing at the visit though he now appears very well with normal work of breathing and scant wheezing that clears with coughing.  Patient does appear to be in new onset atrial fibrillation, though he is already anticoagulated given his history of PE and is on metoprolol, low-dose, as well.  He does have note of atrial fibrillation and previous PCP notes, but I see no evidence formally of this.  I reviewed his previous vascular evaluations as well.  Patient was given a dose of IV Lopressor as well as his dose of Eliquis.  He was ambulated throughout the ED with no significant tachycardia, hypoxia, or significant increased work of breathing.  His lab work is overall very reassuring.  No leukocytosis or anemia.  Troponins are negative x2, do not suspect ACS.  BNP is normal.  Chest x-ray shows COPD but is otherwise unremarkable with no signs of significant failure.  Patient monitored extensively in the ED with no significant increase in heart rate or evidence to suggest unstable A-fib.  While he did have some intermittent wheezing, I suspect this is more so related to his chronic COPD and on further questioning, he admits he has not been using any of his inhalers due to not being able to afford them.  Given otherwise well appearance with no reported increased shortness of breath or coughing, as well as the possibility of treatment of COPD to worsen his atrial fibrillation, will hold on steroids at this time.  I do think he would benefit from steroid inhaler.  We will increase his metoprolol from 25-50 daily given his new onset A-fib, and will start him on his regular COPD medication, which I changed to generic fluticasone/salmeterol as he has been unable to afford his other medication.  We will hold on steroids at this time as above.   Patient updated and in agreement.  His son was updated as well and at bedside, states he will help with medication adherence.   MEDICATIONS GIVEN IN ED: Medications  metoprolol tartrate (LOPRESSOR) injection 2.5 mg (2.5 mg Intravenous Given 10/24/21 1926)  apixaban (ELIQUIS) tablet 5 mg (5 mg Oral Given 10/24/21 1926)  metoprolol succinate (TOPROL-XL)  24 hr tablet 25 mg (25 mg Oral Given 10/24/21 2109)     Consults:  None   EMR reviewed  PCP note from Dr. Derrel Nip today noting his work of breathing and A-fib, note from 1/11 noting his COPD as well as a mention of A-fib     FINAL CLINICAL IMPRESSION(S) / ED DIAGNOSES   Final diagnoses:  New onset atrial fibrillation [facility])     Rx / DC Orders   ED Discharge Orders          Ordered    Fluticasone-Salmeterol (AIRDUO RESPICLICK 166/06) 004-59 MCG/ACT AEPB  2 times daily        10/24/21 2059    metoprolol succinate (TOPROL-XL) 25 MG 24 hr tablet  2 times daily        10/24/21 2059    traMADol (ULTRAM) 50 MG tablet  Every 6 hours PRN        10/24/21 2139             Note:  This document was prepared using Dragon voice recognition software and may include unintentional dictation errors.   [name], MD 10/24/21 2236

## 2021-10-24 NOTE — Assessment & Plan Note (Signed)
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  Currently under the care of Dr Jacqualine Code in [place] with compression wraps

## 2021-10-24 NOTE — Assessment & Plan Note (Signed)
Chronicity unclear as he has not been seen in several years.  I have ordered and reviewed a 12 lead EKG and find that there are multiple changes  Including RBBB and LAFB.  Previous EKG from 2019 notes sinus rhythm,  No conduction delays.  He has no local or recent cardiologist; sending to ER for evaluation and treatment

## 2021-10-24 NOTE — Assessment & Plan Note (Signed)
He continues to use Eliquis  For lifelong anticoagulation.  CBC is normal

## 2021-10-25 ENCOUNTER — Telehealth: Payer: Self-pay

## 2021-10-25 DIAGNOSIS — I83023 Varicose veins of left lower extremity with ulcer of ankle: Secondary | ICD-10-CM | POA: Diagnosis not present

## 2021-10-25 DIAGNOSIS — I83012 Varicose veins of right lower extremity with ulcer of calf: Secondary | ICD-10-CM | POA: Diagnosis not present

## 2021-10-25 DIAGNOSIS — I83013 Varicose veins of right lower extremity with ulcer of ankle: Secondary | ICD-10-CM | POA: Diagnosis not present

## 2021-10-25 DIAGNOSIS — I83022 Varicose veins of left lower extremity with ulcer of calf: Secondary | ICD-10-CM | POA: Diagnosis not present

## 2021-10-25 NOTE — Chronic Care Management (AMB) (Signed)
°  Chronic Care Management   Outreach Note  10/25/2021 Name: Seth CAVITT Sr. MRN: [id number] DOB: 1940-11-25  Seth Bales Sr. is a 81 y.o. year old male who is a primary care patient of Derrel Nip, Aris Everts, MD. I reached out to [place]. by phone today in response to a referral sent by Mr. Seth Bales Sr.'s primary care provider.  An unsuccessful telephone outreach was attempted today. The patient was referred to the case management team for assistance with care management and care coordination.   Follow Up Plan: The care management team will reach out to the patient again over the next 5 days.  If patient returns call to provider office, please advise to call [place] * at [phone]*  Noreene Larsson, [place], [place] Management  [place], [place] [address] Direct Dial: [phone] [email or web address]: [email or web address]

## 2021-10-29 DIAGNOSIS — I83012 Varicose veins of right lower extremity with ulcer of calf: Secondary | ICD-10-CM | POA: Diagnosis not present

## 2021-10-29 DIAGNOSIS — I83013 Varicose veins of right lower extremity with ulcer of ankle: Secondary | ICD-10-CM | POA: Diagnosis not present

## 2021-10-29 DIAGNOSIS — I83022 Varicose veins of left lower extremity with ulcer of calf: Secondary | ICD-10-CM | POA: Diagnosis not present

## 2021-10-29 DIAGNOSIS — I83023 Varicose veins of left lower extremity with ulcer of ankle: Secondary | ICD-10-CM | POA: Diagnosis not present

## 2021-10-31 NOTE — Chronic Care Management (AMB) (Signed)
Chronic Care Management   Note  10/31/2021 Name: Seth MANNIS Sr. MRN: [id number] DOB: [birth year]  Seth Bales Sr. is a 81 y.o. year old male who is a primary care patient of Derrel Nip, Aris Everts, MD. I reached out to [place]. by phone today in response to a referral sent by Mr. Seth Bales Sr.'s PCP.  Seth Perez information about Chronic Care Management services today including:  CCM service includes personalized support from designated clinical staff supervised by his physician, including individualized plan of care and coordination with other care providers 24/7 contact phone numbers for assistance for urgent and routine care needs. Service will only be billed when office clinical staff spend 20 minutes or more in a month to coordinate care. Only one practitioner may furnish and bill the service in a calendar month. The patient may stop CCM services at any time (effective at the end of the month) by phone call to the office staff. The patient is responsible for co-pay (up to 20% after annual deductible is met) if co-pay is required by the individual health plan.   Patient agreed to services and verbal consent obtained.   Follow up plan: Telephone appointment with care management team member scheduled for:11/14/2021  Noreene Larsson, [place], [place], [place] [address] Direct Dial: [phone] [email or web address]: [email or web address]

## 2021-11-01 DIAGNOSIS — I83022 Varicose veins of left lower extremity with ulcer of calf: Secondary | ICD-10-CM | POA: Diagnosis not present

## 2021-11-01 DIAGNOSIS — I83023 Varicose veins of left lower extremity with ulcer of ankle: Secondary | ICD-10-CM | POA: Diagnosis not present

## 2021-11-01 DIAGNOSIS — I83013 Varicose veins of right lower extremity with ulcer of ankle: Secondary | ICD-10-CM | POA: Diagnosis not present

## 2021-11-01 DIAGNOSIS — I83012 Varicose veins of right lower extremity with ulcer of calf: Secondary | ICD-10-CM | POA: Diagnosis not present

## 2021-11-05 DIAGNOSIS — I83013 Varicose veins of right lower extremity with ulcer of ankle: Secondary | ICD-10-CM | POA: Diagnosis not present

## 2021-11-05 DIAGNOSIS — I83023 Varicose veins of left lower extremity with ulcer of ankle: Secondary | ICD-10-CM | POA: Diagnosis not present

## 2021-11-05 DIAGNOSIS — I83022 Varicose veins of left lower extremity with ulcer of calf: Secondary | ICD-10-CM | POA: Diagnosis not present

## 2021-11-05 DIAGNOSIS — I83012 Varicose veins of right lower extremity with ulcer of calf: Secondary | ICD-10-CM | POA: Diagnosis not present

## 2021-11-08 ENCOUNTER — Ambulatory Visit: Payer: Medicare Other

## 2021-11-08 ENCOUNTER — Telehealth: Payer: Self-pay

## 2021-11-08 DIAGNOSIS — I83023 Varicose veins of left lower extremity with ulcer of ankle: Secondary | ICD-10-CM | POA: Diagnosis not present

## 2021-11-08 DIAGNOSIS — I83013 Varicose veins of right lower extremity with ulcer of ankle: Secondary | ICD-10-CM | POA: Diagnosis not present

## 2021-11-08 DIAGNOSIS — I83022 Varicose veins of left lower extremity with ulcer of calf: Secondary | ICD-10-CM | POA: Diagnosis not present

## 2021-11-08 DIAGNOSIS — I83012 Varicose veins of right lower extremity with ulcer of calf: Secondary | ICD-10-CM | POA: Diagnosis not present

## 2021-11-08 NOTE — Telephone Encounter (Addendum)
Pt stated he called back at 1:17 and that he was waiting for a long time for someone to talk to him. Pt stated he does not want to be charged for the visit. Pt stated his fingers would not allow him to slide the phone to answer it. Pt did reschedule for 3/14

## 2021-11-08 NOTE — Telephone Encounter (Signed)
No answer when called for scheduled AWV. Appointment was confirmed yesterday. Reschedule as appropriate.

## 2021-11-14 ENCOUNTER — Ambulatory Visit (INDEPENDENT_AMBULATORY_CARE_PROVIDER_SITE_OTHER): Payer: Medicare Other | Admitting: Pharmacist

## 2021-11-14 DIAGNOSIS — I83013 Varicose veins of right lower extremity with ulcer of ankle: Secondary | ICD-10-CM | POA: Diagnosis not present

## 2021-11-14 DIAGNOSIS — I2699 Other pulmonary embolism without acute cor pulmonale: Secondary | ICD-10-CM

## 2021-11-14 DIAGNOSIS — I83022 Varicose veins of left lower extremity with ulcer of calf: Secondary | ICD-10-CM | POA: Diagnosis not present

## 2021-11-14 DIAGNOSIS — I83023 Varicose veins of left lower extremity with ulcer of ankle: Secondary | ICD-10-CM | POA: Diagnosis not present

## 2021-11-14 DIAGNOSIS — J432 Centrilobular emphysema: Secondary | ICD-10-CM

## 2021-11-14 DIAGNOSIS — I83012 Varicose veins of right lower extremity with ulcer of calf: Secondary | ICD-10-CM | POA: Diagnosis not present

## 2021-11-14 MED ORDER — UMECLIDINIUM BROMIDE 62.5 MCG/ACT IN AEPB: 1.0000 | INHALATION_SPRAY | Freq: Every day | RESPIRATORY_TRACT | 2 refills | Status: DC

## 2021-11-14 MED ORDER — ALBUTEROL SULFATE HFA 108 (90 BASE) MCG/ACT IN AERS: 2.0000 | INHALATION_SPRAY | Freq: Four times a day (QID) | RESPIRATORY_TRACT | 11 refills | Status: DC | PRN

## 2021-11-14 NOTE — Patient Instructions (Signed)
Visit Information   Following is a copy of your full care plan:  Care Plan : Medication Management  Updates made by De Hollingshead, RPH-CPP since 11/14/2021 12:00 AM     Problem: Atrial Fibrillation, Hyperlipidemia, COPD      Long-Range Goal: Disease Progression Prevention   Start Date: 11/14/2021  This Visit's Progress: On track  Priority: High  Note:   Current Barriers:  Unable to independently monitor therapeutic efficacy   Pharmacist Clinical Goal(s):  patient will achieve adherence to monitoring guidelines and medication adherence to achieve therapeutic efficacy through collaboration with PharmD and provider.    Interventions: 1:1 collaboration with [name], MD regarding development and update of comprehensive plan of care as evidenced by provider attestation and co-signature Inter-disciplinary care team collaboration (see longitudinal plan of care) Comprehensive medication review performed; medication list updated in electronic medical record  Health Maintenance   Yearly influenza vaccination: due - declined  Td/Tdap vaccination: up to date Pneumonia vaccination: up to date COVID vaccinations: due  Shingrix vaccinations: due  Hyperlipidemia:   Controlled; current treatment: simvastatin 20 mg daily Recommended to continue current regimen at this time  Atrial Fibrillation: Appropriately well controlled; current rate/rhythm control: metoprolol succinate 25 mg twice daily; anticoagulant treatment: Eliquis 5 mg BID; also has IVC filter in place Home blood pressure, heart rate readings: 108-110/60-70; HR 50-60s Advised continued home BP readings and HR readings.  Discussed benefit of cardiology referral. Scheduled follow up with PCP.   Chronic Obstructive Pulmonary Disease:   Uncontrolled; current treatment: fluticasone/salmeterol HFA 113/14 mcg- reports [facility] did not cover this medication/didn't have GoodRx  Most recent Pulmonary Function Testing: 11/24/2012  simple spirometry>> ratio 65%, FEV1 2.53 L (64% predicted) 0 exacerbations requiring treatment in the last 6 months, though hospitalization in 04/2021 for COPD Reports he was not taking Incruse prior to recent visit with atrial fibrillation. Hospitalist attempted to give script for generic Advair for cheaper cost, but pharmacy did not have correct formulation to dispense with GoodRx card.  Reviewed income documentation from prior [facility] pharmacists. Income over the income limit for available LAMA or LAMA/LABA patient assistance programs. Patient resistant to daily inhaler as he does not believe he has COPD, believes prior breathing problems were all related to prior PE. Marland Kitchen Restart Incruse daily, updated script for albuterol HFA PRN. Will call pharmacy tomorrow to discuss copay.    Patient Goals/Self-Care Activities patient will:  - take medications as prescribed as evidenced by patient report and record review collaborate with provider on medication access solutions      Consent to CCM Services: Mr. Ferns was given information about Chronic Care Management services including:  CCM service includes personalized support from designated clinical staff supervised by his physician, including individualized plan of care and coordination with other care providers 24/7 contact phone numbers for assistance for urgent and routine care needs. Service will only be billed when office clinical staff spend 20 minutes or more in a month to coordinate care. Only one practitioner may furnish and bill the service in a calendar month. The patient may stop CCM services at any time (effective at the end of the month) by phone call to the office staff. The patient will be responsible for cost sharing (co-pay) of up to 20% of the service fee (after annual deductible is met).  Patient agreed to services and verbal consent obtained.   Plan: Telephone follow up appointment with care management team member scheduled  for:  8 weeks  Catie Darnelle Maffucci,  PharmD, [place], CPP Clinical Pharmacist [place] at [facility] [phone]   Please call the care guide team at [phone] if you need to cancel or reschedule your appointment.   Patient verbalizes understanding of instructions and care plan provided today and agrees to view in [place]. Active MyChart status confirmed with patient.

## 2021-11-14 NOTE — Chronic Care Management (AMB) (Signed)
Chronic Care Management CCM Pharmacy Note  11/14/2021 Name:  Seth ALBORNOZ Sr. MRN:  [id number] DOB:  1941-07-09  Summary: - Tolerating metoprolol dose increase  - Prescribed generic Advair in ED, but was still expensive.   Recommendations/Changes made from today's visit: - Restart Incruse daily. Over income for assistance - Scheduled follow up with PCP regarding new afib diagnosis   Subjective: Seth CLEARMAN Sr. is an 81 y.o. year old male who is a primary patient of [place], Aris Everts, MD.  The CCM team was consulted for assistance with disease management and care coordination needs.    Engaged with patient by telephone for initial visit for pharmacy case management and/or care coordination services.   Objective:  Medications Reviewed Today     Reviewed by De Hollingshead, RPH-CPP (Pharmacist) on 11/14/21 at 1332  Med List Status: <None>   Medication Order Taking? Sig Documenting Provider Last Dose Status Informant  albuterol (VENTOLIN HFA) 108 (90 Base) MCG/ACT inhaler [id number] No Inhale 2 puffs into the lungs every 6 (six) hours as needed for wheezing. Reported on 02/28/2016  Patient not taking: Reported on 11/14/2021   Seth Mc, MD Not Taking Active Other  apixaban (ELIQUIS) 5 MG TABS tablet [id number] Yes Take 1 tablet (5 mg total) by mouth 2 (two) times daily. Seth Mc, MD Taking Active   Cobalamin Combinations (B-12) 1000-400 MCG SUBL [id number] Yes Place 1,000 mcg under the tongue daily. Seth Mc, MD Taking Active   Fluticasone-Salmeterol [facility] RESPICLICK 016/01) 093-23 MCG/ACT AEPB [id number] No Inhale 1 puff into the lungs in the morning and at bedtime.  Patient not taking: Reported on 11/14/2021   Perez Bruce, MD Not Taking Active   metoprolol succinate (TOPROL-XL) 25 MG 24 hr tablet [id number] Yes Take 1 tablet (25 mg total) by mouth in the morning and at bedtime. Perez Bruce, MD Taking Active   Potassium 99 MG TABS [id number] Yes Take 1  tablet by mouth daily. [provider] Taking Active   simvastatin (ZOCOR) 20 MG tablet [id number] Yes TAKE 1 TABLET BY MOUTH EVERYDAY AT BEDTIME Seth Mc, MD Taking Active   traMADol (ULTRAM) 50 MG tablet [id number] No Take 1 tablet (50 mg total) by mouth every 6 (six) hours as needed for severe pain.  Patient not taking: Reported on 11/14/2021   Perez Bruce, MD Not Taking Active   Triamcinolone Acetonide 0.025 % LOTN [id number] Yes APPLY TOPICALLY TO AFFECTED AREA 3 TIMES DAILY FOR 7 DAYS [provider] Taking Active   VITAMIN D, CHOLECALCIFEROL, PO [id number] Yes Take 5,000 Units by mouth daily. [provider] Taking Active Other            Pertinent Labs:   Lab Results  Component Value Date   HGBA1C 5.5 09/22/2017   Lab Results  Component Value Date   CHOL 116 10/17/2021   HDL 35.40 (L) 10/17/2021   LDLCALC 63 10/17/2021   TRIG 89.0 10/17/2021   CHOLHDL 3 10/17/2021   Lab Results  Component Value Date   CREATININE 0.86 10/24/2021   BUN 17 10/24/2021   NA 139 10/24/2021   K 3.8 10/24/2021   CL 106 10/24/2021   CO2 27 10/24/2021    SDOH:  (Social Determinants of Health) assessments and interventions performed:  SDOH Interventions    Flowsheet Row Most Recent Value  SDOH Interventions   Financial Strain Interventions Other (Comment)  [over income for [place]  Review of patient past medical history, allergies, medications, health status, including review of consultants reports, laboratory and other test data, was performed as part of comprehensive evaluation and provision of chronic care management services.   Care Plan : Medication Management  Updates made by De Hollingshead, RPH-CPP since 11/14/2021 12:00 AM     Problem: Atrial Fibrillation, Hyperlipidemia, COPD      Long-Range Goal: Disease Progression Prevention   Start Date: 11/14/2021  This Visit's Progress: On track  Priority: High  Note:    Current Barriers:  Unable to independently monitor therapeutic efficacy   Pharmacist Clinical Goal(s):  patient will achieve adherence to monitoring guidelines and medication adherence to achieve therapeutic efficacy through collaboration with PharmD and provider.    Interventions: 1:1 collaboration with Seth Mc, MD regarding development and update of comprehensive plan of care as evidenced by provider attestation and co-signature Inter-disciplinary care team collaboration (see longitudinal plan of care) Comprehensive medication review performed; medication list updated in electronic medical record  Health Maintenance   Yearly influenza vaccination: due - declined  Td/Tdap vaccination: up to date Pneumonia vaccination: up to date COVID vaccinations: due  Shingrix vaccinations: due  Hyperlipidemia:   Controlled; current treatment: simvastatin 20 mg daily Recommended to continue current regimen at this time  Atrial Fibrillation: Appropriately well controlled; current rate/rhythm control: metoprolol succinate 25 mg twice daily; anticoagulant treatment: Eliquis 5 mg BID; also has IVC filter in place Home blood pressure, heart rate readings: 108-110/60-70; HR 50-60s Advised continued home BP readings and HR readings.  Discussed benefit of cardiology referral. Scheduled follow up with PCP.   Chronic Obstructive Pulmonary Disease:   Uncontrolled; current treatment: fluticasone/salmeterol HFA 113/14 mcg- reports [facility] did not cover this medication/didn't have GoodRx  Most recent Pulmonary Function Testing: 11/24/2012 simple spirometry>> ratio 65%, FEV1 2.53 L (64% predicted) 0 exacerbations requiring treatment in the last 6 months, though hospitalization in 04/2021 for COPD Reports he was not taking Incruse prior to recent visit with atrial fibrillation. Hospitalist attempted to give script for generic Advair for cheaper cost, but pharmacy did not have correct formulation to  dispense with GoodRx card.  Reviewed income documentation from prior [facility] pharmacists. Income over the income limit for available LAMA or LAMA/LABA patient assistance programs. Patient resistant to daily inhaler as he does not believe he has COPD, believes prior breathing problems were all related to prior PE. Marland Kitchen Restart Incruse daily, updated script for albuterol HFA PRN. Will call pharmacy tomorrow to discuss copay.    Patient Goals/Self-Care Activities patient will:  - take medications as prescribed as evidenced by patient report and record review collaborate with provider on medication access solutions       Plan: Telephone follow up appointment with care management team member scheduled for:  8 weeks  Catie Darnelle Maffucci, PharmD, [place], CPP Clinical Pharmacist [employer] at [employer] [phone]

## 2021-11-16 ENCOUNTER — Telehealth: Payer: Self-pay | Admitting: Pharmacist

## 2021-11-16 NOTE — Telephone Encounter (Signed)
Called pharmacy to follow up on copays.   Incruse was covered by his insurance. Appears he might have a deductible, so copay was $115 for a month supply. Albuterol HFA copay was $10.   Called patient. Left voicemail for him to return my call at his convenience.

## 2021-11-20 DIAGNOSIS — E785 Hyperlipidemia, unspecified: Secondary | ICD-10-CM | POA: Diagnosis not present

## 2021-11-20 DIAGNOSIS — J432 Centrilobular emphysema: Secondary | ICD-10-CM | POA: Diagnosis not present

## 2021-11-20 DIAGNOSIS — I4891 Unspecified atrial fibrillation: Secondary | ICD-10-CM | POA: Diagnosis not present

## 2021-11-21 DIAGNOSIS — I83023 Varicose veins of left lower extremity with ulcer of ankle: Secondary | ICD-10-CM | POA: Diagnosis not present

## 2021-11-21 DIAGNOSIS — I83013 Varicose veins of right lower extremity with ulcer of ankle: Secondary | ICD-10-CM | POA: Diagnosis not present

## 2021-11-21 DIAGNOSIS — I83012 Varicose veins of right lower extremity with ulcer of calf: Secondary | ICD-10-CM | POA: Diagnosis not present

## 2021-11-21 DIAGNOSIS — I83022 Varicose veins of left lower extremity with ulcer of calf: Secondary | ICD-10-CM | POA: Diagnosis not present

## 2021-11-27 ENCOUNTER — Ambulatory Visit: Payer: Self-pay | Admitting: Pharmacist

## 2021-11-27 NOTE — Chronic Care Management (AMB) (Signed)
?  Chronic Care Management  ? ?Note ? ?11/27/2021 ?Name: Seth PIPE Sr. MRN: [id number] DOB: 10/18/1940 ? ? ? ?Closing pharmacy CCM case at this time. Will collaborate with Care Guide to outreach to schedule follow up with RN CM. Patient has clinic contact information for future questions or concerns.  ? ?Catie Darnelle Maffucci, PharmD, [place], CPP ?Clinical Pharmacist ?Therapist, music at [employer] ?[phone] ? ?

## 2021-11-27 NOTE — Telephone Encounter (Signed)
Pt returning call. Pt requesting callback.  °

## 2021-11-28 DIAGNOSIS — I83013 Varicose veins of right lower extremity with ulcer of ankle: Secondary | ICD-10-CM | POA: Diagnosis not present

## 2021-11-28 DIAGNOSIS — I83023 Varicose veins of left lower extremity with ulcer of ankle: Secondary | ICD-10-CM | POA: Diagnosis not present

## 2021-11-28 DIAGNOSIS — I83012 Varicose veins of right lower extremity with ulcer of calf: Secondary | ICD-10-CM | POA: Diagnosis not present

## 2021-11-28 DIAGNOSIS — I83022 Varicose veins of left lower extremity with ulcer of calf: Secondary | ICD-10-CM | POA: Diagnosis not present

## 2021-11-28 NOTE — Telephone Encounter (Addendum)
Called patient back. He continues to note that he does not think he has COPD. He has picked up 2 scripts of Advair, filled the Incruse, and also picked up albuterol.  ? ?Discussed that I would recommend he use Incruse daily. We discussed concepts of maintenance and rescue inhalers. He is resistant to a maintenance inhaler as he does not think he has lung disease. We reviewed the results of his 2014 PFTs. I wonder if updated PFTs may be appropriate to help evaluate current lung function and direct therapy, as well as reiterate conditions to patient. I encouraged him to call Pulmonary to schedule follow up with Dr. Patsey Berthold.  ? ?Advised to at least take albuterol as needed for shortness of breath, coughing, or wheezing if he chooses not to talk a maintenance inhaler and follow up with Dr. Derrel Nip later this month as scheduled. ?

## 2021-12-03 DIAGNOSIS — Z7901 Long term (current) use of anticoagulants: Secondary | ICD-10-CM | POA: Diagnosis not present

## 2021-12-03 DIAGNOSIS — Z20822 Contact with and (suspected) exposure to covid-19: Secondary | ICD-10-CM | POA: Diagnosis present

## 2021-12-03 DIAGNOSIS — R509 Fever, unspecified: Secondary | ICD-10-CM | POA: Diagnosis not present

## 2021-12-03 DIAGNOSIS — I499 Cardiac arrhythmia, unspecified: Secondary | ICD-10-CM | POA: Diagnosis not present

## 2021-12-03 DIAGNOSIS — I890 Lymphedema, not elsewhere classified: Secondary | ICD-10-CM | POA: Diagnosis present

## 2021-12-03 DIAGNOSIS — J440 Chronic obstructive pulmonary disease with acute lower respiratory infection: Secondary | ICD-10-CM | POA: Diagnosis present

## 2021-12-03 DIAGNOSIS — G9340 Encephalopathy, unspecified: Secondary | ICD-10-CM | POA: Diagnosis not present

## 2021-12-03 DIAGNOSIS — I4819 Other persistent atrial fibrillation: Secondary | ICD-10-CM | POA: Diagnosis not present

## 2021-12-03 DIAGNOSIS — L03116 Cellulitis of left lower limb: Secondary | ICD-10-CM | POA: Diagnosis present

## 2021-12-03 DIAGNOSIS — Z86718 Personal history of other venous thrombosis and embolism: Secondary | ICD-10-CM | POA: Diagnosis not present

## 2021-12-03 DIAGNOSIS — A419 Sepsis, unspecified organism: Secondary | ICD-10-CM | POA: Diagnosis present

## 2021-12-03 DIAGNOSIS — R918 Other nonspecific abnormal finding of lung field: Secondary | ICD-10-CM | POA: Diagnosis not present

## 2021-12-03 DIAGNOSIS — J441 Chronic obstructive pulmonary disease with (acute) exacerbation: Secondary | ICD-10-CM | POA: Diagnosis present

## 2021-12-03 DIAGNOSIS — R000 Tachycardia, unspecified: Secondary | ICD-10-CM | POA: Diagnosis not present

## 2021-12-03 DIAGNOSIS — Z86711 Personal history of pulmonary embolism: Secondary | ICD-10-CM | POA: Diagnosis not present

## 2021-12-03 DIAGNOSIS — Z79899 Other long term (current) drug therapy: Secondary | ICD-10-CM | POA: Diagnosis not present

## 2021-12-03 DIAGNOSIS — R059 Cough, unspecified: Secondary | ICD-10-CM | POA: Diagnosis not present

## 2021-12-03 DIAGNOSIS — J9601 Acute respiratory failure with hypoxia: Secondary | ICD-10-CM | POA: Diagnosis present

## 2021-12-03 DIAGNOSIS — I10 Essential (primary) hypertension: Secondary | ICD-10-CM | POA: Diagnosis not present

## 2021-12-03 DIAGNOSIS — R4182 Altered mental status, unspecified: Secondary | ICD-10-CM | POA: Diagnosis not present

## 2021-12-03 DIAGNOSIS — F17200 Nicotine dependence, unspecified, uncomplicated: Secondary | ICD-10-CM | POA: Diagnosis not present

## 2021-12-03 DIAGNOSIS — J189 Pneumonia, unspecified organism: Secondary | ICD-10-CM | POA: Diagnosis present

## 2021-12-03 DIAGNOSIS — I4891 Unspecified atrial fibrillation: Secondary | ICD-10-CM | POA: Diagnosis present

## 2021-12-03 DIAGNOSIS — R404 Transient alteration of awareness: Secondary | ICD-10-CM | POA: Diagnosis not present

## 2021-12-04 ENCOUNTER — Telehealth: Payer: Self-pay

## 2021-12-04 ENCOUNTER — Ambulatory Visit: Payer: Medicare Other

## 2021-12-04 DIAGNOSIS — I4891 Unspecified atrial fibrillation: Secondary | ICD-10-CM | POA: Diagnosis present

## 2021-12-04 DIAGNOSIS — I4819 Other persistent atrial fibrillation: Secondary | ICD-10-CM | POA: Diagnosis not present

## 2021-12-04 DIAGNOSIS — A419 Sepsis, unspecified organism: Secondary | ICD-10-CM | POA: Diagnosis present

## 2021-12-04 DIAGNOSIS — Z86711 Personal history of pulmonary embolism: Secondary | ICD-10-CM | POA: Diagnosis not present

## 2021-12-04 DIAGNOSIS — R918 Other nonspecific abnormal finding of lung field: Secondary | ICD-10-CM | POA: Diagnosis not present

## 2021-12-04 DIAGNOSIS — R059 Cough, unspecified: Secondary | ICD-10-CM | POA: Diagnosis not present

## 2021-12-04 DIAGNOSIS — J189 Pneumonia, unspecified organism: Secondary | ICD-10-CM | POA: Diagnosis present

## 2021-12-04 DIAGNOSIS — J440 Chronic obstructive pulmonary disease with acute lower respiratory infection: Secondary | ICD-10-CM | POA: Diagnosis present

## 2021-12-04 DIAGNOSIS — Z79899 Other long term (current) drug therapy: Secondary | ICD-10-CM | POA: Diagnosis not present

## 2021-12-04 DIAGNOSIS — R4182 Altered mental status, unspecified: Secondary | ICD-10-CM | POA: Diagnosis not present

## 2021-12-04 DIAGNOSIS — R509 Fever, unspecified: Secondary | ICD-10-CM | POA: Diagnosis not present

## 2021-12-04 DIAGNOSIS — Z7901 Long term (current) use of anticoagulants: Secondary | ICD-10-CM | POA: Diagnosis not present

## 2021-12-04 DIAGNOSIS — F17200 Nicotine dependence, unspecified, uncomplicated: Secondary | ICD-10-CM | POA: Diagnosis present

## 2021-12-04 DIAGNOSIS — Z20822 Contact with and (suspected) exposure to covid-19: Secondary | ICD-10-CM | POA: Diagnosis present

## 2021-12-04 DIAGNOSIS — J441 Chronic obstructive pulmonary disease with (acute) exacerbation: Secondary | ICD-10-CM | POA: Diagnosis present

## 2021-12-04 DIAGNOSIS — J9601 Acute respiratory failure with hypoxia: Secondary | ICD-10-CM | POA: Diagnosis present

## 2021-12-04 DIAGNOSIS — I890 Lymphedema, not elsewhere classified: Secondary | ICD-10-CM | POA: Diagnosis present

## 2021-12-04 DIAGNOSIS — Z86718 Personal history of other venous thrombosis and embolism: Secondary | ICD-10-CM | POA: Diagnosis not present

## 2021-12-04 DIAGNOSIS — L03116 Cellulitis of left lower limb: Secondary | ICD-10-CM | POA: Diagnosis present

## 2021-12-04 NOTE — Telephone Encounter (Signed)
Unable to reach patient. No answer. Left message to reschedule.  ?

## 2021-12-05 DIAGNOSIS — A419 Sepsis, unspecified organism: Secondary | ICD-10-CM | POA: Diagnosis not present

## 2021-12-06 DIAGNOSIS — A419 Sepsis, unspecified organism: Secondary | ICD-10-CM | POA: Diagnosis not present

## 2021-12-10 ENCOUNTER — Ambulatory Visit (INDEPENDENT_AMBULATORY_CARE_PROVIDER_SITE_OTHER): Payer: Medicare Other | Admitting: Internal Medicine

## 2021-12-10 ENCOUNTER — Other Ambulatory Visit: Payer: Self-pay

## 2021-12-10 ENCOUNTER — Encounter: Payer: Self-pay | Admitting: Internal Medicine

## 2021-12-10 VITALS — BP 150/84 | HR 106 | Temp 97.6°F | Ht 76.0 in | Wt 256.6 lb

## 2021-12-10 DIAGNOSIS — I878 Other specified disorders of veins: Secondary | ICD-10-CM | POA: Diagnosis not present

## 2021-12-10 DIAGNOSIS — I5043 Acute on chronic combined systolic (congestive) and diastolic (congestive) heart failure: Secondary | ICD-10-CM

## 2021-12-10 DIAGNOSIS — I890 Lymphedema, not elsewhere classified: Secondary | ICD-10-CM

## 2021-12-10 DIAGNOSIS — I4891 Unspecified atrial fibrillation: Secondary | ICD-10-CM | POA: Diagnosis not present

## 2021-12-10 MED ORDER — METOPROLOL SUCCINATE ER 25 MG PO TB24: 25.0000 mg | ORAL_TABLET | Freq: Two times a day (BID) | ORAL | 0 refills | Status: DC

## 2021-12-10 NOTE — Progress Notes (Signed)
? ?Subjective:  ?Patient ID: Seth Perez., male    DOB: [birth year]  Age: 81 y.o. MRN: [id number] ? ?CC: The primary encounter diagnosis was Lymphedema. Diagnoses of Atrial fibrillation, unspecified type [facility]), Lower extremity venous stasis, and Acute on chronic combined systolic and diastolic heart failure ([place]) were also pertinent to this visit. ? ? ?This visit occurred during the SARS-CoV-2 public health emergency.  Safety protocols were in place, including screening questions prior to the visit, additional usage of staff PPE, and extensive cleaning of exam room while observing appropriate contact time as indicated for disinfecting solutions.   ? ?HPI ?Seth Bales Sr. presents for follow up on multiple issues . Patient was hospitalized at [facility]  in [place] recently for acute hypoxic respiratory failure secondary to RLL pneumonia and COPD excacerbation.  He was treated for sepsis with broad spectrum antibiotics and discharged on prednisone and cefdinit. ?Chief Complaint  ?Patient presents with  ? Follow-up  ?  Follow up on atrial fibrillation   ? ?1) atrial fibrillation: dose of metoprolol was reduced during recent hospitalization for sepsis/PNA/COPD excacerbation with delirium  ?Cefpoxime 2 more doses,  last 2 doses of prednisone as well.  Breathing ok,  denies cough ? ?2) lymphedema:  seeing wound care .  Wearing compression stockings. DOES NOT PUMP .      He states that his podiatrist wraps his legs in [facility] boot and he has been waiting since November to see a  vascular surgeon that he was referred. .  He has seen Dr Lucky Cowboy in the past but apparenlty has never been prescribed lymphedema garments or a pump  ? ?3) COPD exacerbation secondary to pneumonia with delirium and lethargy.  Acute respiratory failure.  Does not remember being taken to the hospital  ? ?4)  ?Outpatient Medications Prior to Visit  ?Medication Sig Dispense Refill  ? ADVAIR DISKUS 250-50 MCG/ACT AEPB Inhale 1 puff into the lungs 2  (two) times daily.    ? albuterol (VENTOLIN HFA) 108 (90 Base) MCG/ACT inhaler Inhale 2 puffs into the lungs every 6 (six) hours as needed for wheezing. 6.7 g 11  ? apixaban (ELIQUIS) 5 MG TABS tablet Take 1 tablet (5 mg total) by mouth 2 (two) times daily. 60 tablet 0  ? cefpodoxime (VANTIN) 200 MG tablet Take 400 mg by mouth 2 (two) times daily.    ? Cobalamin Combinations (B-12) 1000-400 MCG SUBL Place 1,000 mcg under the tongue daily. 90 tablet 3  ? Potassium 99 MG TABS Take 1 tablet by mouth daily.    ? predniSONE (DELTASONE) 20 MG tablet Take 20 mg by mouth daily.    ? simvastatin (ZOCOR) 20 MG tablet TAKE 1 TABLET BY MOUTH EVERYDAY AT BEDTIME 90 tablet 1  ? traMADol (ULTRAM) 50 MG tablet Take 1 tablet (50 mg total) by mouth every 6 (six) hours as needed for severe pain. 15 tablet 0  ? Triamcinolone Acetonide 0.025 % LOTN APPLY TOPICALLY TO AFFECTED AREA 3 TIMES DAILY FOR 7 DAYS    ? umeclidinium bromide (INCRUSE ELLIPTA) 62.5 MCG/ACT AEPB Inhale 1 puff into the lungs daily. 30 each 2  ? VITAMIN D, CHOLECALCIFEROL, PO Take 5,000 Units by mouth daily.    ? metoprolol succinate (TOPROL-XL) 25 MG 24 hr tablet Take 1 tablet (25 mg total) by mouth in the morning and at bedtime. (Patient taking differently: Take 12.5 mg by mouth in the morning and at bedtime.) 60 tablet 0  ? ?No facility-administered medications prior to  visit.  ? ? ?Review of Systems; ? ?Patient denies headache, fevers, malaise, unintentional weight loss, skin rash, eye pain, sinus congestion and sinus pain, sore throat, dysphagia,  hemoptysis , cough, dyspnea, wheezing, chest pain, palpitations, orthopnea, edema, abdominal pain, nausea, melena, diarrhea, constipation, flank pain, dysuria, hematuria, urinary  Frequency, nocturia, numbness, tingling, seizures,  Focal weakness, Loss of consciousness,  Tremor, insomnia, depression, anxiety, and suicidal ideation.   ? ? ? ?Objective:  ?BP (!) 150/84 (BP Location: Left Arm, Patient Position: Sitting,  Cuff Size: Normal)   Pulse (!) 106   Temp 97.6 ?F (36.4 ?C) (Oral)   Ht '6\' 4"'$  (1.93 m)   Wt 256 lb 9.6 oz (116.4 kg)   SpO2 96%   BMI 31.23 kg/m?  ? ?BP Readings from Last 3 Encounters:  ?12/10/21 (!) 150/84  ?10/24/21 114/83  ?10/24/21 (!) 142/90  ? ? ?Wt Readings from Last 3 Encounters:  ?12/10/21 256 lb 9.6 oz (116.4 kg)  ?10/24/21 259 lb 0.7 oz (117.5 kg)  ?10/24/21 259 lb 2 oz (117.5 kg)  ? ? ?General appearance: alert, cooperative and appears stated age ?Ears: normal TM's and external ear canals both ears ?Throat: lips, mucosa, and tongue normal; teeth and gums normal ?Neck: no adenopathy, no carotid bruit, supple, symmetrical, trachea midline and thyroid not enlarged, symmetric, no tenderness/mass/nodules ?Back: symmetric, no curvature. ROM normal. No CVA tenderness. ?Lungs: clear to auscultation bilaterally, slight egophony in the RLL ?Heart: regular rate and rhythm, S1, S2 normal, no murmur, click, rub or gallop ?Abdomen: soft, non-tender; bowel sounds normal; no masses,  no organomegaly ?Pulses: 2+ and symmetric ?Skin: bilateral LE lymphedema , bilateral compression wraps covered in compression knee highs. Lymph nodes: Cervical, supraclavicular, and axillary nodes normal. ? ?Lab Results  ?Component Value Date  ? HGBA1C 5.5 09/22/2017  ? HGBA1C 5.7 10/24/2012  ? HGBA1C 5.8 10/23/2012  ? ? ?Lab Results  ?Component Value Date  ? CREATININE 0.86 10/24/2021  ? CREATININE 0.88 10/17/2021  ? CREATININE 1.03 08/17/2019  ? ? ?Lab Results  ?Component Value Date  ? WBC 8.1 10/24/2021  ? HGB 13.7 10/24/2021  ? HCT 41.8 10/24/2021  ? PLT 181 10/24/2021  ? GLUCOSE 114 (H) 10/24/2021  ? CHOL 116 10/17/2021  ? TRIG 89.0 10/17/2021  ? HDL 35.40 (L) 10/17/2021  ? [place] 63 10/17/2021  ? ALT 10 10/17/2021  ? AST 15 10/17/2021  ? NA 139 10/24/2021  ? K 3.8 10/24/2021  ? CL 106 10/24/2021  ? CREATININE 0.86 10/24/2021  ? BUN 17 10/24/2021  ? CO2 27 10/24/2021  ? TSH 2.95 10/17/2021  ? PSA 11.44 (H) 11/24/2012  ? INR 1.5  01/26/2014  ? HGBA1C 5.5 09/22/2017  ? ? ?DG Chest 2 View ? ?Result Date: 10/24/2021 ?CLINICAL DATA:  Cardiac arrhythmia, atrial fibrillation EXAM: CHEST - 2 VIEW COMPARISON:  Previous studies including chest radiographs done on 12/18/2017 FINDINGS: Transverse diameter of heart is in the upper limits of normal. Thoracic aorta is tortuous and ectatic. Low position of diaphragms suggests COPD. Left hemidiaphragm is elevated. There are small linear densities in the lower lung fields. There is no focal consolidation. There are no signs of alveolar pulmonary edema. There is no significant pleural effusion or pneumothorax. Degenerative changes are noted in both shoulders, more so on the left side. IMPRESSION: COPD. Small linear densities in the lower lung fields suggest scarring or subsegmental atelectasis. There are no signs of pulmonary edema or new focal pulmonary consolidation. Electronically Signed   By: Royston Cowper  Rathinasamy M.D.   On: 10/24/2021 16:59  ? ? ?Assessment & Plan:  ? ?Problem List Items Addressed This Visit   ? ? RESOLVED: Acute on chronic combined systolic and diastolic heart failure ([place])  ?  He continues to require use Eliquis  For lifelong anticoagulation.   ?. ?  ?  ? Relevant Medications  ? metoprolol succinate (TOPROL-XL) 25 MG 24 hr tablet  ? Atrial fibrillation ([place])  ?  Chronicity unclear as he had not been seen in several years. ;He was sent  to ER in early February  for evaluation and treatment. He has not seen a cardiologist.  Referral in progress.  He will  resume prior metoprolol dose to manage BP and pulse.  ?  ?  ? Relevant Medications  ? metoprolol succinate (TOPROL-XL) 25 MG 24 hr tablet  ? Other Relevant Orders  ? Ambulatory referral to Cardiology  ? Lower extremity venous stasis  ?  With underlying lymphedema that has not been appropriately addressed . Referring to Dr Lucky Cowboy for evaluation and for prescription of pump  ?  ?  ? Relevant Medications  ? metoprolol succinate (TOPROL-XL) 25 MG  24 hr tablet  ? Other Relevant Orders  ? Ambulatory referral to Vascular Surgery  ? ?Other Visit Diagnoses   ? ? Lymphedema    -  Primary  ? Relevant Orders  ? Ambulatory referral to Vascular Surgery  ? ?  ?

## 2021-12-10 NOTE — Patient Instructions (Addendum)
You were hospitalized for pneumonia and COPD exacerbation that made you delirious  ? ?Your heart rate and blood pressure are elevated because the dose of metoprolol was reduced during your hospitalization  ? ?Please  increase your metoprolol to one full tablet twice daily  ? ? ?You  have lymphedema .  This needs to be managed with daily pumping using pneumatic compression wraps that massage your legs and pump the fluid back up to the heart ? ? ?Referring you to Dr. Lucky Cowboy so he can set up lymphedema pumping to manage the fluid retention in your legs  ? ?

## 2021-12-11 ENCOUNTER — Telehealth: Payer: Self-pay | Admitting: Internal Medicine

## 2021-12-11 NOTE — Assessment & Plan Note (Addendum)
Chronicity unclear as he had not been seen in several years. ;He was sent  to ER in early February  for evaluation and treatment. He has not seen a cardiologist.  Referral in progress.  He will  resume prior metoprolol dose to manage BP and pulse.  ?

## 2021-12-11 NOTE — Telephone Encounter (Signed)
Please let Seth Perez know that I have also made a referral to a cardiologist,  because anyone who has atrial fibrillation needs to have his heart examined with an ECHO and whatever else the cardiologist feels needs to be done .   ?

## 2021-12-11 NOTE — Telephone Encounter (Signed)
Spoke with pt to let him know that a referral to cardiology has been placed. Pt gave a verbal understanding.  ?

## 2021-12-11 NOTE — Assessment & Plan Note (Signed)
With underlying lymphedema that has not been appropriately addressed . Referring to Dr Lucky Cowboy for evaluation and for prescription of pump  ?

## 2021-12-11 NOTE — Assessment & Plan Note (Signed)
He continues to require use Eliquis  For lifelong anticoagulation.   ?. ?

## 2021-12-27 DIAGNOSIS — I83013 Varicose veins of right lower extremity with ulcer of ankle: Secondary | ICD-10-CM | POA: Diagnosis not present

## 2021-12-27 DIAGNOSIS — I83023 Varicose veins of left lower extremity with ulcer of ankle: Secondary | ICD-10-CM | POA: Diagnosis not present

## 2021-12-27 DIAGNOSIS — I83022 Varicose veins of left lower extremity with ulcer of calf: Secondary | ICD-10-CM | POA: Diagnosis not present

## 2021-12-27 DIAGNOSIS — I83012 Varicose veins of right lower extremity with ulcer of calf: Secondary | ICD-10-CM | POA: Diagnosis not present

## 2021-12-31 ENCOUNTER — Telehealth: Payer: Self-pay

## 2021-12-31 NOTE — Progress Notes (Signed)
error 

## 2022-01-03 DIAGNOSIS — I83022 Varicose veins of left lower extremity with ulcer of calf: Secondary | ICD-10-CM | POA: Diagnosis not present

## 2022-01-03 DIAGNOSIS — I83023 Varicose veins of left lower extremity with ulcer of ankle: Secondary | ICD-10-CM | POA: Diagnosis not present

## 2022-01-03 DIAGNOSIS — I83012 Varicose veins of right lower extremity with ulcer of calf: Secondary | ICD-10-CM | POA: Diagnosis not present

## 2022-01-03 DIAGNOSIS — I83013 Varicose veins of right lower extremity with ulcer of ankle: Secondary | ICD-10-CM | POA: Diagnosis not present

## 2022-01-08 ENCOUNTER — Telehealth: Payer: Medicare Other

## 2022-01-10 DIAGNOSIS — I83013 Varicose veins of right lower extremity with ulcer of ankle: Secondary | ICD-10-CM | POA: Diagnosis not present

## 2022-01-10 DIAGNOSIS — I83012 Varicose veins of right lower extremity with ulcer of calf: Secondary | ICD-10-CM | POA: Diagnosis not present

## 2022-01-10 DIAGNOSIS — I83023 Varicose veins of left lower extremity with ulcer of ankle: Secondary | ICD-10-CM | POA: Diagnosis not present

## 2022-01-10 DIAGNOSIS — I83022 Varicose veins of left lower extremity with ulcer of calf: Secondary | ICD-10-CM | POA: Diagnosis not present

## 2022-01-17 DIAGNOSIS — I83012 Varicose veins of right lower extremity with ulcer of calf: Secondary | ICD-10-CM | POA: Diagnosis not present

## 2022-01-17 DIAGNOSIS — I83023 Varicose veins of left lower extremity with ulcer of ankle: Secondary | ICD-10-CM | POA: Diagnosis not present

## 2022-01-17 DIAGNOSIS — I83022 Varicose veins of left lower extremity with ulcer of calf: Secondary | ICD-10-CM | POA: Diagnosis not present

## 2022-01-17 DIAGNOSIS — I83013 Varicose veins of right lower extremity with ulcer of ankle: Secondary | ICD-10-CM | POA: Diagnosis not present

## 2022-01-24 DIAGNOSIS — I83013 Varicose veins of right lower extremity with ulcer of ankle: Secondary | ICD-10-CM | POA: Diagnosis not present

## 2022-01-24 DIAGNOSIS — I83023 Varicose veins of left lower extremity with ulcer of ankle: Secondary | ICD-10-CM | POA: Diagnosis not present

## 2022-01-24 DIAGNOSIS — I83012 Varicose veins of right lower extremity with ulcer of calf: Secondary | ICD-10-CM | POA: Diagnosis not present

## 2022-01-24 DIAGNOSIS — I83022 Varicose veins of left lower extremity with ulcer of calf: Secondary | ICD-10-CM | POA: Diagnosis not present

## 2022-01-31 DIAGNOSIS — I83013 Varicose veins of right lower extremity with ulcer of ankle: Secondary | ICD-10-CM | POA: Diagnosis not present

## 2022-01-31 DIAGNOSIS — I83012 Varicose veins of right lower extremity with ulcer of calf: Secondary | ICD-10-CM | POA: Diagnosis not present

## 2022-01-31 DIAGNOSIS — I83023 Varicose veins of left lower extremity with ulcer of ankle: Secondary | ICD-10-CM | POA: Diagnosis not present

## 2022-01-31 DIAGNOSIS — I83022 Varicose veins of left lower extremity with ulcer of calf: Secondary | ICD-10-CM | POA: Diagnosis not present

## 2022-02-07 DIAGNOSIS — I83013 Varicose veins of right lower extremity with ulcer of ankle: Secondary | ICD-10-CM | POA: Diagnosis not present

## 2022-02-07 DIAGNOSIS — I83022 Varicose veins of left lower extremity with ulcer of calf: Secondary | ICD-10-CM | POA: Diagnosis not present

## 2022-02-07 DIAGNOSIS — I83012 Varicose veins of right lower extremity with ulcer of calf: Secondary | ICD-10-CM | POA: Diagnosis not present

## 2022-02-07 DIAGNOSIS — I83023 Varicose veins of left lower extremity with ulcer of ankle: Secondary | ICD-10-CM | POA: Diagnosis not present

## 2022-02-12 DIAGNOSIS — I83893 Varicose veins of bilateral lower extremities with other complications: Secondary | ICD-10-CM | POA: Diagnosis not present

## 2022-02-13 ENCOUNTER — Ambulatory Visit (INDEPENDENT_AMBULATORY_CARE_PROVIDER_SITE_OTHER): Payer: Medicare Other | Admitting: Cardiology

## 2022-02-13 ENCOUNTER — Encounter: Payer: Self-pay | Admitting: Cardiology

## 2022-02-13 VITALS — BP 132/86 | HR 91 | Ht 76.0 in | Wt 256.0 lb

## 2022-02-13 DIAGNOSIS — I447 Left bundle-branch block, unspecified: Secondary | ICD-10-CM | POA: Diagnosis not present

## 2022-02-13 DIAGNOSIS — I4819 Other persistent atrial fibrillation: Secondary | ICD-10-CM

## 2022-02-13 MED ORDER — METOPROLOL SUCCINATE ER 25 MG PO TB24: 25.0000 mg | ORAL_TABLET | Freq: Two times a day (BID) | ORAL | 3 refills | Status: DC

## 2022-02-13 NOTE — H&P (View-Only) (Signed)
Electrophysiology Office Note:    Date:  02/13/2022   ID:  Seth Bales Sr., DOB [birth year], MRN [id number]  PCP:  [name], MD  [facility] Cardiologist:  None  [facility] Electrophysiologist:  Vickie Epley, MD   Referring MD: [name], MD   Chief Complaint: Atrial fibrillation    History of Present Illness:    Seth WINDHORST Sr. is a 81 y.o. male who presents for an evaluation of atrial fibrillation at the request of Dr. Derrel Nip. Their medical history includes COPD, pulmonary embolism on Xarelto.  The patient last saw Dr. Derrel Nip December 10, 2021.  That appointment was shortly after an ER visit on October 24, 2021 for new onset atrial fibrillation.  At that ER visit his metoprolol was increased and his anticoagulant was continued.  He tells me that his biggest complaint now is his lower extremity edema which is a chronic issue for him.  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  He takes Eliquis for stroke prophylaxis.  The IVC filter is currently in place.     Past Medical History:  Diagnosis Date   Arthritis    Chicken pox    COPD (chronic obstructive pulmonary disease) ([place])    Pulmonary emboli ([place])    on Xarelto    Past Surgical History:  Procedure Laterality Date   CHOLECYSTECTOMY  2008   TONSILLECTOMY AND ADENOIDECTOMY  1947    Current Medications: Current Meds  Medication Sig   ADVAIR DISKUS 250-50 MCG/ACT AEPB Inhale 1 puff into the lungs 2 (two) times daily.   albuterol (VENTOLIN HFA) 108 (90 Base) MCG/ACT inhaler Inhale 2 puffs into the lungs every 6 (six) hours as needed for wheezing.   apixaban (ELIQUIS) 5 MG TABS tablet Take 1 tablet (5 mg total) by mouth 2 (two) times daily.   cefpodoxime (VANTIN) 200 MG tablet Take 400 mg by mouth 2 (two) times daily.   Cobalamin Combinations (B-12) 1000-400 MCG SUBL Place 1,000 mcg under the tongue daily.   Potassium 99 MG TABS Take 1 tablet by mouth daily.    predniSONE (DELTASONE) 20 MG tablet Take 20 mg by mouth daily.   simvastatin (ZOCOR) 20 MG tablet TAKE 1 TABLET BY MOUTH EVERYDAY AT BEDTIME   traMADol (ULTRAM) 50 MG tablet Take 1 tablet (50 mg total) by mouth every 6 (six) hours as needed for severe pain.   Triamcinolone Acetonide 0.025 % LOTN APPLY TOPICALLY TO AFFECTED AREA 3 TIMES DAILY FOR 7 DAYS   umeclidinium bromide (INCRUSE ELLIPTA) 62.5 MCG/ACT AEPB Inhale 1 puff into the lungs daily.   VITAMIN D, CHOLECALCIFEROL, PO Take 5,000 Units by mouth daily.   [DISCONTINUED] metoprolol succinate (TOPROL-XL) 25 MG 24 hr tablet Take 1 tablet (25 mg total) by mouth in the morning and at bedtime.     Allergies:   Adhesive [tape]   Social History   Socioeconomic History   Marital status: Divorced    Spouse name: Not on file   Number of children: Not on file   Years of education: Not on file   Highest education level: Not on file  Occupational History   Not on file  Tobacco Use   Smoking status: Former    Packs/day: 1.00    Years: 59.00    Pack years: 59.00    Types: Cigarettes    Quit date: 10/12/2012    Years since quitting: 9.3   Smokeless tobacco: Never  Vaping  Use   Vaping Use: Never used  Substance and Sexual Activity   Alcohol use: Yes    Alcohol/week: 2.0 standard drinks    Types: 2 Standard drinks or equivalent per week   Drug use: No   Sexual activity: Not Currently  Other Topics Concern   Not on file  Social History Narrative   Not on file   Social Determinants of Health   Financial Resource Strain: Medium Risk   Difficulty of Paying Living Expenses: Somewhat hard  Food Insecurity: Not on file  Transportation Needs: Not on file  Physical Activity: Not on file  Stress: Not on file  Social Connections: Not on file     Family History: The patient's family history includes Arthritis in his mother; Asthma in his father; Breast cancer in his maternal grandmother; Emphysema in his father; Heart disease in his  maternal grandfather and paternal grandfather; Lung cancer in his brother.  ROS:   Please see the history of present illness.    All other systems reviewed and are negative.  EKGs/Labs/Other Studies Reviewed:    The following studies were reviewed today:  September 24, 2017 EKG shows sinus arrhythmia October 24, 2021 EKG shows atrial fibrillation and a left bundle branch block  EKG:  The ekg ordered today demonstrates atrial fibrillation and a left bundle branch block   Recent Labs: 10/17/2021: ALT 10; TSH 2.95 10/24/2021: B Natriuretic Peptide 98.5; BUN 17; Creatinine, Ser 0.86; Hemoglobin 13.7; Platelets 181; Potassium 3.8; Sodium 139  Recent Lipid Panel    Component Value Date/Time   CHOL 116 10/17/2021 1030   CHOL 151 11/19/2012 0147   TRIG 89.0 10/17/2021 1030   TRIG 71 11/19/2012 0147   HDL 35.40 (L) 10/17/2021 1030   HDL 26 (L) 11/19/2012 0147   CHOLHDL 3 10/17/2021 1030   VLDL 17.8 10/17/2021 1030   VLDL 14 11/19/2012 0147   LDLCALC 63 10/17/2021 1030   LDLCALC 111 (H) 11/19/2012 0147    Physical Exam:    VS:  BP 132/86   Pulse 91   Ht '6\' 4"'$  (1.93 m)   Wt 256 lb (116.1 kg)   SpO2 94%   BMI 31.16 kg/m     Wt Readings from Last 3 Encounters:  02/13/22 256 lb (116.1 kg)  12/10/21 256 lb 9.6 oz (116.4 kg)  10/24/21 259 lb 0.7 oz (117.5 kg)     GEN: Elderly in no distress  HEENT: Normal NECK: No JVD; No carotid bruits LYMPHATICS: No lymphadenopathy CARDIAC: Irregularly irregular, no murmurs, rubs, gallops RESPIRATORY:  Clear to auscultation without rales, wheezing or rhonchi  ABDOMEN: Soft, non-tender, non-distended MUSCULOSKELETAL: 2-3+ bilateral lower extremity edema with wraps in place; No deformity  SKIN: Warm and dry NEUROLOGIC:  Alert and oriented x 3 PSYCHIATRIC:  Normal affect       ASSESSMENT:    1. Persistent atrial fibrillation ([place])   2. Left bundle branch block    PLAN:    In order of problems listed above:  #Persistent atrial  fibrillation Relatively new diagnosis.  On Eliquis for stroke prophylaxis.  He has evidence of diastolic heart failure and I believe he would benefit from being back in normal rhythm.  I would like to get a cardioversion scheduled for the patient.  I discussed the cardioversion procedure in detail with the patient occluding the risks and he wishes to proceed with scheduling.  An extensive amount of time was spent discussing the pathophysiology of atrial fibrillation.  We also discussed the need  for anticoagulation for stroke prophylaxis.  We discussed the link between diastolic heart failure and atrial arrhythmias.  #chronic diastolic heart failure #Left bundle branch block Order echo to reassess LV function in the setting of left bundle and significant lower extremity edema.    Follow-up 3 months    Total time spent with patient today 60 minutes. This includes reviewing records, evaluating the patient and coordinating care.  Medication Adjustments/Labs and Tests Ordered: Current medicines are reviewed at length with the patient today.  Concerns regarding medicines are outlined above.  Orders Placed This Encounter  Procedures   EKG 12-Lead   ECHOCARDIOGRAM COMPLETE   Meds ordered this encounter  Medications   metoprolol succinate (TOPROL-XL) 25 MG 24 hr tablet    Sig: Take 1 tablet (25 mg total) by mouth in the morning and at bedtime.    Dispense:  180 tablet    Refill:  3     Signed, Adelheid Hoggard T. Quentin Ore, MD, [facility], [facility] 02/13/2022 5:24 PM    Electrophysiology [facility]

## 2022-02-13 NOTE — Progress Notes (Signed)
Electrophysiology Office Note:    Date:  02/13/2022   ID:  Seth Bales Sr., DOB 1941/01/16, MRN [id number]  PCP:  [name], MD  [facility] Cardiologist:  None  [facility] Electrophysiologist:  Vickie Epley, MD   Referring MD: [name], MD   Chief Complaint: Atrial fibrillation    History of Present Illness:    Seth GELLERT Sr. is a 81 y.o. male who presents for an evaluation of atrial fibrillation at the request of Dr. Derrel Nip. Their medical history includes COPD, pulmonary embolism on Xarelto.  The patient last saw Dr. Derrel Nip December 10, 2021.  That appointment was shortly after an ER visit on October 24, 2021 for new onset atrial fibrillation.  At that ER visit his metoprolol was increased and his anticoagulant was continued.  He tells me that his biggest complaint now is his lower extremity edema which is a chronic issue for him.  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  He takes Eliquis for stroke prophylaxis.  The IVC filter is currently in place.     Past Medical History:  Diagnosis Date   Arthritis    Chicken pox    COPD (chronic obstructive pulmonary disease) ([place])    Pulmonary emboli ([place])    on Xarelto    Past Surgical History:  Procedure Laterality Date   CHOLECYSTECTOMY  2008   TONSILLECTOMY AND ADENOIDECTOMY  1947    Current Medications: Current Meds  Medication Sig   ADVAIR DISKUS 250-50 MCG/ACT AEPB Inhale 1 puff into the lungs 2 (two) times daily.   albuterol (VENTOLIN HFA) 108 (90 Base) MCG/ACT inhaler Inhale 2 puffs into the lungs every 6 (six) hours as needed for wheezing.   apixaban (ELIQUIS) 5 MG TABS tablet Take 1 tablet (5 mg total) by mouth 2 (two) times daily.   cefpodoxime (VANTIN) 200 MG tablet Take 400 mg by mouth 2 (two) times daily.   Cobalamin Combinations (B-12) 1000-400 MCG SUBL Place 1,000 mcg under the tongue daily.   Potassium 99 MG TABS Take 1 tablet by mouth daily.    predniSONE (DELTASONE) 20 MG tablet Take 20 mg by mouth daily.   simvastatin (ZOCOR) 20 MG tablet TAKE 1 TABLET BY MOUTH EVERYDAY AT BEDTIME   traMADol (ULTRAM) 50 MG tablet Take 1 tablet (50 mg total) by mouth every 6 (six) hours as needed for severe pain.   Triamcinolone Acetonide 0.025 % LOTN APPLY TOPICALLY TO AFFECTED AREA 3 TIMES DAILY FOR 7 DAYS   umeclidinium bromide (INCRUSE ELLIPTA) 62.5 MCG/ACT AEPB Inhale 1 puff into the lungs daily.   VITAMIN D, CHOLECALCIFEROL, PO Take 5,000 Units by mouth daily.   [DISCONTINUED] metoprolol succinate (TOPROL-XL) 25 MG 24 hr tablet Take 1 tablet (25 mg total) by mouth in the morning and at bedtime.     Allergies:   Adhesive [tape]   Social History   Socioeconomic History   Marital status: Divorced    Spouse name: Not on file   Number of children: Not on file   Years of education: Not on file   Highest education level: Not on file  Occupational History   Not on file  Tobacco Use   Smoking status: Former    Packs/day: 1.00    Years: 59.00    Pack years: 59.00    Types: Cigarettes    Quit date: 10/12/2012    Years since quitting: 9.3   Smokeless tobacco: Never  Vaping  Use   Vaping Use: Never used  Substance and Sexual Activity   Alcohol use: Yes    Alcohol/week: 2.0 standard drinks    Types: 2 Standard drinks or equivalent per week   Drug use: No   Sexual activity: Not Currently  Other Topics Concern   Not on file  Social History Narrative   Not on file   Social Determinants of Health   Financial Resource Strain: Medium Risk   Difficulty of Paying Living Expenses: Somewhat hard  Food Insecurity: Not on file  Transportation Needs: Not on file  Physical Activity: Not on file  Stress: Not on file  Social Connections: Not on file     Family History: The patient's family history includes Arthritis in his mother; Asthma in his father; Breast cancer in his maternal grandmother; Emphysema in his father; Heart disease in his  maternal grandfather and paternal grandfather; Lung cancer in his brother.  ROS:   Please see the history of present illness.    All other systems reviewed and are negative.  EKGs/Labs/Other Studies Reviewed:    The following studies were reviewed today:  September 24, 2017 EKG shows sinus arrhythmia October 24, 2021 EKG shows atrial fibrillation and a left bundle branch block  EKG:  The ekg ordered today demonstrates atrial fibrillation and a left bundle branch block   Recent Labs: 10/17/2021: ALT 10; TSH 2.95 10/24/2021: B Natriuretic Peptide 98.5; BUN 17; Creatinine, Ser 0.86; Hemoglobin 13.7; Platelets 181; Potassium 3.8; Sodium 139  Recent Lipid Panel    Component Value Date/Time   CHOL 116 10/17/2021 1030   CHOL 151 11/19/2012 0147   TRIG 89.0 10/17/2021 1030   TRIG 71 11/19/2012 0147   HDL 35.40 (L) 10/17/2021 1030   HDL 26 (L) 11/19/2012 0147   CHOLHDL 3 10/17/2021 1030   VLDL 17.8 10/17/2021 1030   VLDL 14 11/19/2012 0147   LDLCALC 63 10/17/2021 1030   LDLCALC 111 (H) 11/19/2012 0147    Physical Exam:    VS:  BP 132/86   Pulse 91   Ht '6\' 4"'$  (1.93 m)   Wt 256 lb (116.1 kg)   SpO2 94%   BMI 31.16 kg/m     Wt Readings from Last 3 Encounters:  02/13/22 256 lb (116.1 kg)  12/10/21 256 lb 9.6 oz (116.4 kg)  10/24/21 259 lb 0.7 oz (117.5 kg)     GEN: Elderly in no distress  HEENT: Normal NECK: No JVD; No carotid bruits LYMPHATICS: No lymphadenopathy CARDIAC: Irregularly irregular, no murmurs, rubs, gallops RESPIRATORY:  Clear to auscultation without rales, wheezing or rhonchi  ABDOMEN: Soft, non-tender, non-distended MUSCULOSKELETAL: 2-3+ bilateral lower extremity edema with wraps in place; No deformity  SKIN: Warm and dry NEUROLOGIC:  Alert and oriented x 3 PSYCHIATRIC:  Normal affect       ASSESSMENT:    1. Persistent atrial fibrillation ([place])   2. Left bundle branch block    PLAN:    In order of problems listed above:  #Persistent atrial  fibrillation Relatively new diagnosis.  On Eliquis for stroke prophylaxis.  He has evidence of diastolic heart failure and I believe he would benefit from being back in normal rhythm.  I would like to get a cardioversion scheduled for the patient.  I discussed the cardioversion procedure in detail with the patient occluding the risks and he wishes to proceed with scheduling.  An extensive amount of time was spent discussing the pathophysiology of atrial fibrillation.  We also discussed the need  for anticoagulation for stroke prophylaxis.  We discussed the link between diastolic heart failure and atrial arrhythmias.  #chronic diastolic heart failure #Left bundle branch block Order echo to reassess LV function in the setting of left bundle and significant lower extremity edema.    Follow-up 3 months    Total time spent with patient today 60 minutes. This includes reviewing records, evaluating the patient and coordinating care.  Medication Adjustments/Labs and Tests Ordered: Current medicines are reviewed at length with the patient today.  Concerns regarding medicines are outlined above.  Orders Placed This Encounter  Procedures   EKG 12-Lead   ECHOCARDIOGRAM COMPLETE   Meds ordered this encounter  Medications   metoprolol succinate (TOPROL-XL) 25 MG 24 hr tablet    Sig: Take 1 tablet (25 mg total) by mouth in the morning and at bedtime.    Dispense:  180 tablet    Refill:  3     Signed, Sintia Mckissic T. Quentin Ore, MD, [facility], [facility] 02/13/2022 5:24 PM    Electrophysiology [facility]

## 2022-02-13 NOTE — Patient Instructions (Addendum)
Medications: Your physician recommends that you continue on your current medications as directed. Please refer to the Current Medication list given to you today. *If you need a refill on your cardiac medications before your next appointment, please call your pharmacy*  Lab Work: None. If you have labs (blood work) drawn today and your tests are completely normal, you will receive your results only by: [place] (if you have MyChart) OR A paper copy in the mail If you have any lab test that is abnormal or we need to change your treatment, we will call you to review the results.  Testing/Procedures: Your physician has requested that you have an echocardiogram. Echocardiography is a painless test that uses sound waves to create images of your heart. It provides your doctor with information about the size and shape of your heart and how well your heart's chambers and valves are working. This procedure takes approximately one hour. There are no restrictions for this procedure.   Your physician has recommended that you have a Cardioversion (DCCV). Electrical Cardioversion uses a jolt of electricity to your heart either through paddles or wired patches attached to your chest. This is a controlled, usually prescheduled, procedure. Defibrillation is done under light anesthesia in the hospital, and you usually go home the day of the procedure. This is done to get your heart back into a normal rhythm. You are not awake for the procedure. Please see the instruction sheet given to you today.   Follow-Up: At [facility], you and your health needs are our priority.  As part of our continuing mission to provide you with exceptional heart care, we have created designated Provider Care Teams.  These Care Teams include your primary Cardiologist (physician) and Advanced Practice Providers (APPs -  Physician Assistants and Nurse Practitioners) who all work together to provide you with the care you need, when  you need it.  Your physician wants you to follow-up in: 3 months with Seth Mage, MD   We recommend signing up for the patient portal called "MyChart".  Sign up information is provided on this After Visit Summary.  MyChart is used to connect with patients for Virtual Visits (Telemedicine).  Patients are able to view lab/test results, encounter notes, upcoming appointments, etc.  Non-urgent messages can be sent to your provider as well.   To learn more about what you can do with MyChart, go to [email or web address].    Any Other Special Instructions Will Be Listed Below (If Applicable).  You are scheduled for a Cardioversion on June 2 with Dr. Fletcher Anon Please arrive at the [place] at 6:30 a.m. on the day of your procedure.  DIET INSTRUCTIONS:  Nothing to eat or drink after midnight except your medications with a              sip of water.        Medications:  YOU MAY TAKE ALL of your medications with a small amount of water.  Must have a responsible person to drive you home.  Bring a current list of your medications and current insurance cards.    If you have any questions after you get home, please call the office at [phone]

## 2022-02-15 ENCOUNTER — Other Ambulatory Visit: Payer: Self-pay | Admitting: Internal Medicine

## 2022-02-15 DIAGNOSIS — J432 Centrilobular emphysema: Secondary | ICD-10-CM

## 2022-02-21 NOTE — Anesthesia Preprocedure Evaluation (Signed)
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   Neuro/Psych HOH    GI/Hepatic negative GI ROS,   Endo/Other  negative endocrine ROS  Renal/GU negative Renal ROS     Musculoskeletal  (+) Arthritis ,   Abdominal   Peds  Hematology negative hematology ROS (+)   Anesthesia Other Findings   Reproductive/Obstetrics                            Anesthesia Physical Anesthesia Plan  ASA: 3  Anesthesia Plan: General   Post-op Pain Management:    Induction: Intravenous  PONV Risk Score and Plan: 2 and Propofol infusion, TIVA and Treatment may vary due to age or medical condition  Airway Management Planned: Natural Airway  Additional Equipment:   Intra-op Plan:   Post-operative Plan:   Informed Consent: I have reviewed the patients History and Physical, chart, labs and discussed the procedure including the risks, benefits and alternatives for the proposed anesthesia with the patient or authorized representative who has indicated his/her understanding and acceptance.       Plan Discussed with: CRNA  Anesthesia Plan Comments: (LMA/GETA backup discussed.  Patient consented for risks of anesthesia including but not limited to:  - adverse reactions to medications - damage to eyes, teeth, lips or other oral mucosa - nerve  damage due to positioning  - sore throat or hoarseness - damage to heart, brain, nerves, lungs, other parts of body or loss of life  Informed patient about role of CRNA in peri- and intra-operative care.  Patient voiced understanding.)       Anesthesia Quick Evaluation

## 2022-02-22 ENCOUNTER — Ambulatory Visit: Payer: Medicare Other | Admitting: Anesthesiology

## 2022-02-22 ENCOUNTER — Encounter
Admission: RE | Disposition: A | Discharge status: Home / Self Care | Payer: Self-pay | Source: Ambulatory Visit | Attending: Cardiovascular Disease

## 2022-02-22 ENCOUNTER — Ambulatory Visit
Admission: RE | Admit: 2022-02-22 | Discharge: 2022-02-22 | Disposition: A | Discharge status: Home / Self Care | Payer: Medicare Other | Source: Ambulatory Visit | Attending: Cardiovascular Disease | Admitting: Cardiovascular Disease

## 2022-02-22 ENCOUNTER — Encounter: Payer: Self-pay | Admitting: Cardiovascular Disease

## 2022-02-22 DIAGNOSIS — Z79899 Other long term (current) drug therapy: Secondary | ICD-10-CM | POA: Insufficient documentation

## 2022-02-22 DIAGNOSIS — I4891 Unspecified atrial fibrillation: Secondary | ICD-10-CM

## 2022-02-22 DIAGNOSIS — Z7901 Long term (current) use of anticoagulants: Secondary | ICD-10-CM | POA: Insufficient documentation

## 2022-02-22 DIAGNOSIS — I447 Left bundle-branch block, unspecified: Secondary | ICD-10-CM | POA: Insufficient documentation

## 2022-02-22 DIAGNOSIS — I5032 Chronic diastolic (congestive) heart failure: Secondary | ICD-10-CM | POA: Insufficient documentation

## 2022-02-22 DIAGNOSIS — M1990 Unspecified osteoarthritis, unspecified site: Secondary | ICD-10-CM | POA: Diagnosis not present

## 2022-02-22 DIAGNOSIS — Z86711 Personal history of pulmonary embolism: Secondary | ICD-10-CM | POA: Insufficient documentation

## 2022-02-22 DIAGNOSIS — J449 Chronic obstructive pulmonary disease, unspecified: Secondary | ICD-10-CM | POA: Insufficient documentation

## 2022-02-22 DIAGNOSIS — I4819 Other persistent atrial fibrillation: Secondary | ICD-10-CM | POA: Insufficient documentation

## 2022-02-22 DIAGNOSIS — Z87891 Personal history of nicotine dependence: Secondary | ICD-10-CM | POA: Insufficient documentation

## 2022-02-22 HISTORY — PX: CARDIOVERSION: SHX1299

## 2022-02-22 SURGERY — CARDIOVERSION
Anesthesia: General

## 2022-02-22 MED ORDER — PROPOFOL 10 MG/ML IV BOLUS
INTRAVENOUS | Status: AC
  Filled 2022-02-22: qty 20

## 2022-02-22 MED ORDER — ACETAMINOPHEN 325 MG PO TABS: 650.0000 mg | ORAL_TABLET | Freq: Once | ORAL | Status: DC | PRN

## 2022-02-22 MED ORDER — PROPOFOL 10 MG/ML IV BOLUS
INTRAVENOUS | Status: DC | PRN
  Administered 2022-02-22: 50 mg via INTRAVENOUS

## 2022-02-22 MED ORDER — ACETAMINOPHEN 160 MG/5ML PO SOLN: 325.0000 mg | ORAL | Status: DC | PRN

## 2022-02-22 MED ORDER — SODIUM CHLORIDE 0.9 % IV SOLN: INTRAVENOUS | Status: DC

## 2022-02-22 MED ORDER — ONDANSETRON HCL 4 MG/2ML IJ SOLN: 4.0000 mg | Freq: Once | INTRAMUSCULAR | Status: DC | PRN

## 2022-02-22 NOTE — Interval H&P Note (Signed)
History and Physical Interval Note:  02/22/2022 7:47 AM  Seth Bales Sr.  has presented today for surgery, with the diagnosis of Cardioversion   Afib.  The various methods of treatment have been discussed with the patient and family. After consideration of risks, benefits and other options for treatment, the patient has consented to  Procedure(s): CARDIOVERSION (N/A) as a surgical intervention.  The patient's history has been reviewed, patient examined, no change in status, stable for surgery.  I have reviewed the patient's chart and labs.  Questions were answered to the patient's satisfaction.     Kathlyn [place]

## 2022-02-22 NOTE — Transfer of Care (Signed)
Immediate Anesthesia Transfer of Care Note  Patient: Seth Bales Sr.  Procedure(s) Performed: CARDIOVERSION  Patient Location: Cath Lab  Anesthesia Type:General  Level of Consciousness: awake, alert  and oriented  Airway & Oxygen Therapy: Patient Spontanous Breathing and Patient connected to nasal cannula oxygen  Post-op Assessment: Report given to RN and Post -op Vital signs reviewed and stable  Post vital signs: Reviewed and stable  Last Vitals:  Vitals Value Taken Time  BP 111/81 02/22/22 0742  Temp    Pulse 84 02/22/22 0743  Resp 21 02/22/22 0743  SpO2 94 % 02/22/22 0743    Last Pain: There were no vitals filed for this visit.       Complications: No notable events documented.

## 2022-02-22 NOTE — CV Procedure (Signed)
Cardioversion note: A standard informed consent was obtained. Timeout was performed. The pads were placed in the anterior posterior fashion. The patient was given propofol by the anesthesia team.  Successful cardioversion was performed with a 200 J. The patient converted to sinus rhythm. Pre-and post EKGs were reviewed. The patient tolerated the procedure with no immediate complications.  Recommendations: Continue same medications and follow-up in 2-3 weeks.  

## 2022-02-22 NOTE — Anesthesia Postprocedure Evaluation (Signed)
Anesthesia Post Note  Patient: Seth Bales Sr.  Procedure(s) Performed: CARDIOVERSION  Patient location during evaluation: PACU Anesthesia Type: General Level of consciousness: awake and alert, oriented and patient cooperative Pain management: pain level controlled Vital Signs Assessment: post-procedure vital signs reviewed and stable Respiratory status: spontaneous breathing, nonlabored ventilation and respiratory function stable Cardiovascular status: blood pressure returned to baseline and stable Postop Assessment: adequate PO intake Anesthetic complications: no   No notable events documented.   Last Vitals:  Vitals:   02/22/22 0743 02/22/22 0745  BP:  106/67  Pulse: 84 85  Resp: (!) 21 17  Temp:    SpO2: 94% 97%    Last Pain: There were no vitals filed for this visit.               Darrin Nipper

## 2022-02-22 NOTE — Anesthesia Procedure Notes (Signed)
Date/Time: 02/22/2022 7:37 AM Performed by: Lily Peer, Jakki Doughty, CRNA Pre-anesthesia Checklist: Patient identified, Emergency Drugs available, Suction available, Patient being monitored and Timeout performed Patient Re-evaluated:Patient Re-evaluated prior to induction Oxygen Delivery Method: Nasal cannula Induction Type: IV induction

## 2022-02-27 DIAGNOSIS — I83023 Varicose veins of left lower extremity with ulcer of ankle: Secondary | ICD-10-CM | POA: Diagnosis not present

## 2022-02-27 DIAGNOSIS — I83022 Varicose veins of left lower extremity with ulcer of calf: Secondary | ICD-10-CM | POA: Diagnosis not present

## 2022-02-27 DIAGNOSIS — I83012 Varicose veins of right lower extremity with ulcer of calf: Secondary | ICD-10-CM | POA: Diagnosis not present

## 2022-02-27 DIAGNOSIS — I83013 Varicose veins of right lower extremity with ulcer of ankle: Secondary | ICD-10-CM | POA: Diagnosis not present

## 2022-03-04 ENCOUNTER — Other Ambulatory Visit
Admission: RE | Admit: 2022-03-04 | Discharge: 2022-03-04 | Disposition: A | Discharge status: Home / Self Care | Payer: Medicare Other | Attending: Physician Assistant | Admitting: Physician Assistant

## 2022-03-04 ENCOUNTER — Encounter: Payer: Self-pay | Admitting: Physician Assistant

## 2022-03-04 ENCOUNTER — Telehealth: Payer: Self-pay | Admitting: *Deleted

## 2022-03-04 ENCOUNTER — Ambulatory Visit (INDEPENDENT_AMBULATORY_CARE_PROVIDER_SITE_OTHER): Payer: Medicare Other

## 2022-03-04 ENCOUNTER — Ambulatory Visit (INDEPENDENT_AMBULATORY_CARE_PROVIDER_SITE_OTHER): Payer: Medicare Other | Admitting: Physician Assistant

## 2022-03-04 VITALS — BP 118/76 | HR 100 | Ht 75.0 in | Wt 254.0 lb

## 2022-03-04 DIAGNOSIS — I447 Left bundle-branch block, unspecified: Secondary | ICD-10-CM

## 2022-03-04 DIAGNOSIS — I2510 Atherosclerotic heart disease of native coronary artery without angina pectoris: Secondary | ICD-10-CM | POA: Diagnosis not present

## 2022-03-04 DIAGNOSIS — I5032 Chronic diastolic (congestive) heart failure: Secondary | ICD-10-CM

## 2022-03-04 DIAGNOSIS — I4819 Other persistent atrial fibrillation: Secondary | ICD-10-CM | POA: Insufficient documentation

## 2022-03-04 DIAGNOSIS — I2584 Coronary atherosclerosis due to calcified coronary lesion: Secondary | ICD-10-CM

## 2022-03-04 DIAGNOSIS — M7989 Other specified soft tissue disorders: Secondary | ICD-10-CM

## 2022-03-04 LAB — BASIC METABOLIC PANEL
Anion gap: 7 (ref 5–15)
BUN: 15 mg/dL (ref 8–23)
CO2: 28 mmol/L (ref 22–32)
Calcium: 9 mg/dL (ref 8.9–10.3)
Chloride: 104 mmol/L (ref 98–111)
Creatinine, Ser: 0.91 mg/dL (ref 0.61–1.24)
GFR, Estimated: >60 mL/min (ref >60)
Glucose, Bld: 112 mg/dL — ABNORMAL HIGH (ref 70–99)
Potassium: 4.3 mmol/L (ref 3.5–5.1)
Sodium: 139 mmol/L (ref 135–145)

## 2022-03-04 LAB — ECHOCARDIOGRAM COMPLETE
AR max vel: 3.43 cm2
AV Area VTI: 3.11 cm2
AV Area mean vel: 3.07 cm2
AV Mean grad: 3.7 mmHg
AV Peak grad: 7.4 mmHg
Ao pk vel: 1.36 m/s
Calc EF: 52.8 %
Height: 75 in
S' Lateral: 3.4 cm
Single Plane A2C EF: 57.7 %
Single Plane A4C EF: 50 %
Weight: 4064 oz

## 2022-03-04 NOTE — Telephone Encounter (Signed)
-----   Message from Rise Mu, PA-C sent at 03/04/2022 11:55 AM EDT ----- Please inform patient his renal function and potassium are okay.  Random glucose okay.  Await echo.

## 2022-03-04 NOTE — Progress Notes (Signed)
Cardiology Office Note    Date:  03/04/2022   ID:  Seth Bales Sr., DOB 07-08-1941, MRN [id number]  PCP:  [name], MD  Cardiologist:  None  Electrophysiologist:  Vickie Epley, MD   Chief Complaint: Follow-up  History of Present Illness:   Seth Berrocal. is a 81 y.o. male with history of coronary artery calcification, persistent A-fib status post DCCV on 02/22/2022 on Eliquis, HFpEF, recurrent PE status post IVC filter and on anticoagulation, COPD, chronic lower extremity swelling, and LBBB who presents for follow-up of DCCV.  He was diagnosed with new onset A-fib in the ED on 10/24/2021.  He was already on anticoagulation at that time given history of recurrent PE.  Metoprolol was titrated.  He established care with Dr. Quentin Ore in 01/2022, at which time he was without symptoms of angina or decompensation.  He remained in A-fib.  Echo was ordered and is scheduled for later today.  He underwent successful DCCV with a 200 J shock on 02/22/2022.  He comes in today accompanied by his wife and is doing reasonably well from a cardiac perspective.  He is without symptoms of angina or decompensation.  He does feel like he feels a little bit better following restoration of sinus rhythm.  He did note some worsening of his lower extremity swelling, for which he began retaking an old furosemide prescription from 2017.  With this, he has noted some improvement in his lower extremity swelling.  With regards to his lower extremity swelling, he is followed by vascular surgery.  He has been using Ace wraps.  He does elevate his legs somewhat when sitting at home.  He has not missed any doses of anticoagulation.  No falls or symptoms concerning for bleeding since he was last seen.  No symptoms of dizziness, presyncope, or syncope.   Labs independently reviewed: 11/2021 - potassium 4.1, BUN 30, serum creatinine 0.97, Hgb 13.0, PLT 124, albumin 3.6, AST/ALT normal 09/2021 - TC 116, TG 89, HDL 35,  LDL 63, TSH normal  Past Medical History:  Diagnosis Date   Arthritis    Chicken pox    COPD (chronic obstructive pulmonary disease) (HCC)    Pulmonary emboli (HCC)    on Xarelto    Past Surgical History:  Procedure Laterality Date   CARDIOVERSION N/A 02/22/2022   Procedure: CARDIOVERSION;  Surgeon: Wellington Hampshire, MD;  Location: [facility] ORS;  Service: Cardiovascular;  Laterality: N/A;   CHOLECYSTECTOMY  2008   TONSILLECTOMY AND ADENOIDECTOMY  1947    Current Medications: Current Meds  Medication Sig   ADVAIR DISKUS 250-50 MCG/ACT AEPB Inhale 1 puff into the lungs 2 (two) times daily.   albuterol (VENTOLIN HFA) 108 (90 Base) MCG/ACT inhaler Inhale 2 puffs into the lungs every 6 (six) hours as needed for wheezing.   apixaban (ELIQUIS) 5 MG TABS tablet Take 1 tablet (5 mg total) by mouth 2 (two) times daily.   Cobalamin Combinations (B-12) 1000-400 MCG SUBL Place 1,000 mcg under the tongue daily.   INCRUSE ELLIPTA 62.5 MCG/ACT AEPB TAKE 1 PUFF BY MOUTH EVERY DAY   metoprolol succinate (TOPROL-XL) 25 MG 24 hr tablet Take 1 tablet (25 mg total) by mouth in the morning and at bedtime.   Potassium 99 MG TABS Take 1 tablet by mouth daily.   simvastatin (ZOCOR) 20 MG tablet TAKE 1 TABLET BY MOUTH EVERYDAY AT BEDTIME   traMADol (ULTRAM) 50 MG tablet Take 1 tablet (50 mg total) by mouth  every 6 (six) hours as needed for severe pain.   triamcinolone (KENALOG) 0.025 % ointment Apply 1 application  topically as needed.   VITAMIN D, CHOLECALCIFEROL, PO Take 5,000 Units by mouth daily.    Allergies:   Adhesive [tape]   Social History   Socioeconomic History   Marital status: Divorced    Spouse name: Not on file   Number of children: Not on file   Years of education: Not on file   Highest education level: Not on file  Occupational History   Not on file  Tobacco Use   Smoking status: Some Days    Packs/day: 1.00    Years: 59.00    Total pack years: 59.00    Types: Cigarettes    Last  attempt to quit: 10/12/2012    Years since quitting: 9.3   Smokeless tobacco: Never  Vaping Use   Vaping Use: Never used  Substance and Sexual Activity   Alcohol use: Not Currently    Comment: rare   Drug use: No   Sexual activity: Not Currently  Other Topics Concern   Not on file  Social History Narrative   Not on file   Social Determinants of Health   Financial Resource Strain: Medium Risk (11/14/2021)   Overall Financial Resource Strain (CARDIA)    Difficulty of Paying Living Expenses: Somewhat hard  Food Insecurity: Unknown (09/19/2017)   Hunger Vital Sign    Worried About Running Out of Food in the Last Year: Patient refused    [place] in the Last Year: Patient refused  Transportation Needs: No Transportation Needs (08/22/2020)   PRAPARE - Hydrologist (Medical): No    Lack of Transportation (Non-Medical): No  Physical Activity: Unknown (08/22/2020)   Exercise Vital Sign    Days of Exercise per Week: 0 days    Minutes of Exercise per Session: Not on file  Stress: No Stress Concern Present (08/22/2020)   [place]    Feeling of Stress : Not at all  Social Connections: Unknown (08/22/2020)   Social Connection and Isolation Panel [NHANES]    Frequency of Communication with Friends and Family: Not on file    Frequency of Social Gatherings with Friends and Family: Not on file    Attends Religious Services: Not on file    Active Member of Clubs or Organizations: Not on file    Attends Archivist Meetings: Not on file    Marital Status: Married     Family History:  The patient's family history includes Arthritis in his mother; Asthma in his father; Breast cancer in his maternal grandmother; Emphysema in his father; Heart disease in his maternal grandfather and paternal grandfather; Lung cancer in his brother.  ROS:   12-point review of systems is negative  unless otherwise noted in the HPI.   EKGs/Labs/Other Studies Reviewed:    Studies reviewed were summarized above. The additional studies were reviewed today:  2D echo 03/04/2022: Pending __________  EKG:  EKG is ordered today.  The EKG ordered today demonstrates NSR, 100 bpm, LBBB (known)  Recent Labs: 10/17/2021: ALT 10; TSH 2.95 10/24/2021: B Natriuretic Peptide 98.5; Hemoglobin 13.7; Platelets 181 03/04/2022: BUN 15; Creatinine, Ser 0.91; Potassium 4.3; Sodium 139  Recent Lipid Panel    Component Value Date/Time   CHOL 116 10/17/2021 1030   CHOL 151 11/19/2012 0147   TRIG 89.0 10/17/2021 1030   TRIG 71 11/19/2012  0147   HDL 35.40 (L) 10/17/2021 1030   HDL 26 (L) 11/19/2012 0147   CHOLHDL 3 10/17/2021 1030   VLDL 17.8 10/17/2021 1030   VLDL 14 11/19/2012 0147   LDLCALC 63 10/17/2021 1030   LDLCALC 111 (H) 11/19/2012 0147    PHYSICAL EXAM:    VS:  BP 118/76 (BP Location: Left Arm, Patient Position: Sitting, Cuff Size: Large)   Pulse 100   Ht '6\' 3"'$  (1.905 m)   Wt 254 lb (115.2 kg)   SpO2 97%   BMI 31.75 kg/m   BMI: Body mass index is 31.75 kg/m.  Physical Exam Constitutional:      Appearance: He is well-developed.  HENT:     Head: Normocephalic and atraumatic.  Eyes:     General:        Right eye: No discharge.        Left eye: No discharge.  Neck:     Vascular: No JVD.  Cardiovascular:     Rate and Rhythm: Normal rate and regular rhythm.     Heart sounds: Normal heart sounds, S1 normal and S2 normal. Heart sounds not distant. No midsystolic click and no opening snap. No murmur heard.    No friction rub.  Pulmonary:     Effort: Pulmonary effort is normal. No respiratory distress.     Breath sounds: Normal breath sounds. No decreased breath sounds, wheezing or rales.  Chest:     Chest wall: No tenderness.  Abdominal:     General: There is no distension.     Palpations: Abdomen is soft.     Tenderness: There is no abdominal tenderness.  Musculoskeletal:      Cervical back: Normal range of motion.     Right lower leg: Edema present.     Left lower leg: Edema present.     Comments: Bilateral 2-3 + lower extremity edema noted with wraps in place.  Skin:    General: Skin is warm and dry.     Nails: There is no clubbing.  Neurological:     Mental Status: He is alert and oriented to person, place, and time.  Psychiatric:        Speech: Speech normal.        Behavior: Behavior normal.        Thought Content: Thought content normal.        Judgment: Judgment normal.     Wt Readings from Last 3 Encounters:  03/04/22 254 lb (115.2 kg)  02/22/22 254 lb (115.2 kg)  02/13/22 256 lb (116.1 kg)     ASSESSMENT & PLAN:   Persistent A-fib: Maintaining sinus rhythm following DCCV.  He remains on Toprol-XL 25 mg twice daily.  CHA2DS2-VASc 4 (CHF, age x2, vascular disease).  He remains on Eliquis 5 mg twice daily (does not meet reduced dosing criteria) without any symptoms concerning for bleeding and recent labs stable as outlined above.  HFpEF: He appears well compensated.  Echo is pending.  Coronary artery calcification: Noted on CT imaging.  LDL of 63 in 09/2021.  He is on apixaban in place of aspirin to minimize bleeding risk.  Prior high-sensitivity troponin negative.  He remains on simvastatin.  No symptoms concerning for angina.  LBBB: Stable.  No symptoms of presyncope or syncope.  Echo pending.  History of DVT with recurrent PE: Status post IVC filter.  He remains on indefinite anticoagulation as outlined above.  Recent labs stable.  Lower extremity swelling: Improved following restoration of sinus rhythm  and with patient resuming old Lasix prescription.  Given he has resumed furosemide, we will check a BMP.  Would ideally prefer he use loop diuretics sparingly with recommendation for continued wraps, leg elevation, and follow-up with vascular surgery.  We are obtaining an echo later today as outlined above.   Disposition: F/u with Dr. Quentin Ore  as previously scheduled.   Medication Adjustments/Labs and Tests Ordered: Current medicines are reviewed at length with the patient today.  Concerns regarding medicines are outlined above. Medication changes, Labs and Tests ordered today are summarized above and listed in the Patient Instructions accessible in Encounters.   Signed, Christell Faith, PA-C 03/04/2022 2:23 PM     [address] [place], [place] [address] [phone]

## 2022-03-04 NOTE — Telephone Encounter (Signed)
No answer.Voicemail is full. °

## 2022-03-04 NOTE — Patient Instructions (Signed)
Medication Instructions:  No changes at this time.   *If you need a refill on your cardiac medications before your next appointment, please call your pharmacy*   Lab Work: BMET today at the [employer] at [place] to 1st desk on the right to check in (REGISTRATION)  Lab hours: Monday- Friday (7:30 am- 5:30 pm)  If you have labs (blood work) drawn today and your tests are completely normal, you will receive your results only by: MyChart Message (if you have MyChart) OR A paper copy in the mail If you have any lab test that is abnormal or we need to change your treatment, we will call you to review the results.   Testing/Procedures: None   Follow-Up: At [facility], you and your health needs are our priority.  As part of our continuing mission to provide you with exceptional heart care, we have created designated Provider Care Teams.  These Care Teams include your primary Cardiologist (physician) and Advanced Practice Providers (APPs -  Physician Assistants and Nurse Practitioners) who all work together to provide you with the care you need, when you need it.  We recommend signing up for the patient portal called "MyChart".  Sign up information is provided on this After Visit Summary.  MyChart is used to connect with patients for Virtual Visits (Telemedicine).  Patients are able to view lab/test results, encounter notes, upcoming appointments, etc.  Non-urgent messages can be sent to your provider as well.   To learn more about what you can do with MyChart, go to [email or web address].    Your next appointment:   2 month(s)  The format for your next appointment:   In Person  Provider:   Lars Mage, MD      Important Information About Sugar

## 2022-03-06 DIAGNOSIS — I83013 Varicose veins of right lower extremity with ulcer of ankle: Secondary | ICD-10-CM | POA: Diagnosis not present

## 2022-03-06 DIAGNOSIS — I83022 Varicose veins of left lower extremity with ulcer of calf: Secondary | ICD-10-CM | POA: Diagnosis not present

## 2022-03-06 DIAGNOSIS — I83012 Varicose veins of right lower extremity with ulcer of calf: Secondary | ICD-10-CM | POA: Diagnosis not present

## 2022-03-06 DIAGNOSIS — I83023 Varicose veins of left lower extremity with ulcer of ankle: Secondary | ICD-10-CM | POA: Diagnosis not present

## 2022-03-08 NOTE — Telephone Encounter (Signed)
No answer/Voicemail box is full.  

## 2022-03-12 ENCOUNTER — Encounter: Payer: Self-pay | Admitting: *Deleted

## 2022-03-12 ENCOUNTER — Ambulatory Visit (INDEPENDENT_AMBULATORY_CARE_PROVIDER_SITE_OTHER): Payer: Medicare Other | Admitting: Internal Medicine

## 2022-03-12 ENCOUNTER — Encounter: Payer: Self-pay | Admitting: Internal Medicine

## 2022-03-12 VITALS — BP 122/72 | HR 82 | Temp 98.0°F | Ht 73.0 in | Wt 256.2 lb

## 2022-03-12 DIAGNOSIS — I2510 Atherosclerotic heart disease of native coronary artery without angina pectoris: Secondary | ICD-10-CM | POA: Diagnosis not present

## 2022-03-12 DIAGNOSIS — R5383 Other fatigue: Secondary | ICD-10-CM

## 2022-03-12 DIAGNOSIS — E786 Lipoprotein deficiency: Secondary | ICD-10-CM

## 2022-03-12 DIAGNOSIS — E559 Vitamin D deficiency, unspecified: Secondary | ICD-10-CM | POA: Diagnosis not present

## 2022-03-12 DIAGNOSIS — I2584 Coronary atherosclerosis due to calcified coronary lesion: Secondary | ICD-10-CM | POA: Diagnosis not present

## 2022-03-12 DIAGNOSIS — I4819 Other persistent atrial fibrillation: Secondary | ICD-10-CM

## 2022-03-12 DIAGNOSIS — J432 Centrilobular emphysema: Secondary | ICD-10-CM | POA: Diagnosis not present

## 2022-03-12 DIAGNOSIS — Z79899 Other long term (current) drug therapy: Secondary | ICD-10-CM | POA: Diagnosis not present

## 2022-03-12 DIAGNOSIS — E785 Hyperlipidemia, unspecified: Secondary | ICD-10-CM

## 2022-03-12 DIAGNOSIS — I878 Other specified disorders of veins: Secondary | ICD-10-CM | POA: Diagnosis not present

## 2022-03-12 DIAGNOSIS — R7301 Impaired fasting glucose: Secondary | ICD-10-CM

## 2022-03-12 MED ORDER — TRAMADOL HCL 50 MG PO TABS: 50.0000 mg | ORAL_TABLET | Freq: Every day | ORAL | 2 refills | Status: AC | PRN

## 2022-03-12 MED ORDER — ZOSTER VAC RECOMB ADJUVANTED 50 MCG/0.5ML IM SUSR: 0.5000 mL | Freq: Once | INTRAMUSCULAR | 1 refills | Status: AC

## 2022-03-12 MED ORDER — FUROSEMIDE 20 MG PO TABS: 20.0000 mg | ORAL_TABLET | Freq: Every day | ORAL | 3 refills | Status: DC

## 2022-03-12 NOTE — Assessment & Plan Note (Signed)
With ulcerations,  Managed by outside physician.  Records requested . Currently receiving wound care and weekly compression wraps by Dr Randel Books.

## 2022-03-12 NOTE — Assessment & Plan Note (Signed)
S/p cardioversion in early June.  Currently in sinus rhythm

## 2022-03-12 NOTE — Telephone Encounter (Signed)
Mailbox is full and unable to leave message. Will mail letter to patient with results and recommendations to call back if any further questions.

## 2022-03-12 NOTE — Progress Notes (Signed)
Subjective:  Patient ID: Seth Perez., male    DOB: Mar 09, 1941  Age: 81 y.o. MRN: [id number]  CC: The primary encounter diagnosis was Hyperlipidemia with low HDL. Diagnoses of Other fatigue, Impaired fasting glucose, Long-term use of high-risk medication, Centrilobular emphysema ([place]), Hypovitaminosis D, Lower extremity venous stasis, and Persistent atrial fibrillation ([place]) were also pertinent to this visit.   HPI [name] presents for  Chief Complaint  Patient presents with   Follow-up    3 month follow up for lymphedema afib COPD   1) Venous stasis ulcers /Lymphedema :  sees  Dr Jacqualine Code weekly for management of bilateral venous ulcers and management of lymphedema .  Saw a vascular surgeon but I do not have OV notes from either physician. Not pumping . Had an appt with vascular surgeon  Crystal Yi  of [place] in [place] but has not had follow up .  The legs were reportedly not unwrapped .  Per patient  a procedure is planned but NOT A PUMP .taking furosemide once daily   2) S/P CARDIOVERSION  in early June by Nikolai.  Now in sinus rhythm.  3) bilateral leg pain . Has not used tylenol max dose yet.  Wants refill of tramadol   4) COPD:  no recent exacerbations   Outpatient Medications Prior to Visit  Medication Sig Dispense Refill   ADVAIR DISKUS 250-50 MCG/ACT AEPB Inhale 1 puff into the lungs 2 (two) times daily.     albuterol (VENTOLIN HFA) 108 (90 Base) MCG/ACT inhaler Inhale 2 puffs into the lungs every 6 (six) hours as needed for wheezing. 6.7 g 11   apixaban (ELIQUIS) 5 MG TABS tablet Take 1 tablet (5 mg total) by mouth 2 (two) times daily. 60 tablet 0   Cobalamin Combinations (B-12) 1000-400 MCG SUBL Place 1,000 mcg under the tongue daily. 90 tablet 3   INCRUSE ELLIPTA 62.5 MCG/ACT AEPB TAKE 1 PUFF BY MOUTH EVERY DAY 30 each 2   metoprolol succinate (TOPROL-XL) 25 MG 24 hr tablet Take 1 tablet (25 mg total) by mouth in the morning and at bedtime.  180 tablet 3   Potassium 99 MG TABS Take 1 tablet by mouth daily.     simvastatin (ZOCOR) 20 MG tablet TAKE 1 TABLET BY MOUTH EVERYDAY AT BEDTIME 90 tablet 1   traMADol (ULTRAM) 50 MG tablet Take 1 tablet (50 mg total) by mouth every 6 (six) hours as needed for severe pain. 15 tablet 0   triamcinolone (KENALOG) 0.025 % ointment Apply 1 application  topically as needed.     VITAMIN D, CHOLECALCIFEROL, PO Take 5,000 Units by mouth daily.     No facility-administered medications prior to visit.    Review of Systems;  Patient denies headache, fevers, malaise, unintentional weight loss, skin rash, eye pain, sinus congestion and sinus pain, sore throat, dysphagia,  hemoptysis , cough, dyspnea, wheezing, chest pain, palpitations, orthopnea, edema, abdominal pain, nausea, melena, diarrhea, constipation, flank pain, dysuria, hematuria, urinary  Frequency, nocturia, numbness, tingling, seizures,  Focal weakness, Loss of consciousness,  Tremor, insomnia, depression, anxiety, and suicidal ideation.      Objective:  BP 122/72 (BP Location: Left Arm, Patient Position: Sitting, Cuff Size: Normal)   Pulse 82   Temp 98 F (36.7 C) (Oral)   Ht 6' 1"  (1.854 m)   Wt 256 lb 3.2 oz (116.2 kg)   SpO2 99%   BMI 33.80 kg/m   BP Readings from Last 3  Encounters:  03/12/22 122/72  03/04/22 118/76  02/22/22 109/64    Wt Readings from Last 3 Encounters:  03/12/22 256 lb 3.2 oz (116.2 kg)  03/04/22 254 lb (115.2 kg)  02/22/22 254 lb (115.2 kg)    General appearance: alert, cooperative and appears stated age Ears: normal TM's and external ear canals both ears Throat: lips, mucosa, and tongue normal; teeth and gums normal Neck: no adenopathy, no carotid bruit, supple, symmetrical, trachea midline and thyroid not enlarged, symmetric, no tenderness/mass/nodules Back: symmetric, no curvature. ROM normal. No CVA tenderness. Lungs: clear to auscultation bilaterally Heart: regular rate and rhythm, S1, S2  normal, no murmur, click, rub or gallop Abdomen: soft, non-tender; bowel sounds normal; no masses,  no organomegaly Pulses: 2+ and symmetric Skin: Skin color, texture, turgor normal. No rashes or lesions Lymph nodes: Cervical, supraclavicular, and axillary nodes normal. Ext: bilateral LYMPHEDEMA . Both legs wrapped in compression wraps   Lab Results  Component Value Date   HGBA1C 5.5 09/22/2017   HGBA1C 5.7 10/24/2012   HGBA1C 5.8 10/23/2012    Lab Results  Component Value Date   CREATININE 0.91 03/04/2022   CREATININE 0.86 10/24/2021   CREATININE 0.88 10/17/2021    Lab Results  Component Value Date   WBC 8.1 10/24/2021   HGB 13.7 10/24/2021   HCT 41.8 10/24/2021   PLT 181 10/24/2021   GLUCOSE 112 (H) 03/04/2022   CHOL 116 10/17/2021   TRIG 89.0 10/17/2021   HDL 35.40 (L) 10/17/2021   LDLCALC 63 10/17/2021   ALT 10 10/17/2021   AST 15 10/17/2021   NA 139 03/04/2022   K 4.3 03/04/2022   CL 104 03/04/2022   CREATININE 0.91 03/04/2022   BUN 15 03/04/2022   CO2 28 03/04/2022   TSH 2.95 10/17/2021   PSA 11.44 (H) 11/24/2012   INR 1.5 01/26/2014   HGBA1C 5.5 09/22/2017    ECHOCARDIOGRAM COMPLETE  Result Date: 03/04/2022    ECHOCARDIOGRAM REPORT   Patient Name:   Seth GILL Sr. Date of Exam: 03/04/2022 Medical Rec #:  [id number]             Height:       76.0 in Accession #:    [id number]            Weight:       254.0 lb Date of Birth:  07-Jan-1941              BSA:          2.452 m Patient Age:    21 years              BP:           130/82 mmHg Patient Gender: M                     HR:           92 bpm. Exam Location:  [facility] Procedure: 2D Echo, Cardiac Doppler and Color Doppler Indications:    I48.91* Unspeicified atrial fibrillation  History:        Patient has prior history of Echocardiogram examinations, most                 recent 11/23/2012. COPD, Arrythmias:Atrial Fibrillation,                 Signs/Symptoms:Edema; Risk Factors:Former Smoker and                  Dyslipidemia. H/o pulmonary  embolism.  Sonographer:    Pilar Jarvis RDMS, RVT, RDCS Referring Phys: [id number] Hilton [place] LAMBERT IMPRESSIONS  1. Left ventricular ejection fraction, by estimation, is 45 to 50%. The left ventricle has mildly decreased function. The left ventricle demonstrates global hypokinesis. Left ventricular diastolic parameters are indeterminate.  2. Right ventricular systolic function is normal. The right ventricular size is mildly enlarged.  3. Right atrial size was mildly dilated.  4. The mitral valve is normal in structure. No evidence of mitral valve regurgitation.  5. The aortic valve is tricuspid. Aortic valve regurgitation is not visualized.  6. Aortic dilatation noted. There is mild dilatation of the aortic root, measuring 41 mm. FINDINGS  Left Ventricle: Left ventricular ejection fraction, by estimation, is 45 to 50%. The left ventricle has mildly decreased function. The left ventricle demonstrates global hypokinesis. The left ventricular internal cavity size was normal in size. There is  no left ventricular hypertrophy. Left ventricular diastolic parameters are indeterminate. Right Ventricle: The right ventricular size is mildly enlarged. No increase in right ventricular wall thickness. Right ventricular systolic function is normal. Left Atrium: Left atrial size was normal in size. Right Atrium: Right atrial size was mildly dilated. Pericardium: There is no evidence of pericardial effusion. Mitral Valve: The mitral valve is normal in structure. No evidence of mitral valve regurgitation. Tricuspid Valve: The tricuspid valve is normal in structure. Tricuspid valve regurgitation is trivial. Aortic Valve: The aortic valve is tricuspid. Aortic valve regurgitation is not visualized. Aortic valve mean gradient measures 3.7 mmHg. Aortic valve peak gradient measures 7.4 mmHg. Aortic valve area, by VTI measures 3.11 cm. Pulmonic Valve: The pulmonic valve was not well visualized. Pulmonic valve  regurgitation is not visualized. Aorta: Aortic dilatation noted. There is mild dilatation of the aortic root, measuring 41 mm. Venous: The inferior vena cava was not well visualized. IAS/Shunts: No atrial level shunt detected by color flow Doppler.  LEFT VENTRICLE PLAX 2D LVIDd:         4.50 cm LVIDs:         3.40 cm LV PW:         1.20 cm LV IVS:        1.30 cm LVOT diam:     2.30 cm LV SV:         82 LV SV Index:   34 LVOT Area:     4.15 cm  LV Volumes (MOD) LV vol d, MOD A2C: 150.0 ml LV vol d, MOD A4C: 140.0 ml LV vol s, MOD A2C: 63.4 ml LV vol s, MOD A4C: 70.0 ml LV SV MOD A2C:     86.6 ml LV SV MOD A4C:     140.0 ml LV SV MOD BP:      77.1 ml RIGHT VENTRICLE RV Basal diam:  4.50 cm RV Mid diam:    3.70 cm RV S prime:     11.90 cm/s RVOT diam:      4.15 cm TAPSE (M-mode): 2.3 cm LEFT ATRIUM             Index        RIGHT ATRIUM           Index LA diam:        4.40 cm 1.79 cm/m   RA Area:     28.90 cm LA Vol (A2C):   66.8 ml 27.24 ml/m  RA Volume:   94.50 ml  38.54 ml/m LA Vol (A4C):   66.3 ml 27.04 ml/m LA Biplane Vol:  70.5 ml 28.75 ml/m  AORTIC VALVE                    PULMONIC VALVE AV Area (Vmax):    3.43 cm     PV Vmax:       0.94 m/s AV Area (Vmean):   3.07 cm     PV Peak grad:  3.5 mmHg AV Area (VTI):     3.11 cm AV Vmax:           136.33 cm/s AV Vmean:          92.067 cm/s AV VTI:            0.265 m AV Peak Grad:      7.4 mmHg AV Mean Grad:      3.7 mmHg LVOT Vmax:         112.63 cm/s LVOT Vmean:        68.033 cm/s LVOT VTI:          0.198 m LVOT/AV VTI ratio: 0.75  AORTA Ao Root diam: 4.10 cm Ao Asc diam:  3.80 cm Ao Arch diam: 2.7 cm  SHUNTS Systemic VTI:  0.20 m Systemic Diam: 2.30 cm Pulmonic Diam: 4.15 cm Kate Sable MD Electronically signed by Kate Sable MD Signature Date/Time: 03/04/2022/5:12:01 PM    Final     Assessment & Plan:   Problem List Items Addressed This Visit     COPD (chronic obstructive pulmonary disease) with emphysema (HCC)    Currently asymptomatic on  Incruse Ellipta and Advair.       Lower extremity venous stasis    With ulcerations,  Managed by outside physician.  Records requested . Currently receiving wound care and weekly compression wraps by Dr Randel Books.       Relevant Medications   furosemide (LASIX) 20 MG tablet   Hyperlipidemia with low HDL - Primary   Relevant Medications   furosemide (LASIX) 20 MG tablet   Other Relevant Orders   Lipid Profile   Direct LDL   Hypovitaminosis D   Relevant Orders   VITAMIN D 25 Hydroxy (Vit-D Deficiency, Fractures)   Atrial fibrillation (HCC)    S/p cardioversion in early June.  Currently in sinus rhythm      Relevant Medications   furosemide (LASIX) 20 MG tablet   Other Visit Diagnoses     Other fatigue       Relevant Orders   TSH   CBC with Differential/Platelet   Impaired fasting glucose       Relevant Orders   HgB A1c   Long-term use of high-risk medication       Relevant Orders   Comp Met (CMET)   TSH   CBC with Differential/Platelet      Follow-up: Return in about 3 months (around 06/12/2022).   [name], MD

## 2022-03-12 NOTE — Patient Instructions (Addendum)
I will refill your furosemide BUT DO NOT USE MORE THAN ONE DAILY BECAUSE YOU WILL HARM YOUR KIDNEYS  I WILL REFILL tramadol for use once  daily BUT YOU NEED TO USE TYLENOL 1000 MG TWICE DAILY AS YOUR FIRST LINE THERAPY BECAUSE TRAMADOL IS A CONTROLLED SUBSTANCE

## 2022-03-12 NOTE — Assessment & Plan Note (Addendum)
Currently asymptomatic on Incruse Ellipta and Advair.

## 2022-03-13 DIAGNOSIS — I83023 Varicose veins of left lower extremity with ulcer of ankle: Secondary | ICD-10-CM | POA: Diagnosis not present

## 2022-03-13 DIAGNOSIS — I83012 Varicose veins of right lower extremity with ulcer of calf: Secondary | ICD-10-CM | POA: Diagnosis not present

## 2022-03-13 DIAGNOSIS — I83013 Varicose veins of right lower extremity with ulcer of ankle: Secondary | ICD-10-CM | POA: Diagnosis not present

## 2022-03-13 DIAGNOSIS — I83022 Varicose veins of left lower extremity with ulcer of calf: Secondary | ICD-10-CM | POA: Diagnosis not present

## 2022-03-13 LAB — COMPREHENSIVE METABOLIC PANEL
ALT: 11 U/L (ref 0–53)
AST: 14 U/L (ref 0–37)
Albumin: 4 g/dL (ref 3.5–5.2)
Alkaline Phosphatase: 72 U/L (ref 39–117)
BUN: 10 mg/dL (ref 6–23)
CO2: 31 mEq/L (ref 19–32)
Calcium: 9.4 mg/dL (ref 8.4–10.5)
Chloride: 102 mEq/L (ref 96–112)
Creatinine, Ser: 0.9 mg/dL (ref 0.40–1.50)
GFR: 79.84 mL/min (ref >60.00)
Glucose, Bld: 110 mg/dL — ABNORMAL HIGH (ref 70–99)
Potassium: 4.3 mEq/L (ref 3.5–5.1)
Sodium: 140 mEq/L (ref 135–145)
Total Bilirubin: 0.6 mg/dL (ref 0.2–1.2)
Total Protein: 7.2 g/dL (ref 6.0–8.3)

## 2022-03-13 LAB — LIPID PANEL
Cholesterol: 127 mg/dL (ref 0–200)
HDL: 40.4 mg/dL (ref >39.00)
LDL Cholesterol: 73 mg/dL (ref 0–99)
NonHDL: 86.63
Total CHOL/HDL Ratio: 3
Triglycerides: 66 mg/dL (ref 0.0–149.0)
VLDL: 13.2 mg/dL (ref 0.0–40.0)

## 2022-03-13 LAB — LDL CHOLESTEROL, DIRECT: Direct LDL: 78 mg/dL

## 2022-03-13 LAB — CBC WITH DIFFERENTIAL/PLATELET
Basophils Absolute: 0.1 10*3/uL (ref 0.0–0.1)
Basophils Relative: 1.1 % (ref 0.0–3.0)
Eosinophils Absolute: 0.1 10*3/uL (ref 0.0–0.7)
Eosinophils Relative: 1.4 % (ref 0.0–5.0)
HCT: 41.9 % (ref 39.0–52.0)
Hemoglobin: 14.2 g/dL (ref 13.0–17.0)
Lymphocytes Relative: 28.9 % (ref 12.0–46.0)
Lymphs Abs: 1.9 10*3/uL (ref 0.7–4.0)
MCHC: 33.9 g/dL (ref 30.0–36.0)
MCV: 92.5 fl (ref 78.0–100.0)
Monocytes Absolute: 0.5 10*3/uL (ref 0.1–1.0)
Monocytes Relative: 8.1 % (ref 3.0–12.0)
Neutro Abs: 3.9 10*3/uL (ref 1.4–7.7)
Neutrophils Relative %: 60.5 % (ref 43.0–77.0)
Platelets: 227 10*3/uL (ref 150.0–400.0)
RBC: 4.53 Mil/uL (ref 4.22–5.81)
RDW: 13.3 % (ref 11.5–15.5)
WBC: 6.5 10*3/uL (ref 4.0–10.5)

## 2022-03-13 LAB — VITAMIN D 25 HYDROXY (VIT D DEFICIENCY, FRACTURES): VITD: 50.53 ng/mL (ref 30.00–100.00)

## 2022-03-13 LAB — TSH: TSH: 1.57 u[IU]/mL (ref 0.35–5.50)

## 2022-03-13 LAB — HEMOGLOBIN A1C: Hgb A1c MFr Bld: 5.7 % (ref 4.6–6.5)

## 2022-03-15 ENCOUNTER — Telehealth: Payer: Self-pay

## 2022-03-19 ENCOUNTER — Telehealth: Payer: Self-pay | Admitting: Internal Medicine

## 2022-03-20 DIAGNOSIS — I83022 Varicose veins of left lower extremity with ulcer of calf: Secondary | ICD-10-CM | POA: Diagnosis not present

## 2022-03-20 DIAGNOSIS — I83023 Varicose veins of left lower extremity with ulcer of ankle: Secondary | ICD-10-CM | POA: Diagnosis not present

## 2022-03-20 DIAGNOSIS — I83012 Varicose veins of right lower extremity with ulcer of calf: Secondary | ICD-10-CM | POA: Diagnosis not present

## 2022-03-20 DIAGNOSIS — I83013 Varicose veins of right lower extremity with ulcer of ankle: Secondary | ICD-10-CM | POA: Diagnosis not present

## 2022-04-01 DIAGNOSIS — I83013 Varicose veins of right lower extremity with ulcer of ankle: Secondary | ICD-10-CM | POA: Diagnosis not present

## 2022-04-01 DIAGNOSIS — I83023 Varicose veins of left lower extremity with ulcer of ankle: Secondary | ICD-10-CM | POA: Diagnosis not present

## 2022-04-01 DIAGNOSIS — I83022 Varicose veins of left lower extremity with ulcer of calf: Secondary | ICD-10-CM | POA: Diagnosis not present

## 2022-04-01 DIAGNOSIS — I83012 Varicose veins of right lower extremity with ulcer of calf: Secondary | ICD-10-CM | POA: Diagnosis not present

## 2022-04-09 ENCOUNTER — Ambulatory Visit (INDEPENDENT_AMBULATORY_CARE_PROVIDER_SITE_OTHER): Payer: Medicare Other | Admitting: Vascular Surgery

## 2022-04-09 ENCOUNTER — Encounter (INDEPENDENT_AMBULATORY_CARE_PROVIDER_SITE_OTHER): Payer: Self-pay | Admitting: Vascular Surgery

## 2022-04-09 VITALS — BP 135/89 | HR 86 | Resp 16 | Wt 254.0 lb

## 2022-04-09 DIAGNOSIS — I2510 Atherosclerotic heart disease of native coronary artery without angina pectoris: Secondary | ICD-10-CM

## 2022-04-09 DIAGNOSIS — Z95828 Presence of other vascular implants and grafts: Secondary | ICD-10-CM | POA: Diagnosis not present

## 2022-04-09 DIAGNOSIS — J432 Centrilobular emphysema: Secondary | ICD-10-CM

## 2022-04-09 DIAGNOSIS — I2584 Coronary atherosclerosis due to calcified coronary lesion: Secondary | ICD-10-CM

## 2022-04-09 DIAGNOSIS — I87009 Postthrombotic syndrome without complications of unspecified extremity: Secondary | ICD-10-CM | POA: Diagnosis not present

## 2022-04-09 DIAGNOSIS — E786 Lipoprotein deficiency: Secondary | ICD-10-CM

## 2022-04-09 DIAGNOSIS — I890 Lymphedema, not elsewhere classified: Secondary | ICD-10-CM | POA: Insufficient documentation

## 2022-04-09 DIAGNOSIS — E785 Hyperlipidemia, unspecified: Secondary | ICD-10-CM | POA: Diagnosis not present

## 2022-04-09 DIAGNOSIS — I2699 Other pulmonary embolism without acute cor pulmonale: Secondary | ICD-10-CM | POA: Diagnosis not present

## 2022-04-09 NOTE — Assessment & Plan Note (Signed)
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  This is a chronic and longstanding problem is going to take many months and Unna boots to help.  We have placed 3 layer Unna boots today bilaterally and these will be changed weekly.  Patient needs a lymphedema pump as adjuvant therapy in addition to his compression.  Elevation is much as possible.  Return to clinic in 4 to 6 weeks in follow-up to reevaluate.

## 2022-04-09 NOTE — Progress Notes (Signed)
Patient ID: Seth Perez., male   DOB: [birth year], 81 y.o.   MRN: [id number]  No chief complaint on file.   HPI Seth NOFZIGER Sr. is a 81 y.o. male.  I am asked to see the patient by Dr. Derrel Nip for evaluation of leg swelling and venous stasis changes. ***.     Past Medical History:  Diagnosis Date   Arthritis    Chicken pox    COPD (chronic obstructive pulmonary disease) ([place])    Pulmonary emboli (HCC)    on Xarelto    Past Surgical History:  Procedure Laterality Date   CARDIOVERSION N/A 02/22/2022   Procedure: CARDIOVERSION;  Surgeon: Wellington Hampshire, MD;  Location: [facility] ORS;  Service: Cardiovascular;  Laterality: N/A;   CHOLECYSTECTOMY  2008   TONSILLECTOMY AND ADENOIDECTOMY  1947     Family History  Problem Relation Age of Onset   Arthritis Mother    Lung cancer Brother        was a smoker   Breast cancer Maternal Grandmother    Heart disease Maternal Grandfather    Asthma Father    Emphysema Father    Heart disease Paternal Grandfather      Social History   Tobacco Use   Smoking status: Some Days    Packs/day: 1.00    Years: 59.00    Total pack years: 59.00    Types: Cigarettes    Last attempt to quit: 10/12/2012    Years since quitting: 9.4   Smokeless tobacco: Never  Vaping Use   Vaping Use: Never used  Substance Use Topics   Alcohol use: Not Currently    Comment: rare   Drug use: No     Allergies  Allergen Reactions   Adhesive [Tape] Rash    Current Outpatient Medications  Medication Sig Dispense Refill   ADVAIR DISKUS 250-50 MCG/ACT AEPB Inhale 1 puff into the lungs 2 (two) times daily.     albuterol (VENTOLIN HFA) 108 (90 Base) MCG/ACT inhaler Inhale 2 puffs into the lungs every 6 (six) hours as needed for wheezing. 6.7 g 11   apixaban (ELIQUIS) 5 MG TABS tablet Take 1 tablet (5 mg total) by mouth 2 (two) times daily. 60 tablet 0   Cobalamin Combinations (B-12) 1000-400 MCG SUBL Place 1,000 mcg under the tongue daily. 90  tablet 3   furosemide (LASIX) 20 MG tablet Take 1 tablet (20 mg total) by mouth daily. 30 tablet 3   INCRUSE ELLIPTA 62.5 MCG/ACT AEPB TAKE 1 PUFF BY MOUTH EVERY DAY 30 each 2   metoprolol succinate (TOPROL-XL) 25 MG 24 hr tablet Take 1 tablet (25 mg total) by mouth in the morning and at bedtime. 180 tablet 3   Potassium 99 MG TABS Take 1 tablet by mouth daily.     simvastatin (ZOCOR) 20 MG tablet TAKE 1 TABLET BY MOUTH EVERYDAY AT BEDTIME 90 tablet 1   traMADol (ULTRAM) 50 MG tablet Take 1 tablet (50 mg total) by mouth every 6 (six) hours as needed for severe pain. 15 tablet 0   traMADol (ULTRAM) 50 MG tablet Take 1 tablet (50 mg total) by mouth daily as needed for moderate pain. 30 tablet 2   VITAMIN D, CHOLECALCIFEROL, PO Take 5,000 Units by mouth daily.     triamcinolone (KENALOG) 0.025 % ointment Apply 1 application  topically as needed. (Patient not taking: Reported on 04/09/2022)     No current facility-administered medications for this visit.  Review of Systems: Negative Unless Checked Constitutional: [] Weight loss  [] Fever  [] Chills Cardiac: [] Chest pain   []  Atrial Fibrillation  [] Palpitations   [] Shortness of breath when laying flat   [] Shortness of breath with exertion. [] Shortness of breath at rest Vascular:  [] Pain in legs with walking   [] Pain in legs with standing [] Pain in legs when laying flat   [] Claudication    [] Pain in feet when laying flat    [x] History of DVT   [x] Phlebitis   [x] Swelling in legs   [] Varicose veins   [] Non-healing ulcers Pulmonary:   [] Uses home oxygen   [] Productive cough   [] Hemoptysis   [] Wheeze  [x] COPD   [] Asthma Neurologic:  [] Dizziness   [] Seizures  [] Blackouts [] History of stroke   [] History of TIA  [] Aphasia   [] Temporary Blindness   [] Weakness or numbness in arm   [] Weakness or numbness in leg Musculoskeletal:   [] Joint swelling   [] Joint pain   [] Low back pain  []  History of Knee Replacement [x] Arthritis [] back Surgeries  []  Spinal Stenosis     Hematologic:  [] Easy bruising  [] Easy bleeding   [] Hypercoagulable state   [] Anemic Gastrointestinal:  [] Diarrhea   [] Vomiting  [] Gastroesophageal reflux/heartburn   [] Difficulty swallowing. [] Abdominal pain Genitourinary:  [] Chronic kidney disease   [] Difficult urination  [] Anuric   [] Blood in urine [] Frequent urination  [] Burning with urination   [] Hematuria Skin:  [] Rashes   [] Ulcers [] Wounds Psychological:  [] History of anxiety   []  History of major depression  []  Memory Difficulties    Physical Exam BP 135/89 (BP Location: Right Arm)   Pulse 86   Resp 16   Wt 254 lb (115.2 kg)   BMI 33.51 kg/m  Gen:  WD/WN, NAD Head: [place]/AT, No temporalis wasting.  Ear/Nose/Throat: Hearing grossly intact, nares w/o erythema or drainage, oropharynx w/o Erythema/Exudate Eyes: Conjunctiva clear, sclera non-icteric  Neck: trachea midline.  No JVD.  Pulmonary:  Good air movement, respirations not labored, no use of accessory muscles  Cardiac: irregular Vascular:  Vessel Right Left  Radial Palpable Palpable                          DP NP NP  PT NP NP   Gastrointestinal:. No masses, surgical incisions, or scars. Musculoskeletal: M/S 5/5 throughout.  Extremities without ischemic changes.  No deformity or atrophy. Severe stasis dermatitis bilaterally with 2-3+ BLE edema. Neurologic: Sensation grossly intact in extremities.  Symmetrical.  Speech is fluent. Motor exam as listed above. Psychiatric: Judgment intact, Mood & affect appropriate for pt's clinical situation. Dermatologic: several superficial wounds on the left calf.    Radiology No results found.  Labs Recent Results (from the past 2160 hour(s))  Basic metabolic panel     Status: Abnormal   Collection Time: 03/04/22  9:58 AM  Result Value Ref Range   Sodium 139 135 - 145 mmol/L   Potassium 4.3 3.5 - 5.1 mmol/L    Comment: HEMOLYSIS AT THIS LEVEL MAY AFFECT RESULT   Chloride 104 98 - 111 mmol/L   CO2 28 22 - 32 mmol/L    Glucose, Bld 112 (H) 70 - 99 mg/dL    Comment: Glucose reference range applies only to samples taken after fasting for at least 8 hours.   BUN 15 8 - 23 mg/dL   Creatinine, Ser 0.91 0.61 - 1.24 mg/dL   Calcium 9.0 8.9 - 10.3 mg/dL   GFR, Estimated >60 >60 mL/min  Comment: (NOTE) Calculated using the CKD-EPI Creatinine Equation (2021)    Anion gap 7 5 - 15    Comment: Performed at [facility], [address] [place], [place] [address]  ECHOCARDIOGRAM COMPLETE     Status: None   Collection Time: 03/04/22 12:13 PM  Result Value Ref Range   Weight 4,064 oz   Height 75 in   BP 118/76 mmHg   AR max vel 3.43 cm2   AV Peak grad 7.4 mmHg   Ao pk vel 1.36 m/s   S' Lateral 3.40 cm   AV Area VTI 3.11 cm2   AV Mean grad 3.7 mmHg   Single Plane A4C EF 50.0 %   Single Plane A2C EF 57.7 %   Calc EF 52.8 %   AV Area mean vel 3.07 cm2  Comp Met (CMET)     Status: Abnormal   Collection Time: 03/12/22  3:22 PM  Result Value Ref Range   Sodium 140 135 - 145 mEq/L   Potassium 4.3 3.5 - 5.1 mEq/L   Chloride 102 96 - 112 mEq/L   CO2 31 19 - 32 mEq/L   Glucose, Bld 110 (H) 70 - 99 mg/dL   BUN 10 6 - 23 mg/dL   Creatinine, Ser 0.90 0.40 - 1.50 mg/dL   Total Bilirubin 0.6 0.2 - 1.2 mg/dL   Alkaline Phosphatase 72 39 - 117 U/L   AST 14 0 - 37 U/L   ALT 11 0 - 53 U/L   Total Protein 7.2 6.0 - 8.3 g/dL   Albumin 4.0 3.5 - 5.2 g/dL   GFR 79.84 >60.00 mL/min    Comment: Calculated using the CKD-EPI Creatinine Equation (2021)   Calcium 9.4 8.4 - 10.5 mg/dL  Lipid Profile     Status: None   Collection Time: 03/12/22  3:22 PM  Result Value Ref Range   Cholesterol 127 0 - 200 mg/dL    Comment: ATP III Classification       Desirable:  < 200 mg/dL               Borderline High:  200 - 239 mg/dL          High:  > = 240 mg/dL   Triglycerides 66.0 0.0 - 149.0 mg/dL    Comment: Normal:  <150 mg/dLBorderline High:  150 - 199 mg/dL   HDL 40.40 >39.00 mg/dL   VLDL 13.2 0.0 - 40.0 mg/dL   LDL  Cholesterol 73 0 - 99 mg/dL   Total CHOL/HDL Ratio 3     Comment:                Men          Women1/2 Average Risk     3.4          3.3Average Risk          5.0          4.42X Average Risk          9.6          7.13X Average Risk          15.0          11.0                       NonHDL 86.63     Comment: NOTE:  Non-HDL goal should be 30 mg/dL higher than patient's LDL goal (i.e. LDL goal of < 70 mg/dL, would have non-HDL  goal of < 100 mg/dL)  HgB A1c     Status: None   Collection Time: 03/12/22  3:22 PM  Result Value Ref Range   Hgb A1c MFr Bld 5.7 4.6 - 6.5 %    Comment: Glycemic Control Guidelines for People with Diabetes:Non Diabetic:  <6%Goal of Therapy: <7%Additional Action Suggested:  >8%   Direct LDL     Status: None   Collection Time: 03/12/22  3:22 PM  Result Value Ref Range   Direct LDL 78.0 mg/dL    Comment: Optimal:  <100 mg/dLNear or Above Optimal:  100-129 mg/dLBorderline High:  130-159 mg/dLHigh:  160-189 mg/dLVery High:  >190 mg/dL  TSH     Status: None   Collection Time: 03/12/22  3:22 PM  Result Value Ref Range   TSH 1.57 0.35 - 5.50 uIU/mL  CBC with Differential/Platelet     Status: None   Collection Time: 03/12/22  3:22 PM  Result Value Ref Range   WBC 6.5 4.0 - 10.5 K/uL   RBC 4.53 4.22 - 5.81 Mil/uL   Hemoglobin 14.2 13.0 - 17.0 g/dL   HCT 41.9 39.0 - 52.0 %   MCV 92.5 78.0 - 100.0 fl   MCHC 33.9 30.0 - 36.0 g/dL   RDW 13.3 11.5 - 15.5 %   Platelets 227.0 150.0 - 400.0 K/uL   Neutrophils Relative % 60.5 43.0 - 77.0 %   Lymphocytes Relative 28.9 12.0 - 46.0 %   Monocytes Relative 8.1 3.0 - 12.0 %   Eosinophils Relative 1.4 0.0 - 5.0 %   Basophils Relative 1.1 0.0 - 3.0 %   Neutro Abs 3.9 1.4 - 7.7 K/uL   Lymphs Abs 1.9 0.7 - 4.0 K/uL   Monocytes Absolute 0.5 0.1 - 1.0 K/uL   Eosinophils Absolute 0.1 0.0 - 0.7 K/uL   Basophils Absolute 0.1 0.0 - 0.1 K/uL  VITAMIN D 25 Hydroxy (Vit-D Deficiency, Fractures)     Status: None   Collection Time: 03/12/22  3:22  PM  Result Value Ref Range   VITD 50.53 30.00 - 100.00 ng/mL    Assessment/Plan: Hyperlipidemia with low HDL lipid control important in reducing the progression of atherosclerotic disease. Continue statin therapy     COPD (chronic obstructive pulmonary disease) with emphysema (HCC) Continue pulmonary medications and aerosols as already ordered, these medications have been reviewed and there are no changes at this time.  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  This is a chronic and longstanding problem is going to take many months and Unna boots to help.  We have placed 3 layer Unna boots today bilaterally and these will be changed weekly.  Patient needs a lymphedema pump as adjuvant therapy in addition to his compression.  Elevation is much as possible.  Return to clinic in 4 to 6 weeks in follow-up to reevaluate.  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  This is a chronic and longstanding problem is going to take many months and Unna boots to help.  We have placed 3 layer Unna boots today bilaterally and these will be changed weekly.  Patient needs a lymphedema pump as adjuvant therapy in addition to his compression.  Elevation is much as possible.  Return  to clinic in 4 to 6 weeks in follow-up to reevaluate.      Leotis Pain 04/09/2022, 5:08 PM   This note was created with Dragon medical transcription system.  Any errors from dictation are unintentional.

## 2022-04-17 ENCOUNTER — Ambulatory Visit (INDEPENDENT_AMBULATORY_CARE_PROVIDER_SITE_OTHER): Payer: Medicare Other | Admitting: Nurse Practitioner

## 2022-04-17 VITALS — BP 119/75 | HR 85 | Resp 16 | Ht 76.0 in | Wt 261.0 lb

## 2022-04-17 DIAGNOSIS — I890 Lymphedema, not elsewhere classified: Secondary | ICD-10-CM | POA: Diagnosis not present

## 2022-04-17 NOTE — Progress Notes (Signed)
History of Present Illness  There is no documented history at this time  Assessments & Plan   There are no diagnoses linked to this encounter.    Additional instructions  Subjective:  Patient presents with venous ulcer of the Bilateral lower extremity.    Procedure:  3 layer unna wrap was placed Bilateral lower extremity.   Plan:   Follow up in one week.  

## 2022-04-24 ENCOUNTER — Encounter (INDEPENDENT_AMBULATORY_CARE_PROVIDER_SITE_OTHER): Payer: Self-pay | Admitting: Nurse Practitioner

## 2022-04-24 ENCOUNTER — Telehealth: Payer: Self-pay | Admitting: *Deleted

## 2022-04-24 ENCOUNTER — Ambulatory Visit (INDEPENDENT_AMBULATORY_CARE_PROVIDER_SITE_OTHER): Payer: Medicare Other | Admitting: Nurse Practitioner

## 2022-04-24 ENCOUNTER — Telehealth: Payer: Self-pay | Admitting: Cardiology

## 2022-04-24 ENCOUNTER — Encounter: Payer: Self-pay | Admitting: *Deleted

## 2022-04-24 VITALS — BP 126/81 | HR 91 | Resp 16 | Wt 258.2 lb

## 2022-04-24 DIAGNOSIS — I890 Lymphedema, not elsewhere classified: Secondary | ICD-10-CM | POA: Diagnosis not present

## 2022-04-24 DIAGNOSIS — I5020 Unspecified systolic (congestive) heart failure: Secondary | ICD-10-CM

## 2022-04-24 NOTE — Telephone Encounter (Signed)
Went over results with the patients son at patients request.

## 2022-04-24 NOTE — Progress Notes (Signed)
History of Present Illness  There is no documented history at this time  Assessments & Plan   There are no diagnoses linked to this encounter.    Additional instructions  Subjective:  Patient presents with venous ulcer of the Bilateral lower extremity.    Procedure:  3 layer unna wrap was placed Bilateral lower extremity.   Plan:   Follow up in one week.  

## 2022-04-24 NOTE — Telephone Encounter (Signed)
The patient has been notified of the result and verbalized understanding.  All questions (if any) were answered. Darrell Jewel, RN 04/24/2022 11:03 AM   Referral placed.

## 2022-04-24 NOTE — Telephone Encounter (Signed)
Follow Up:    Patient's son called. He said his father had to talked to [place] earlier today. He can not hear in his right ear and admitted to his son, that he did not understand. Please call son and give the results and any other information that he needs to know.

## 2022-04-24 NOTE — Telephone Encounter (Signed)
-----   Message from Vickie Epley, MD sent at 03/15/2022  6:17 PM EDT ----- The echo shows that the pump function of the heart is abnormally low. We need to keep the heart rhythm stable to help improve the pump function.   Otila Kluver, can you refer him to a general cardiologist to assist with treatment of the reduced pump function.  Lysbeth Galas T. Quentin Ore, MD, [facility], [facility] Cardiac Electrophysiology

## 2022-04-26 ENCOUNTER — Telehealth: Payer: Self-pay | Admitting: Cardiology

## 2022-04-26 NOTE — Telephone Encounter (Signed)
Patient's significant other returned RN's call.  She requested she be called back as the patient is unable to hear well at this time.

## 2022-04-26 NOTE — Telephone Encounter (Signed)
Called and spoke with the patient and his wife and educated about EP and Gen Cards differences and why the referral was made. Educated about normal pumping function and his mildly decreased function. Educated that this referral is not urgent, we just need a general cardiologist to follow this in the future and make sure it continues to be stable because Dr. Quentin Ore specialized in the Afib fib the patient has and not the pumping function.  Verbalized understanding.

## 2022-04-26 NOTE — Telephone Encounter (Signed)
Already talked to son. See other encounter.

## 2022-04-26 NOTE — Telephone Encounter (Signed)
Spoke w/ Thayer Headings.  Reviewed results of recent ECHO w/ her and Dr. Brock Ra recommendation:  ----- Message from Vickie Epley, MD sent at 03/15/2022  6:17 PM EDT ----- The echo shows that the pump function of the heart is abnormally low. We need to keep the heart rhythm stable to help improve the pump function.    Otila Kluver, can you refer him to a general cardiologist to assist with treatment of the reduced pump function.   Lysbeth Galas T. Quentin Ore, MD, [facility], [facility] Cardiac Electrophysiology   Advised her that I am routing to scheduling, so she should expect a call from them to set this up.  She is unfamiliar w/ our other MDs, so does not have a preference as to which one he sees.  She is appreciative of the call.

## 2022-04-29 ENCOUNTER — Encounter (INDEPENDENT_AMBULATORY_CARE_PROVIDER_SITE_OTHER): Payer: Self-pay | Admitting: Nurse Practitioner

## 2022-05-01 ENCOUNTER — Encounter (INDEPENDENT_AMBULATORY_CARE_PROVIDER_SITE_OTHER): Payer: Self-pay

## 2022-05-01 ENCOUNTER — Ambulatory Visit (INDEPENDENT_AMBULATORY_CARE_PROVIDER_SITE_OTHER): Payer: Medicare Other | Admitting: Nurse Practitioner

## 2022-05-01 VITALS — BP 126/70 | HR 103 | Resp 16 | Wt 261.0 lb

## 2022-05-01 DIAGNOSIS — I890 Lymphedema, not elsewhere classified: Secondary | ICD-10-CM

## 2022-05-01 NOTE — Progress Notes (Signed)
History of Present Illness  There is no documented history at this time  Assessments & Plan   There are no diagnoses linked to this encounter.    Additional instructions  Subjective:  Patient presents with venous ulcer of the Bilateral lower extremity.    Procedure:  3 layer unna wrap was placed Bilateral lower extremity.   Plan:   Follow up in one week.  

## 2022-05-04 ENCOUNTER — Encounter (INDEPENDENT_AMBULATORY_CARE_PROVIDER_SITE_OTHER): Payer: Self-pay | Admitting: Nurse Practitioner

## 2022-05-06 ENCOUNTER — Ambulatory Visit (INDEPENDENT_AMBULATORY_CARE_PROVIDER_SITE_OTHER): Payer: Medicare Other | Admitting: Medical

## 2022-05-06 ENCOUNTER — Encounter: Payer: Self-pay | Admitting: Medical

## 2022-05-06 VITALS — BP 114/68 | HR 76 | Ht 76.0 in | Wt 263.2 lb

## 2022-05-06 DIAGNOSIS — I447 Left bundle-branch block, unspecified: Secondary | ICD-10-CM | POA: Diagnosis not present

## 2022-05-06 DIAGNOSIS — I429 Cardiomyopathy, unspecified: Secondary | ICD-10-CM | POA: Diagnosis not present

## 2022-05-06 DIAGNOSIS — I4819 Other persistent atrial fibrillation: Secondary | ICD-10-CM

## 2022-05-06 DIAGNOSIS — Z86718 Personal history of other venous thrombosis and embolism: Secondary | ICD-10-CM | POA: Diagnosis not present

## 2022-05-06 DIAGNOSIS — I5022 Chronic systolic (congestive) heart failure: Secondary | ICD-10-CM

## 2022-05-06 MED ORDER — DAPAGLIFLOZIN PROPANEDIOL 10 MG PO TABS: 10.0000 mg | ORAL_TABLET | Freq: Every day | ORAL | 5 refills | Status: DC

## 2022-05-06 NOTE — Progress Notes (Signed)
Cardiology Office Note:    Date:  05/06/2022   ID:  Seth Bales Sr., DOB 1941/02/29, MRN [id number]  PCP:  [name], MD  [facility] Cardiologist:  None  [facility] Electrophysiologist:  Vickie Epley, MD   Referring MD: [name], MD   Chief Complaint: 2 month follow-up  History of Present Illness:    Seth Veselka. is a 81 y.o. male with a hx of with history of coronary artery calcification, persistent A-fib status post DCCV on 02/22/2022 on Eliquis, HFpEF, recurrent PE status post IVC filter and on anticoagulation, COPD, chronic lower extremity swelling, and LBBB who presents for follow-up of DCCV.   He was diagnosed with new onset A-fib in the ED on 10/24/2021.  He was already on anticoagulation at that time given history of recurrent PE.  Metoprolol was titrated.  He established care with Dr. Quentin Ore in 01/2022, at which time he was without symptoms of angina or decompensation.  He remained in A-fib.  Echo was ordered.  He underwent successful DCCV with a 200 J shock on 02/22/2022.  He was seen in follow-up 03/04/22 and was doing well from a cardiac persepctive. He was taking lasix with improvement of LLE. He was in NSR.   Echo showed LVEF 45-50%, global HK, mildly dilated RA, mild Aortic root dilation  Today, the patient's son says the patient lost hearing in the left ear last week, he is trying to get into see ENT. The patient has chronic LLE/lymphedema. Overall swelling is better than a year ago, but still present. He is taking lasix '20mg'$  as needed. He doesn't take the lasix daily because they can make him dizzy. He is seeing Dr. Lucky Cowboy and he is doing the [facility] boots. No chest pain, SOB, orthopnea or pnd. Echo was discussed.   Past Medical History:  Diagnosis Date   Arthritis    Chicken pox    COPD (chronic obstructive pulmonary disease) ([place])    Pulmonary emboli ([place])    on Xarelto    Past Surgical History:  Procedure Laterality Date    CARDIOVERSION N/A 02/22/2022   Procedure: CARDIOVERSION;  Surgeon: Wellington Hampshire, MD;  Location: [facility] ORS;  Service: Cardiovascular;  Laterality: N/A;   CHOLECYSTECTOMY  2008   TONSILLECTOMY AND ADENOIDECTOMY  1947    Current Medications: Current Meds  Medication Sig   ADVAIR DISKUS 250-50 MCG/ACT AEPB Inhale 1 puff into the lungs 2 (two) times daily.   albuterol (VENTOLIN HFA) 108 (90 Base) MCG/ACT inhaler Inhale 2 puffs into the lungs every 6 (six) hours as needed for wheezing.   apixaban (ELIQUIS) 5 MG TABS tablet Take 1 tablet (5 mg total) by mouth 2 (two) times daily.   Cobalamin Combinations (B-12) 1000-400 MCG SUBL Place 1,000 mcg under the tongue daily.   dapagliflozin propanediol (FARXIGA) 10 MG TABS tablet Take 1 tablet (10 mg total) by mouth daily before breakfast.   furosemide (LASIX) 20 MG tablet Take 1 tablet (20 mg total) by mouth daily.   INCRUSE ELLIPTA 62.5 MCG/ACT AEPB TAKE 1 PUFF BY MOUTH EVERY DAY   metoprolol succinate (TOPROL-XL) 25 MG 24 hr tablet Take 1 tablet (25 mg total) by mouth in the morning and at bedtime.   Potassium 99 MG TABS Take 1 tablet by mouth daily.   simvastatin (ZOCOR) 20 MG tablet TAKE 1 TABLET BY MOUTH EVERYDAY AT BEDTIME   traMADol (ULTRAM) 50 MG tablet Take 1 tablet (50 mg total) by mouth every  6 (six) hours as needed for severe pain.   traMADol (ULTRAM) 50 MG tablet Take 1 tablet (50 mg total) by mouth daily as needed for moderate pain.   triamcinolone (KENALOG) 0.025 % ointment Apply 1 application  topically as needed.   VITAMIN D, CHOLECALCIFEROL, PO Take 5,000 Units by mouth daily.     Allergies:   Adhesive [tape]   Social History   Socioeconomic History   Marital status: Divorced    Spouse name: Not on file   Number of children: Not on file   Years of education: Not on file   Highest education level: Not on file  Occupational History   Not on file  Tobacco Use   Smoking status: Some Days    Packs/day: 1.00    Years: 59.00     Total pack years: 59.00    Types: Cigarettes    Last attempt to quit: 10/12/2012    Years since quitting: 9.5   Smokeless tobacco: Never  Vaping Use   Vaping Use: Never used  Substance and Sexual Activity   Alcohol use: Not Currently    Comment: rare   Drug use: No   Sexual activity: Not Currently  Other Topics Concern   Not on file  Social History Narrative   Not on file   Social Determinants of Health   Financial Resource Strain: Medium Risk (11/14/2021)   Overall Financial Resource Strain (CARDIA)    Difficulty of Paying Living Expenses: Somewhat hard  Food Insecurity: Unknown (09/19/2017)   Hunger Vital Sign    Worried About Running Out of Food in the Last Year: Patient refused    [place] in the Last Year: Patient refused  Transportation Needs: No Transportation Needs (08/22/2020)   PRAPARE - Hydrologist (Medical): No    Lack of Transportation (Non-Medical): No  Physical Activity: Unknown (08/22/2020)   Exercise Vital Sign    Days of Exercise per Week: 0 days    Minutes of Exercise per Session: Not on file  Stress: No Stress Concern Present (08/22/2020)   [place]    Feeling of Stress : Not at all  Social Connections: Unknown (08/22/2020)   Social Connection and Isolation Panel [NHANES]    Frequency of Communication with Friends and Family: Not on file    Frequency of Social Gatherings with Friends and Family: Not on file    Attends Religious Services: Not on file    Active Member of Clubs or Organizations: Not on file    Attends Archivist Meetings: Not on file    Marital Status: Married     Family History: The patient's family history includes Arthritis in his mother; Asthma in his father; Breast cancer in his maternal grandmother; Emphysema in his father; Heart disease in his maternal grandfather and paternal grandfather; Lung cancer in his  brother.  ROS:   Please see the history of present illness.     All other systems reviewed and are negative.  EKGs/Labs/Other Studies Reviewed:    The following studies were reviewed today:  Echo 02/2022  1. Left ventricular ejection fraction, by estimation, is 45 to 50%. The  left ventricle has mildly decreased function. The left ventricle  demonstrates global hypokinesis. Left ventricular diastolic parameters are  indeterminate.   2. Right ventricular systolic function is normal. The right ventricular  size is mildly enlarged.   3. Right atrial size was mildly dilated.  4. The mitral valve is normal in structure. No evidence of mitral valve  regurgitation.   5. The aortic valve is tricuspid. Aortic valve regurgitation is not  visualized.   6. Aortic dilatation noted. There is mild dilatation of the aortic root,  measuring 41 mm.   EKG:  EKG is ordered today.  The ekg ordered today demonstrates Afib, 76bpm, LBBB, TWI aVL  Recent Labs: 10/24/2021: B Natriuretic Peptide 98.5 03/12/2022: ALT 11; BUN 10; Creatinine, Ser 0.90; Hemoglobin 14.2; Platelets 227.0; Potassium 4.3; Sodium 140; TSH 1.57  Recent Lipid Panel    Component Value Date/Time   CHOL 127 03/12/2022 1522   CHOL 151 11/19/2012 0147   TRIG 66.0 03/12/2022 1522   TRIG 71 11/19/2012 0147   HDL 40.40 03/12/2022 1522   HDL 26 (L) 11/19/2012 0147   CHOLHDL 3 03/12/2022 1522   VLDL 13.2 03/12/2022 1522   VLDL 14 11/19/2012 0147   LDLCALC 73 03/12/2022 1522   LDLCALC 111 (H) 11/19/2012 0147   LDLDIRECT 78.0 03/12/2022 1522    Physical Exam:    VS:  BP 114/68 (BP Location: Left Arm, Patient Position: Sitting, Cuff Size: Normal)   Pulse 76   Ht '6\' 4"'$  (1.93 m)   Wt 263 lb 3.2 oz (119.4 kg)   SpO2 96%   BMI 32.04 kg/m     Wt Readings from Last 3 Encounters:  05/06/22 263 lb 3.2 oz (119.4 kg)  05/01/22 261 lb (118.4 kg)  04/24/22 258 lb 3.2 oz (117.1 kg)     GEN:  Well nourished, well developed in no acute  distress HEENT: Normal NECK: No JVD; No carotid bruits LYMPHATICS: No lymphadenopathy CARDIAC: Irreg Irreg, no murmurs, rubs, gallops RESPIRATORY:  Clear to auscultation without rales, wheezing or rhonchi  ABDOMEN: Soft, non-tender, non-distended MUSCULOSKELETAL:  1-2+ lower edema; No deformity  SKIN: Warm and dry NEUROLOGIC:  Alert and oriented x 3 PSYCHIATRIC:  Normal affect   ASSESSMENT:    1. Persistent atrial fibrillation (HCC)   2. Cardiomyopathy, unspecified type ([place])   3. Chronic systolic heart failure ([place])   4. Left bundle branch block   5. History of DVT (deep vein thrombosis)    PLAN:    In order of problems listed above:  Persistent Afib EKG shows rate controlled Afib. Continue Toprol '25mg'$  daily for rate control. Continue Eliquis '5mg'$  BID for stroke ppx.   HFmrEF Echo showed LVEF 45-50%. He has chronic LLE on exam with UNNA boots, also with lymphedema/venous stasis. He takes lasix '20mg'$  as needed. No h/o CAD or MI. I will order a Myoview Lexsican to evaluate for ischemia. I will start [place] '10mg'$  daily, BMET in a week. I asked him to monitor for dizziness/lightheadedness. We will continue GDMT as able at follow-up.  LBBB Stable with no syncope.   H/o DVT He is on chronic Eliquis.   Disposition: Follow up in 1 month(s) with MD/APP   Signed, Graclyn Lawther Ninfa Meeker, PA-C  05/06/2022 12:46 PM    [facility]

## 2022-05-06 NOTE — Patient Instructions (Signed)
Medication Instructions:  Your physician has recommended you make the following change in your medication:   START Farxiga 10 mg daily. An Rx has been sent to your pharmacy.  *If you need a refill on your cardiac medications before your next appointment, please call your pharmacy*   Lab Work: Your physician recommends that you return for lab work (Bmp) in: 1 week  Please have your lab drawn at the  [facility]. No appt needed. Lab hours are Mon-Fri 7am-6pm.   If you have labs (blood work) drawn today and your tests are completely normal, you will receive your results only by: [place] (if you have MyChart) OR A paper copy in the mail If you have any lab test that is abnormal or we need to change your treatment, we will call you to review the results.   Testing/Procedures: Your physician has requested that you have a lexiscan myoview. For further information please visit [email or web address]. Please follow instruction sheet, as given.    Follow-Up: At [facility], you and your health needs are our priority.  As part of our continuing mission to provide you with exceptional heart care, we have created designated Provider Care Teams.  These Care Teams include your primary Cardiologist (physician) and Advanced Practice Providers (APPs -  Physician Assistants and Nurse Practitioners) who all work together to provide you with the care you need, when you need it.  We recommend signing up for the patient portal called "MyChart".  Sign up information is provided on this After Visit Summary.  MyChart is used to connect with patients for Virtual Visits (Telemedicine).  Patients are able to view lab/test results, encounter notes, upcoming appointments, etc.  Non-urgent messages can be sent to your provider as well.   To learn more about what you can do with MyChart, go to [email or web address].    Your next appointment:   4 week(s)  The format for your next appointment:   In  Person  Provider:   You may see Lars Mage, MD or one of the following Advanced Practice Providers on your designated Care Team:   Murray Hodgkins, NP Christell Faith, PA-C Cadence Kathlen Mody, [place]   Other Instructions [place]  Your caregiver has ordered a Stress Test with nuclear imaging. The purpose of this test is to evaluate the blood supply to your heart muscle. This procedure is referred to as a "Non-Invasive Stress Test." This is because other than having an IV started in your vein, nothing is inserted or "invades" your body. Cardiac stress tests are done to find areas of poor blood flow to the heart by determining the extent of coronary artery disease (CAD). Some patients exercise on a treadmill, which naturally increases the blood flow to your heart, while others who are  unable to walk on a treadmill due to physical limitations have a pharmacologic/chemical stress agent called Lexiscan . This medicine will mimic walking on a treadmill by temporarily increasing your coronary blood flow.   Please note: these test may take anywhere between 2-4 hours to complete  PLEASE REPORT TO [place] AT THE FIRST DESK WILL DIRECT YOU WHERE TO GO  Date of Procedure:_____________________________________  Arrival Time for Procedure:______________________________  PLEASE NOTIFY THE OFFICE AT LEAST 24 HOURS IN ADVANCE IF YOU ARE UNABLE TO KEEP YOUR APPOINTMENT.  [phone] AND  PLEASE NOTIFY NUCLEAR MEDICINE AT [facility] AT LEAST 24 HOURS IN ADVANCE IF YOU ARE UNABLE TO KEEP YOUR APPOINTMENT. [phone]  How  to prepare for your Myoview test:  Do not eat or drink after midnight No caffeine for 24 hours prior to test No smoking 24 hours prior to test. Your medication may be taken with water.  If your doctor stopped a medication because of this test, do not take that medication. Please wear a short sleeve shirt. No cologne or lotion. Wear comfortable walking  shoes.      Important Information About Sugar

## 2022-05-08 ENCOUNTER — Encounter (INDEPENDENT_AMBULATORY_CARE_PROVIDER_SITE_OTHER): Payer: Self-pay

## 2022-05-08 ENCOUNTER — Other Ambulatory Visit
Admission: RE | Admit: 2022-05-08 | Discharge: 2022-05-08 | Disposition: A | Discharge status: Home / Self Care | Payer: Medicare Other | Attending: Medical | Admitting: Medical

## 2022-05-08 ENCOUNTER — Ambulatory Visit (INDEPENDENT_AMBULATORY_CARE_PROVIDER_SITE_OTHER): Payer: Medicare Other | Admitting: Nurse Practitioner

## 2022-05-08 VITALS — BP 135/76 | HR 57 | Resp 16 | Wt 265.0 lb

## 2022-05-08 DIAGNOSIS — I890 Lymphedema, not elsewhere classified: Secondary | ICD-10-CM | POA: Diagnosis not present

## 2022-05-08 DIAGNOSIS — I429 Cardiomyopathy, unspecified: Secondary | ICD-10-CM | POA: Diagnosis not present

## 2022-05-08 LAB — BASIC METABOLIC PANEL
Anion gap: 7 (ref 5–15)
BUN: 13 mg/dL (ref 8–23)
CO2: 26 mmol/L (ref 22–32)
Calcium: 9 mg/dL (ref 8.9–10.3)
Chloride: 107 mmol/L (ref 98–111)
Creatinine, Ser: 0.85 mg/dL (ref 0.61–1.24)
GFR, Estimated: >60 mL/min (ref >60)
Glucose, Bld: 113 mg/dL — ABNORMAL HIGH (ref 70–99)
Potassium: 3.9 mmol/L (ref 3.5–5.1)
Sodium: 140 mmol/L (ref 135–145)

## 2022-05-08 NOTE — Progress Notes (Signed)
History of Present Illness  There is no documented history at this time  Assessments & Plan   There are no diagnoses linked to this encounter.    Additional instructions  Subjective:  Patient presents with venous ulcer of the Bilateral lower extremity.    Procedure:  3 layer unna wrap was placed Bilateral lower extremity.   Plan:   Follow up in one week.  

## 2022-05-13 ENCOUNTER — Ambulatory Visit: Payer: Medicare Other

## 2022-05-16 ENCOUNTER — Ambulatory Visit (INDEPENDENT_AMBULATORY_CARE_PROVIDER_SITE_OTHER): Payer: Medicare Other | Admitting: Nurse Practitioner

## 2022-05-16 ENCOUNTER — Encounter (INDEPENDENT_AMBULATORY_CARE_PROVIDER_SITE_OTHER): Payer: Self-pay | Admitting: Nurse Practitioner

## 2022-05-16 VITALS — BP 129/87 | HR 109 | Resp 16 | Wt 263.6 lb

## 2022-05-16 DIAGNOSIS — E786 Lipoprotein deficiency: Secondary | ICD-10-CM

## 2022-05-16 DIAGNOSIS — E785 Hyperlipidemia, unspecified: Secondary | ICD-10-CM | POA: Diagnosis not present

## 2022-05-16 DIAGNOSIS — I890 Lymphedema, not elsewhere classified: Secondary | ICD-10-CM

## 2022-05-21 ENCOUNTER — Ambulatory Visit
Admission: RE | Admit: 2022-05-21 | Discharge: 2022-05-21 | Disposition: A | Discharge status: Home / Self Care | Payer: Medicare Other | Source: Ambulatory Visit | Attending: Medical | Admitting: Medical

## 2022-05-21 DIAGNOSIS — I429 Cardiomyopathy, unspecified: Secondary | ICD-10-CM | POA: Diagnosis not present

## 2022-05-21 LAB — NM MYOCAR MULTI W/SPECT W/WALL MOTION / EF
LV dias vol: 137 mL (ref 62–150)
LV sys vol: 58 mL
Nuc Stress EF: 58 %
Peak HR: 85 {beats}/min
Percent HR: 61 %
Rest HR: 72 {beats}/min
Rest Nuclear Isotope Dose: 9.8 mCi
SDS: 3
SRS: 3
SSS: 5
ST Depression (mm): 0 mm
Stress Nuclear Isotope Dose: 30.9 mCi
TID: 1.05

## 2022-05-21 MED ORDER — TECHNETIUM TC 99M TETROFOSMIN IV KIT
10.0000 | PACK | Freq: Once | INTRAVENOUS | Status: AC
  Administered 2022-05-21: 9.79 via INTRAVENOUS

## 2022-05-21 MED ORDER — REGADENOSON 0.4 MG/5ML IV SOLN
0.4000 mg | Freq: Once | INTRAVENOUS | Status: AC
  Administered 2022-05-21: 0.4 mg via INTRAVENOUS
  Filled 2022-05-21: qty 5

## 2022-05-21 MED ORDER — TECHNETIUM TC 99M TETROFOSMIN IV KIT
30.9400 | PACK | Freq: Once | INTRAVENOUS | Status: AC | PRN
  Administered 2022-05-21: 30.94 via INTRAVENOUS

## 2022-05-22 ENCOUNTER — Ambulatory Visit (INDEPENDENT_AMBULATORY_CARE_PROVIDER_SITE_OTHER): Payer: Medicare Other | Admitting: Nurse Practitioner

## 2022-05-22 ENCOUNTER — Encounter (INDEPENDENT_AMBULATORY_CARE_PROVIDER_SITE_OTHER): Payer: Self-pay | Admitting: Nurse Practitioner

## 2022-05-22 VITALS — BP 111/71 | HR 85 | Resp 16 | Wt 261.8 lb

## 2022-05-22 DIAGNOSIS — I890 Lymphedema, not elsewhere classified: Secondary | ICD-10-CM | POA: Diagnosis not present

## 2022-05-22 NOTE — Progress Notes (Signed)
History of Present Illness  There is no documented history at this time  Assessments & Plan   There are no diagnoses linked to this encounter.    Additional instructions  Subjective:  Patient presents with venous ulcer of the Bilateral lower extremity.    Procedure:  3 layer unna wrap was placed Bilateral lower extremity.   Plan:   Follow up in one week.  

## 2022-05-24 ENCOUNTER — Telehealth: Payer: Self-pay

## 2022-05-24 NOTE — Telephone Encounter (Signed)
-----   Message from [place], PA-C sent at 05/23/2022 12:50 PM EDT ----- Leane Call showed normal stress test without ischemia or scar, normal pump function and no significant coronary artery calcification, overall low risk

## 2022-05-24 NOTE — Telephone Encounter (Signed)
Called to give the patient stress test results. Unable to lmom. Pt voicemail is full.

## 2022-05-28 ENCOUNTER — Ambulatory Visit: Payer: Medicare Other | Admitting: Internal Medicine

## 2022-05-28 NOTE — Progress Notes (Unsigned)
Electrophysiology Office Follow up Visit Note:    Date:  05/29/2022   ID:  Seth Bales Sr., DOB Dec 23, 1940, MRN [id number]  PCP:  [name], MD  [facility] Cardiologist:  None  [facility] Electrophysiologist:  Vickie Epley, MD    Interval History:    Seth Bales Sr. is a 81 y.o. male who presents for a follow up visit.  I last saw the patient in clinic Feb 13, 2022 for his atrial fibrillation.  His atrial fibrillation was diagnosed in February.  At our last appointment he reported chronic lower extremity edema which was his biggest complaint.  He has an IVC filter and is on Eliquis.  Given symptoms of diastolic heart failure, I recommended cardioversion.  An echocardiogram was ordered.  He underwent a successful cardioversion on February 22, 2022.  He felt better at his follow-up appointment with Christell Faith on March 04, 2022.  His swelling had improved.  Echocardiogram performed on March 04, 2022 showed a moderately reduced left ventricular ejection fraction, 45%.  His RV function was normal.  He had no significant valvular abnormalities.  He then underwent a SPECT stress test on May 21, 2022 which did not show any evidence of coronary artery blockages or narrowings.  He then saw Cadence in clinic on August 14 and was back in rate controlled atrial fibrillation.  He tells me that he felt better after cardioversion with improved swelling in his legs.    Past Medical History:  Diagnosis Date   Arthritis    Chicken pox    COPD (chronic obstructive pulmonary disease) ([place])    Pulmonary emboli ([place])    on Xarelto    Past Surgical History:  Procedure Laterality Date   CARDIOVERSION N/A 02/22/2022   Procedure: CARDIOVERSION;  Surgeon: Wellington Hampshire, MD;  Location: [facility] ORS;  Service: Cardiovascular;  Laterality: N/A;   CHOLECYSTECTOMY  2008   TONSILLECTOMY AND ADENOIDECTOMY  1947    Current Medications: Current Meds  Medication Sig   ADVAIR DISKUS 250-50  MCG/ACT AEPB Inhale 1 puff into the lungs 2 (two) times daily.   albuterol (VENTOLIN HFA) 108 (90 Base) MCG/ACT inhaler Inhale 2 puffs into the lungs every 6 (six) hours as needed for wheezing.   amiodarone (PACERONE) 200 MG tablet Take 2 tablets (400 mg total) by mouth 2 (two) times daily for 5 days, THEN 2 tablets (400 mg total) daily for 5 days, THEN 1 tablet (200 mg total) daily.   apixaban (ELIQUIS) 5 MG TABS tablet Take 1 tablet (5 mg total) by mouth 2 (two) times daily.   Cobalamin Combinations (B-12) 1000-400 MCG SUBL Place 1,000 mcg under the tongue daily.   dapagliflozin propanediol (FARXIGA) 10 MG TABS tablet Take 1 tablet (10 mg total) by mouth daily before breakfast.   furosemide (LASIX) 20 MG tablet Take 1 tablet (20 mg total) by mouth daily.   INCRUSE ELLIPTA 62.5 MCG/ACT AEPB TAKE 1 PUFF BY MOUTH EVERY DAY   metoprolol succinate (TOPROL-XL) 25 MG 24 hr tablet Take 1 tablet (25 mg total) by mouth in the morning and at bedtime.   Potassium 99 MG TABS Take 1 tablet by mouth daily.   simvastatin (ZOCOR) 20 MG tablet TAKE 1 TABLET BY MOUTH EVERYDAY AT BEDTIME   traMADol (ULTRAM) 50 MG tablet Take 1 tablet (50 mg total) by mouth every 6 (six) hours as needed for severe pain.   traMADol (ULTRAM) 50 MG tablet Take 1 tablet (50 mg total) by  mouth daily as needed for moderate pain.   triamcinolone (KENALOG) 0.025 % ointment Apply 1 application  topically as needed.   VITAMIN D, CHOLECALCIFEROL, PO Take 5,000 Units by mouth daily.     Allergies:   Adhesive [tape]   Social History   Socioeconomic History   Marital status: Divorced    Spouse name: Not on file   Number of children: Not on file   Years of education: Not on file   Highest education level: Not on file  Occupational History   Not on file  Tobacco Use   Smoking status: Some Days    Packs/day: 1.00    Years: 59.00    Total pack years: 59.00    Types: Cigarettes    Last attempt to quit: 10/12/2012    Years since  quitting: 9.6   Smokeless tobacco: Never  Vaping Use   Vaping Use: Never used  Substance and Sexual Activity   Alcohol use: Not Currently    Comment: rare   Drug use: No   Sexual activity: Not Currently  Other Topics Concern   Not on file  Social History Narrative   Not on file   Social Determinants of Health   Financial Resource Strain: Medium Risk (11/14/2021)   Overall Financial Resource Strain (CARDIA)    Difficulty of Paying Living Expenses: Somewhat hard  Food Insecurity: Unknown (09/19/2017)   Hunger Vital Sign    Worried About Running Out of Food in the Last Year: Patient refused    [place] in the Last Year: Patient refused  Transportation Needs: No Transportation Needs (08/22/2020)   PRAPARE - Hydrologist (Medical): No    Lack of Transportation (Non-Medical): No  Physical Activity: Unknown (08/22/2020)   Exercise Vital Sign    Days of Exercise per Week: 0 days    Minutes of Exercise per Session: Not on file  Stress: No Stress Concern Present (08/22/2020)   [place]    Feeling of Stress : Not at all  Social Connections: Unknown (08/22/2020)   Social Connection and Isolation Panel [NHANES]    Frequency of Communication with Friends and Family: Not on file    Frequency of Social Gatherings with Friends and Family: Not on file    Attends Religious Services: Not on file    Active Member of Clubs or Organizations: Not on file    Attends Archivist Meetings: Not on file    Marital Status: Married     Family History: The patient's family history includes Arthritis in his mother; Asthma in his father; Breast cancer in his maternal grandmother; Emphysema in his father; Heart disease in his maternal grandfather and paternal grandfather; Lung cancer in his brother.  ROS:   Please see the history of present illness.    All other systems reviewed and are  negative.  EKGs/Labs/Other Studies Reviewed:    The following studies were reviewed today:  May 21, 2022 SPECT Low risk study, no evidence of significant ischemia or scar  March 04, 2022 echo EF 45 to 50% RV function normal Mildly dilated right atrium No MR      Recent Labs: 10/24/2021: B Natriuretic Peptide 98.5 03/12/2022: ALT 11; Hemoglobin 14.2; Platelets 227.0; TSH 1.57 05/08/2022: BUN 13; Creatinine, Ser 0.85; Potassium 3.9; Sodium 140  Recent Lipid Panel    Component Value Date/Time   CHOL 127 03/12/2022 1522   CHOL 151 11/19/2012 0147  TRIG 66.0 03/12/2022 1522   TRIG 71 11/19/2012 0147   HDL 40.40 03/12/2022 1522   HDL 26 (L) 11/19/2012 0147   CHOLHDL 3 03/12/2022 1522   VLDL 13.2 03/12/2022 1522   VLDL 14 11/19/2012 0147   LDLCALC 73 03/12/2022 1522   LDLCALC 111 (H) 11/19/2012 0147   LDLDIRECT 78.0 03/12/2022 1522    Physical Exam:    VS:  BP 130/80   Pulse 96   Ht '6\' 4"'$  (1.93 m)   Wt 261 lb 6.4 oz (118.6 kg)   SpO2 96%   BMI 31.82 kg/m     Wt Readings from Last 3 Encounters:  05/29/22 261 lb 6.4 oz (118.6 kg)  05/22/22 261 lb 12.8 oz (118.8 kg)  05/16/22 263 lb 9.6 oz (119.6 kg)     GEN: Elderly in no acute distress HEENT: Normal, hard of hearing NECK: No JVD; No carotid bruits LYMPHATICS: No lymphadenopathy CARDIAC: Irregularly irregular, no murmurs, rubs, gallops RESPIRATORY:  Clear to auscultation without rales, wheezing or rhonchi  ABDOMEN: Soft, non-tender, non-distended MUSCULOSKELETAL: 2-3+ pitting bilateral lower extremity edema and wraps ; No deformity  SKIN: Warm and dry.  No visible skin breakdown although he has a history of lower extremity wounds. NEUROLOGIC:  Alert and oriented x 3 PSYCHIATRIC:  Normal affect        ASSESSMENT:    1. Persistent atrial fibrillation ([place])   2. Chronic systolic heart failure ([place])   3. Left bundle branch block   4. History of DVT (deep vein thrombosis)    PLAN:    In order of  problems listed above:  #Persistent atrial fibrillation Back in atrial fibrillation after cardioversion several months ago.  On Eliquis for stroke prophylaxis. Given diagnosis of chronic systolic heart failure rhythm control is indicated.  His QTc is slightly long which precludes safe use of dofetilide and his history of heart failure prevents safe use of class Ic agents.  I have recommended amiodarone.  Start amiodarone 400 mg by mouth twice daily for 5 days followed by 400 mg by mouth once daily for 5 days followed by 200 mg by mouth daily thereafter.  Transaminases were assessed in June of this year and were within normal limits.  TSH checked in June of this year was also within normal limits.  Plan for cardioversion in 4 weeks.  Cardioversion has been discussed with the patient in detail.  #Chronic systolic heart failure NYHA class II-III.  Rhythm control indicated as above.  Continue metoprolol, Lasix.  Follow-up 3 months.   Total time of encounter: 46 minutes total time of encounter, including face-to-face patient care, coordination of care and counseling regarding high complexity medical decision making re: AF.   Medication Adjustments/Labs and Tests Ordered: Current medicines are reviewed at length with the patient today.  Concerns regarding medicines are outlined above.  No orders of the defined types were placed in this encounter.  Meds ordered this encounter  Medications   amiodarone (PACERONE) 200 MG tablet    Sig: Take 2 tablets (400 mg total) by mouth 2 (two) times daily for 5 days, THEN 2 tablets (400 mg total) daily for 5 days, THEN 1 tablet (200 mg total) daily.    Dispense:  90 tablet    Refill:  3     Signed, Lars Mage, MD, [facility], [facility] 05/29/2022 12:38 PM    Electrophysiology  [facility]

## 2022-05-29 ENCOUNTER — Ambulatory Visit: Payer: Medicare Other | Attending: Cardiology | Admitting: Cardiology

## 2022-05-29 ENCOUNTER — Ambulatory Visit (INDEPENDENT_AMBULATORY_CARE_PROVIDER_SITE_OTHER): Payer: Medicare Other | Admitting: Nurse Practitioner

## 2022-05-29 ENCOUNTER — Encounter: Payer: Self-pay | Admitting: Cardiology

## 2022-05-29 ENCOUNTER — Encounter (INDEPENDENT_AMBULATORY_CARE_PROVIDER_SITE_OTHER): Payer: Self-pay | Admitting: Nurse Practitioner

## 2022-05-29 VITALS — BP 130/80 | HR 96 | Ht 76.0 in | Wt 261.4 lb

## 2022-05-29 VITALS — BP 107/72 | HR 84 | Resp 16 | Wt 261.0 lb

## 2022-05-29 DIAGNOSIS — I447 Left bundle-branch block, unspecified: Secondary | ICD-10-CM | POA: Insufficient documentation

## 2022-05-29 DIAGNOSIS — I890 Lymphedema, not elsewhere classified: Secondary | ICD-10-CM

## 2022-05-29 DIAGNOSIS — Z86718 Personal history of other venous thrombosis and embolism: Secondary | ICD-10-CM | POA: Diagnosis not present

## 2022-05-29 DIAGNOSIS — I4819 Other persistent atrial fibrillation: Secondary | ICD-10-CM | POA: Diagnosis not present

## 2022-05-29 DIAGNOSIS — I5022 Chronic systolic (congestive) heart failure: Secondary | ICD-10-CM | POA: Diagnosis not present

## 2022-05-29 MED ORDER — AMIODARONE HCL 200 MG PO TABS: ORAL_TABLET | ORAL | 3 refills | Status: DC

## 2022-05-29 NOTE — Patient Instructions (Addendum)
Medication Instructions:  Start Amiodarone 400 mg two times a day for 5 days, then 400 mg daily for 5 days, the 200 mg going forward. *If you need a refill on your cardiac medications before your next appointment, please call your pharmacy*   Lab Work: none If you have labs (blood work) drawn today and your tests are completely normal, you will receive your results only by: [place] (if you have MyChart) OR A paper copy in the mail If you have any lab test that is abnormal or we need to change your treatment, we will call you to review the results.   Testing/Procedures: Your physician has recommended that you have a Cardioversion (DCCV). Electrical Cardioversion uses a jolt of electricity to your heart either through paddles or wired patches attached to your chest. This is a controlled, usually prescheduled, procedure. Defibrillation is done under light anesthesia in the hospital, and you usually go home the day of the procedure. This is done to get your heart back into a normal rhythm. You are not awake for the procedure. Please see the instruction sheet given to you today.  Follow-Up: At [facility], you and your health needs are our priority.  As part of our continuing mission to provide you with exceptional heart care, we have created designated Provider Care Teams.  These Care Teams include your primary Cardiologist (physician) and Advanced Practice Providers (APPs -  Physician Assistants and Nurse Practitioners) who all work together to provide you with the care you need, when you need it.  We recommend signing up for the patient portal called "MyChart".  Sign up information is provided on this After Visit Summary.  MyChart is used to connect with patients for Virtual Visits (Telemedicine).  Patients are able to view lab/test results, encounter notes, upcoming appointments, etc.  Non-urgent messages can be sent to your provider as well.   To learn more about what you can do  with MyChart, go to [email or web address].    Your next appointment:   3 month(s)  The format for your next appointment:   In Person  Provider:   Lars Mage, MD    Other Instructions You are scheduled for a Cardioversion on Jun 28, 2022  with Dr.Gollan Please arrive at the [place] [facility] at 6:30  a.m. on the day of your procedure.  DIET INSTRUCTIONS:  Nothing to eat or drink after midnight except your medications with a sip of water. Hold Lasix until after.         Medications:  YOU MAY TAKE ALL of your remaining medications with a small amount of water.  Must have a responsible person to drive you home.  Bring a current list of your medications and current insurance cards.    If you have any questions after you get home, please call the office at [place]

## 2022-05-29 NOTE — Progress Notes (Signed)
History of Present Illness  There is no documented history at this time  Assessments & Plan   There are no diagnoses linked to this encounter.    Additional instructions  Subjective:  Patient presents with venous ulcer of the Bilateral lower extremity.    Procedure:  3 layer unna wrap was placed Bilateral lower extremity.   Plan:   Follow up in one week.  

## 2022-05-30 NOTE — Telephone Encounter (Signed)
2nd attempt to contact the pt with results. Unable to lmom. Pt voicemail is full.

## 2022-05-31 ENCOUNTER — Encounter (INDEPENDENT_AMBULATORY_CARE_PROVIDER_SITE_OTHER): Payer: Self-pay | Admitting: Nurse Practitioner

## 2022-06-03 ENCOUNTER — Other Ambulatory Visit
Admission: RE | Admit: 2022-06-03 | Discharge: 2022-06-03 | Disposition: A | Discharge status: Home / Self Care | Payer: Medicare Other | Source: Ambulatory Visit | Attending: Medical | Admitting: Medical

## 2022-06-03 ENCOUNTER — Ambulatory Visit: Payer: Medicare Other | Attending: Medical | Admitting: Medical

## 2022-06-03 ENCOUNTER — Encounter: Payer: Self-pay | Admitting: Medical

## 2022-06-03 VITALS — BP 132/74 | HR 79 | Ht 76.0 in | Wt 257.4 lb

## 2022-06-03 DIAGNOSIS — I4819 Other persistent atrial fibrillation: Secondary | ICD-10-CM | POA: Diagnosis not present

## 2022-06-03 DIAGNOSIS — I447 Left bundle-branch block, unspecified: Secondary | ICD-10-CM | POA: Diagnosis not present

## 2022-06-03 DIAGNOSIS — Z86718 Personal history of other venous thrombosis and embolism: Secondary | ICD-10-CM | POA: Insufficient documentation

## 2022-06-03 DIAGNOSIS — I5022 Chronic systolic (congestive) heart failure: Secondary | ICD-10-CM | POA: Insufficient documentation

## 2022-06-03 LAB — BASIC METABOLIC PANEL
Anion gap: 8 (ref 5–15)
BUN: 13 mg/dL (ref 8–23)
CO2: 25 mmol/L (ref 22–32)
Calcium: 9 mg/dL (ref 8.9–10.3)
Chloride: 110 mmol/L (ref 98–111)
Creatinine, Ser: 1.06 mg/dL (ref 0.61–1.24)
GFR, Estimated: >60 mL/min (ref >60)
Glucose, Bld: 108 mg/dL — ABNORMAL HIGH (ref 70–99)
Potassium: 4 mmol/L (ref 3.5–5.1)
Sodium: 143 mmol/L (ref 135–145)

## 2022-06-03 NOTE — Patient Instructions (Signed)
Medication Instructions:  Your physician recommends that you continue on your current medications as directed. Please refer to the Current Medication list given to you today.  *If you need a refill on your cardiac medications before your next appointment, please call your pharmacy*   Lab Work: Bmp today  Please have your lab drawn at the [facility]. Please stop at the Registration desk to check in.  If you have labs (blood work) drawn today and your tests are completely normal, you will receive your results only by: [place] (if you have MyChart) OR A paper copy in the mail If you have any lab test that is abnormal or we need to change your treatment, we will call you to review the results.   Testing/Procedures: As planned    Follow-Up: At [facility], you and your health needs are our priority.  As part of our continuing mission to provide you with exceptional heart care, we have created designated Provider Care Teams.  These Care Teams include your primary Cardiologist (physician) and Advanced Practice Providers (APPs -  Physician Assistants and Nurse Practitioners) who all work together to provide you with the care you need, when you need it.  We recommend signing up for the patient portal called "MyChart".  Sign up information is provided on this After Visit Summary.  MyChart is used to connect with patients for Virtual Visits (Telemedicine).  Patients are able to view lab/test results, encounter notes, upcoming appointments, etc.  Non-urgent messages can be sent to your provider as well.   To learn more about what you can do with MyChart, go to [email or web address].    Your next appointment:   As planned   The format for your next appointment:   In Person  Provider:   [name], MD   Other Instructions N/A  Important Information About Sugar

## 2022-06-03 NOTE — H&P (View-Only) (Signed)
Cardiology Office Note:    Date:  06/03/2022   ID:  Seth Bales Sr., DOB [birth year], MRN [id number]  PCP:  [name], MD  [facility] Cardiologist:  None  [facility] Electrophysiologist:  Vickie Epley, MD   Referring MD: [name], MD   Chief Complaint: 1 month follow-up  History of Present Illness:    Seth Fini. is a 81 y.o. male with a hx of coronary artery calcification, persistent A-fib status post DCCV on 02/22/2022 on Eliquis, HFpEF, recurrent PE status post IVC filter and on anticoagulation, COPD, chronic lower extremity swelling, and LBBB who presents for follow-up.  He was diagnosed with new onset A-fib in the ED on 10/24/2021.  He was already on anticoagulation at that time given history of recurrent PE.  Metoprolol was titrated.  He established care with Dr. Quentin Ore in 01/2022, at which time he was without symptoms of angina or decompensation.  He remained in A-fib.  Echo was ordered.  He underwent successful DCCV with a 200 J shock on 02/22/2022.   He was seen in follow-up 03/04/22 and was doing well from a cardiac persepctive. He was taking lasix with improvement of LLE. He was in NSR.    Echo showed LVEF 45-50%, global HK, mildly dilated RA, mild Aortic root dilation.  He was seen 05/06/22 and had LLE with UNNA boots on lasix occasionally. He was in rate controlled afib and sent to EP. He was started on Farxiga. Myoview lexiscan was ordered to evaluate for ischemia for reduced EF. Myoview Lexiscan showed no ischemia or scar, no significant coronary artery calcification, mild aortic atherosclerosis, overall low risk.   He saw EP 05/29/22 and was in afib at the time. He was started on amiodarone and set up for repeat cardioversion.   Today, the patient is in rate controlled afib. He is very hard of hearing. He says he started [place] and is tolerating it well, although he is very concerned with possible side effects. Some frustration voiced about  all the medications and possible side effects. We discussed GDMT, however patient is very reluctant to start more medications. He denies chest pain or SOB. He has chronic LLE that wife says is improving. He has UNNA boots that is changed out by Vascular.   Past Medical History:  Diagnosis Date   Arthritis    Chicken pox    COPD (chronic obstructive pulmonary disease) ([place])    Pulmonary emboli ([place])    on Xarelto    Past Surgical History:  Procedure Laterality Date   CARDIOVERSION N/A 02/22/2022   Procedure: CARDIOVERSION;  Surgeon: Wellington Hampshire, MD;  Location: [facility] ORS;  Service: Cardiovascular;  Laterality: N/A;   CHOLECYSTECTOMY  2008   TONSILLECTOMY AND ADENOIDECTOMY  1947    Current Medications: Current Meds  Medication Sig   ADVAIR DISKUS 250-50 MCG/ACT AEPB Inhale 1 puff into the lungs 2 (two) times daily.   albuterol (VENTOLIN HFA) 108 (90 Base) MCG/ACT inhaler Inhale 2 puffs into the lungs every 6 (six) hours as needed for wheezing.   amiodarone (PACERONE) 200 MG tablet Take 2 tablets (400 mg total) by mouth 2 (two) times daily for 5 days, THEN 2 tablets (400 mg total) daily for 5 days, THEN 1 tablet (200 mg total) daily.   apixaban (ELIQUIS) 5 MG TABS tablet Take 1 tablet (5 mg total) by mouth 2 (two) times daily.   Cobalamin Combinations (B-12) 1000-400 MCG SUBL Place 1,000 mcg under the  tongue daily.   dapagliflozin propanediol (FARXIGA) 10 MG TABS tablet Take 1 tablet (10 mg total) by mouth daily before breakfast.   INCRUSE ELLIPTA 62.5 MCG/ACT AEPB TAKE 1 PUFF BY MOUTH EVERY DAY   metoprolol succinate (TOPROL-XL) 25 MG 24 hr tablet Take 1 tablet (25 mg total) by mouth in the morning and at bedtime.   Potassium 99 MG TABS Take 1 tablet by mouth daily.   simvastatin (ZOCOR) 20 MG tablet TAKE 1 TABLET BY MOUTH EVERYDAY AT BEDTIME   traMADol (ULTRAM) 50 MG tablet Take 1 tablet (50 mg total) by mouth every 6 (six) hours as needed for severe pain.   traMADol (ULTRAM) 50 MG  tablet Take 1 tablet (50 mg total) by mouth daily as needed for moderate pain.   triamcinolone (KENALOG) 0.025 % ointment Apply 1 application  topically as needed.   VITAMIN D, CHOLECALCIFEROL, PO Take 5,000 Units by mouth daily.     Allergies:   Adhesive [tape]   Social History   Socioeconomic History   Marital status: Divorced    Spouse name: Not on file   Number of children: Not on file   Years of education: Not on file   Highest education level: Not on file  Occupational History   Not on file  Tobacco Use   Smoking status: Some Days    Packs/day: 1.00    Years: 59.00    Total pack years: 59.00    Types: Cigarettes    Last attempt to quit: 10/12/2012    Years since quitting: 9.6   Smokeless tobacco: Never  Vaping Use   Vaping Use: Never used  Substance and Sexual Activity   Alcohol use: Not Currently    Comment: rare   Drug use: No   Sexual activity: Not Currently  Other Topics Concern   Not on file  Social History Narrative   Not on file   Social Determinants of Health   Financial Resource Strain: Medium Risk (11/14/2021)   Overall Financial Resource Strain (CARDIA)    Difficulty of Paying Living Expenses: Somewhat hard  Food Insecurity: Unknown (09/19/2017)   Hunger Vital Sign    Worried About Running Out of Food in the Last Year: Patient refused    [place] in the Last Year: Patient refused  Transportation Needs: No Transportation Needs (08/22/2020)   PRAPARE - Hydrologist (Medical): No    Lack of Transportation (Non-Medical): No  Physical Activity: Unknown (08/22/2020)   Exercise Vital Sign    Days of Exercise per Week: 0 days    Minutes of Exercise per Session: Not on file  Stress: No Stress Concern Present (08/22/2020)   [place]    Feeling of Stress : Not at all  Social Connections: Unknown (08/22/2020)   Social Connection and Isolation Panel  [NHANES]    Frequency of Communication with Friends and Family: Not on file    Frequency of Social Gatherings with Friends and Family: Not on file    Attends Religious Services: Not on file    Active Member of Clubs or Organizations: Not on file    Attends Archivist Meetings: Not on file    Marital Status: Married     Family History: The patient's family history includes Arthritis in his mother; Asthma in his father; Breast cancer in his maternal grandmother; Emphysema in his father; Heart disease in his maternal grandfather and paternal grandfather; Lung  cancer in his brother.  ROS:   Please see the history of present illness.     All other systems reviewed and are negative.  EKGs/Labs/Other Studies Reviewed:    The following studies were reviewed today:  Echo 02/2022   1. Left ventricular ejection fraction, by estimation, is 45 to 50%. The  left ventricle has mildly decreased function. The left ventricle  demonstrates global hypokinesis. Left ventricular diastolic parameters are  indeterminate.   2. Right ventricular systolic function is normal. The right ventricular  size is mildly enlarged.   3. Right atrial size was mildly dilated.   4. The mitral valve is normal in structure. No evidence of mitral valve  regurgitation.   5. The aortic valve is tricuspid. Aortic valve regurgitation is not  visualized.   6. Aortic dilatation noted. There is mild dilatation of the aortic root,  measuring 41 mm.   Myoview Lexiscan 04/2022 Narrative & Impression      Normal pharmacologic myocardial perfusion stress test without evidence of significant ischemia or scar.   Left ventricular systolic function is normal by visual estimation and Siemens calculation (LVEF 58%).  LVEF of 44% by QGS calculation is most likely artifactually low due to gating parameters.   There is no significant coronary artery calcification.  Mild aortic atherosclerosis is noted on the attenuation correction  CT.   This is a low risk study.    EKG:  EKG is ordered today.  The ekg ordered today demonstrates Afib, 79bpm, LBBB, no changes  Recent Labs: 10/24/2021: B Natriuretic Peptide 98.5 03/12/2022: ALT 11; Hemoglobin 14.2; Platelets 227.0; TSH 1.57 06/03/2022: BUN 13; Creatinine, Ser 1.06; Potassium 4.0; Sodium 143  Recent Lipid Panel    Component Value Date/Time   CHOL 127 03/12/2022 1522   CHOL 151 11/19/2012 0147   TRIG 66.0 03/12/2022 1522   TRIG 71 11/19/2012 0147   HDL 40.40 03/12/2022 1522   HDL 26 (L) 11/19/2012 0147   CHOLHDL 3 03/12/2022 1522   VLDL 13.2 03/12/2022 1522   VLDL 14 11/19/2012 0147   LDLCALC 73 03/12/2022 1522   LDLCALC 111 (H) 11/19/2012 0147   LDLDIRECT 78.0 03/12/2022 1522   Physical Exam:    VS:  BP 132/74 (BP Location: Left Arm, Patient Position: Sitting, Cuff Size: Normal)   Pulse 79   Ht '6\' 4"'$  (1.93 m)   Wt 257 lb 6.4 oz (116.8 kg)   SpO2 95%   BMI 31.33 kg/m     Wt Readings from Last 3 Encounters:  06/03/22 257 lb 6.4 oz (116.8 kg)  05/29/22 261 lb (118.4 kg)  05/29/22 261 lb 6.4 oz (118.6 kg)     GEN:  Well nourished, well developed in no acute distress HEENT: Normal NECK: No JVD; No carotid bruits LYMPHATICS: No lymphadenopathy CARDIAC: Irreg Irreg, no murmurs, rubs, gallops RESPIRATORY:  Clear to auscultation without rales, wheezing or rhonchi  ABDOMEN: Soft, non-tender, non-distended MUSCULOSKELETAL:  2-3+ lower leg edema, UNNA boots; No deformity  SKIN: Warm and dry NEUROLOGIC:  Alert and oriented x 3 PSYCHIATRIC:  Normal affect   ASSESSMENT:    1. Persistent atrial fibrillation ([place])   2. Chronic systolic heart failure ([place])   3. Left bundle branch block   4. History of DVT (deep vein thrombosis)    PLAN:    In order of problems listed above:  Persistent Afib He saw Dr. Quentin Ore who started him on amiodarone with plan for repeat cardioversion. He is in afib today with rates in the  70s. Continue Eliquis '5mg'$  BID for stroke  ppx.   HFmrEF Echo showed LVEF 45-50%. He has chronic lower leg edema with UNNA boots. His wife feels lower leg edema is improved. He takes lasix as needed. Myoview Lexiscan showed no ischemia or scar, no significant coronary artery calcification, mild aortic atherosclerosis, overall low risk. Continue Farxiga and Toprol. I will check a BMET today. Given patient's frustration with medications and side effects, I will hold on GDMT until follow-up.   LBBB Stable with no syncope  H/o DVT Continue Eliquis.   Disposition: Follow up in 1 month(s) with MD/APP    Signed, Mel Langan Ninfa Meeker, PA-C  06/03/2022 1:09 PM    [facility]

## 2022-06-03 NOTE — Progress Notes (Signed)
Cardiology Office Note:    Date:  06/03/2022   ID:  Seth Bales Sr., DOB 1941-06-09, MRN [id number]  PCP:  [name], MD  [facility] Cardiologist:  None  [facility] Electrophysiologist:  Vickie Epley, MD   Referring MD: [name], MD   Chief Complaint: 1 month follow-up  History of Present Illness:    Seth Ottey. is a 81 y.o. male with a hx of coronary artery calcification, persistent A-fib status post DCCV on 02/22/2022 on Eliquis, HFpEF, recurrent PE status post IVC filter and on anticoagulation, COPD, chronic lower extremity swelling, and LBBB who presents for follow-up.  He was diagnosed with new onset A-fib in the ED on 10/24/2021.  He was already on anticoagulation at that time given history of recurrent PE.  Metoprolol was titrated.  He established care with Dr. Quentin Ore in 01/2022, at which time he was without symptoms of angina or decompensation.  He remained in A-fib.  Echo was ordered.  He underwent successful DCCV with a 200 J shock on 02/22/2022.   He was seen in follow-up 03/04/22 and was doing well from a cardiac persepctive. He was taking lasix with improvement of LLE. He was in NSR.    Echo showed LVEF 45-50%, global HK, mildly dilated RA, mild Aortic root dilation.  He was seen 05/06/22 and had LLE with UNNA boots on lasix occasionally. He was in rate controlled afib and sent to EP. He was started on Farxiga. Myoview lexiscan was ordered to evaluate for ischemia for reduced EF. Myoview Lexiscan showed no ischemia or scar, no significant coronary artery calcification, mild aortic atherosclerosis, overall low risk.   He saw EP 05/29/22 and was in afib at the time. He was started on amiodarone and set up for repeat cardioversion.   Today, the patient is in rate controlled afib. He is very hard of hearing. He says he started [place] and is tolerating it well, although he is very concerned with possible side effects. Some frustration voiced about  all the medications and possible side effects. We discussed GDMT, however patient is very reluctant to start more medications. He denies chest pain or SOB. He has chronic LLE that wife says is improving. He has UNNA boots that is changed out by Vascular.   Past Medical History:  Diagnosis Date   Arthritis    Chicken pox    COPD (chronic obstructive pulmonary disease) ([place])    Pulmonary emboli ([place])    on Xarelto    Past Surgical History:  Procedure Laterality Date   CARDIOVERSION N/A 02/22/2022   Procedure: CARDIOVERSION;  Surgeon: Wellington Hampshire, MD;  Location: [facility] ORS;  Service: Cardiovascular;  Laterality: N/A;   CHOLECYSTECTOMY  2008   TONSILLECTOMY AND ADENOIDECTOMY  1947    Current Medications: Current Meds  Medication Sig   ADVAIR DISKUS 250-50 MCG/ACT AEPB Inhale 1 puff into the lungs 2 (two) times daily.   albuterol (VENTOLIN HFA) 108 (90 Base) MCG/ACT inhaler Inhale 2 puffs into the lungs every 6 (six) hours as needed for wheezing.   amiodarone (PACERONE) 200 MG tablet Take 2 tablets (400 mg total) by mouth 2 (two) times daily for 5 days, THEN 2 tablets (400 mg total) daily for 5 days, THEN 1 tablet (200 mg total) daily.   apixaban (ELIQUIS) 5 MG TABS tablet Take 1 tablet (5 mg total) by mouth 2 (two) times daily.   Cobalamin Combinations (B-12) 1000-400 MCG SUBL Place 1,000 mcg under the  tongue daily.   dapagliflozin propanediol (FARXIGA) 10 MG TABS tablet Take 1 tablet (10 mg total) by mouth daily before breakfast.   INCRUSE ELLIPTA 62.5 MCG/ACT AEPB TAKE 1 PUFF BY MOUTH EVERY DAY   metoprolol succinate (TOPROL-XL) 25 MG 24 hr tablet Take 1 tablet (25 mg total) by mouth in the morning and at bedtime.   Potassium 99 MG TABS Take 1 tablet by mouth daily.   simvastatin (ZOCOR) 20 MG tablet TAKE 1 TABLET BY MOUTH EVERYDAY AT BEDTIME   traMADol (ULTRAM) 50 MG tablet Take 1 tablet (50 mg total) by mouth every 6 (six) hours as needed for severe pain.   traMADol (ULTRAM) 50 MG  tablet Take 1 tablet (50 mg total) by mouth daily as needed for moderate pain.   triamcinolone (KENALOG) 0.025 % ointment Apply 1 application  topically as needed.   VITAMIN D, CHOLECALCIFEROL, PO Take 5,000 Units by mouth daily.     Allergies:   Adhesive [tape]   Social History   Socioeconomic History   Marital status: Divorced    Spouse name: Not on file   Number of children: Not on file   Years of education: Not on file   Highest education level: Not on file  Occupational History   Not on file  Tobacco Use   Smoking status: Some Days    Packs/day: 1.00    Years: 59.00    Total pack years: 59.00    Types: Cigarettes    Last attempt to quit: 10/12/2012    Years since quitting: 9.6   Smokeless tobacco: Never  Vaping Use   Vaping Use: Never used  Substance and Sexual Activity   Alcohol use: Not Currently    Comment: rare   Drug use: No   Sexual activity: Not Currently  Other Topics Concern   Not on file  Social History Narrative   Not on file   Social Determinants of Health   Financial Resource Strain: Medium Risk (11/14/2021)   Overall Financial Resource Strain (CARDIA)    Difficulty of Paying Living Expenses: Somewhat hard  Food Insecurity: Unknown (09/19/2017)   Hunger Vital Sign    Worried About Running Out of Food in the Last Year: Patient refused    [place] in the Last Year: Patient refused  Transportation Needs: No Transportation Needs (08/22/2020)   PRAPARE - Hydrologist (Medical): No    Lack of Transportation (Non-Medical): No  Physical Activity: Unknown (08/22/2020)   Exercise Vital Sign    Days of Exercise per Week: 0 days    Minutes of Exercise per Session: Not on file  Stress: No Stress Concern Present (08/22/2020)   [place]    Feeling of Stress : Not at all  Social Connections: Unknown (08/22/2020)   Social Connection and Isolation Panel  [NHANES]    Frequency of Communication with Friends and Family: Not on file    Frequency of Social Gatherings with Friends and Family: Not on file    Attends Religious Services: Not on file    Active Member of Clubs or Organizations: Not on file    Attends Archivist Meetings: Not on file    Marital Status: Married     Family History: The patient's family history includes Arthritis in his mother; Asthma in his father; Breast cancer in his maternal grandmother; Emphysema in his father; Heart disease in his maternal grandfather and paternal grandfather; Lung  cancer in his brother.  ROS:   Please see the history of present illness.     All other systems reviewed and are negative.  EKGs/Labs/Other Studies Reviewed:    The following studies were reviewed today:  Echo 02/2022   1. Left ventricular ejection fraction, by estimation, is 45 to 50%. The  left ventricle has mildly decreased function. The left ventricle  demonstrates global hypokinesis. Left ventricular diastolic parameters are  indeterminate.   2. Right ventricular systolic function is normal. The right ventricular  size is mildly enlarged.   3. Right atrial size was mildly dilated.   4. The mitral valve is normal in structure. No evidence of mitral valve  regurgitation.   5. The aortic valve is tricuspid. Aortic valve regurgitation is not  visualized.   6. Aortic dilatation noted. There is mild dilatation of the aortic root,  measuring 41 mm.   Myoview Lexiscan 04/2022 Narrative & Impression      Normal pharmacologic myocardial perfusion stress test without evidence of significant ischemia or scar.   Left ventricular systolic function is normal by visual estimation and Siemens calculation (LVEF 58%).  LVEF of 44% by QGS calculation is most likely artifactually low due to gating parameters.   There is no significant coronary artery calcification.  Mild aortic atherosclerosis is noted on the attenuation correction  CT.   This is a low risk study.    EKG:  EKG is ordered today.  The ekg ordered today demonstrates Afib, 79bpm, LBBB, no changes  Recent Labs: 10/24/2021: B Natriuretic Peptide 98.5 03/12/2022: ALT 11; Hemoglobin 14.2; Platelets 227.0; TSH 1.57 06/03/2022: BUN 13; Creatinine, Ser 1.06; Potassium 4.0; Sodium 143  Recent Lipid Panel    Component Value Date/Time   CHOL 127 03/12/2022 1522   CHOL 151 11/19/2012 0147   TRIG 66.0 03/12/2022 1522   TRIG 71 11/19/2012 0147   HDL 40.40 03/12/2022 1522   HDL 26 (L) 11/19/2012 0147   CHOLHDL 3 03/12/2022 1522   VLDL 13.2 03/12/2022 1522   VLDL 14 11/19/2012 0147   LDLCALC 73 03/12/2022 1522   LDLCALC 111 (H) 11/19/2012 0147   LDLDIRECT 78.0 03/12/2022 1522   Physical Exam:    VS:  BP 132/74 (BP Location: Left Arm, Patient Position: Sitting, Cuff Size: Normal)   Pulse 79   Ht '6\' 4"'$  (1.93 m)   Wt 257 lb 6.4 oz (116.8 kg)   SpO2 95%   BMI 31.33 kg/m     Wt Readings from Last 3 Encounters:  06/03/22 257 lb 6.4 oz (116.8 kg)  05/29/22 261 lb (118.4 kg)  05/29/22 261 lb 6.4 oz (118.6 kg)     GEN:  Well nourished, well developed in no acute distress HEENT: Normal NECK: No JVD; No carotid bruits LYMPHATICS: No lymphadenopathy CARDIAC: Irreg Irreg, no murmurs, rubs, gallops RESPIRATORY:  Clear to auscultation without rales, wheezing or rhonchi  ABDOMEN: Soft, non-tender, non-distended MUSCULOSKELETAL:  2-3+ lower leg edema, UNNA boots; No deformity  SKIN: Warm and dry NEUROLOGIC:  Alert and oriented x 3 PSYCHIATRIC:  Normal affect   ASSESSMENT:    1. Persistent atrial fibrillation ([place])   2. Chronic systolic heart failure ([place])   3. Left bundle branch block   4. History of DVT (deep vein thrombosis)    PLAN:    In order of problems listed above:  Persistent Afib He saw Dr. Quentin Ore who started him on amiodarone with plan for repeat cardioversion. He is in afib today with rates in the  70s. Continue Eliquis '5mg'$  BID for stroke  ppx.   HFmrEF Echo showed LVEF 45-50%. He has chronic lower leg edema with UNNA boots. His wife feels lower leg edema is improved. He takes lasix as needed. Myoview Lexiscan showed no ischemia or scar, no significant coronary artery calcification, mild aortic atherosclerosis, overall low risk. Continue Farxiga and Toprol. I will check a BMET today. Given patient's frustration with medications and side effects, I will hold on GDMT until follow-up.   LBBB Stable with no syncope  H/o DVT Continue Eliquis.   Disposition: Follow up in 1 month(s) with MD/APP    Signed, Wheeler Incorvaia Ninfa Meeker, PA-C  06/03/2022 1:09 PM    [facility]

## 2022-06-05 ENCOUNTER — Encounter (INDEPENDENT_AMBULATORY_CARE_PROVIDER_SITE_OTHER): Payer: Self-pay

## 2022-06-05 ENCOUNTER — Ambulatory Visit (INDEPENDENT_AMBULATORY_CARE_PROVIDER_SITE_OTHER): Payer: Medicare Other | Admitting: Nurse Practitioner

## 2022-06-05 VITALS — BP 110/65 | HR 80 | Resp 16 | Wt 257.6 lb

## 2022-06-05 DIAGNOSIS — I890 Lymphedema, not elsewhere classified: Secondary | ICD-10-CM

## 2022-06-05 NOTE — Progress Notes (Signed)
History of Present Illness  There is no documented history at this time  Assessments & Plan   There are no diagnoses linked to this encounter.    Additional instructions  Subjective:  Patient presents with venous ulcer of the Bilateral lower extremity.    Procedure:  3 layer unna wrap was placed Bilateral lower extremity.   Plan:   Follow up in one week.  

## 2022-06-07 ENCOUNTER — Encounter (INDEPENDENT_AMBULATORY_CARE_PROVIDER_SITE_OTHER): Payer: Self-pay | Admitting: Nurse Practitioner

## 2022-06-09 ENCOUNTER — Encounter (INDEPENDENT_AMBULATORY_CARE_PROVIDER_SITE_OTHER): Payer: Self-pay | Admitting: Nurse Practitioner

## 2022-06-09 NOTE — Progress Notes (Signed)
Subjective:    Patient ID: Seth Perez., male    DOB: 1941-09-16, 81 y.o.   MRN: [id number] Chief Complaint  Patient presents with   Follow-up    Unna wrap follow up    Seth Perez Sr. is a 81 y.o. male.  The patient returns to the office today after several weeks of arise.  The swelling is under greater control however he still continues to have some open areas.  There are no signs symptoms of cellulitis.  No further weeping.  He continues to have stasis dermatitis changes bilaterally.  He has been tolerating the Unna wraps well.     Review of Systems  Cardiovascular:  Positive for leg swelling.  Skin:  Positive for wound.  All other systems reviewed and are negative.      Objective:   Physical Exam Vitals reviewed.  HENT:     Head: Normocephalic.  Cardiovascular:     Rate and Rhythm: Normal rate.  Pulmonary:     Effort: Pulmonary effort is normal.  Musculoskeletal:     Right lower leg: Edema present.     Left lower leg: Edema present.  Skin:    General: Skin is warm and dry.  Neurological:     Mental Status: He is alert and oriented to person, place, and time.  Psychiatric:        Mood and Affect: Mood normal.        Behavior: Behavior normal.        Judgment: Judgment normal.     BP 129/87 (BP Location: Left Arm)   Pulse (!) 109   Resp 16   Wt 263 lb 9.6 oz (119.6 kg)   BMI 32.09 kg/m   Past Medical History:  Diagnosis Date   Arthritis    Chicken pox    COPD (chronic obstructive pulmonary disease) (HCC)    Pulmonary emboli (HCC)    on Xarelto    Social History   Socioeconomic History   Marital status: Divorced    Spouse name: Not on file   Number of children: Not on file   Years of education: Not on file   Highest education level: Not on file  Occupational History   Not on file  Tobacco Use   Smoking status: Some Days    Packs/day: 1.00    Years: 59.00    Total pack years: 59.00    Types: Cigarettes    Last attempt to  quit: 10/12/2012    Years since quitting: 9.6   Smokeless tobacco: Never  Vaping Use   Vaping Use: Never used  Substance and Sexual Activity   Alcohol use: Not Currently    Comment: rare   Drug use: No   Sexual activity: Not Currently  Other Topics Concern   Not on file  Social History Narrative   Not on file   Social Determinants of Health   Financial Resource Strain: Medium Risk (11/14/2021)   Overall Financial Resource Strain (CARDIA)    Difficulty of Paying Living Expenses: Somewhat hard  Food Insecurity: Unknown (09/19/2017)   Hunger Vital Sign    Worried About Running Out of Food in the Last Year: Patient refused    [place] in the Last Year: Patient refused  Transportation Needs: No Transportation Needs (08/22/2020)   PRAPARE - Hydrologist (Medical): No    Lack of Transportation (Non-Medical): No  Physical Activity: Unknown (08/22/2020)   Exercise Vital Sign  Days of Exercise per Week: 0 days    Minutes of Exercise per Session: Not on file  Stress: No Stress Concern Present (08/22/2020)   [place]    Feeling of Stress : Not at all  Social Connections: Unknown (08/22/2020)   Social Connection and Isolation Panel [NHANES]    Frequency of Communication with Friends and Family: Not on file    Frequency of Social Gatherings with Friends and Family: Not on file    Attends Religious Services: Not on file    Active Member of Clubs or Organizations: Not on file    Attends Club or Organization Meetings: Not on file    Marital Status: Married  Intimate Partner Violence: Not At Risk (08/22/2020)   Humiliation, Afraid, Rape, and Kick questionnaire    Fear of Current or Ex-Partner: No    Emotionally Abused: No    Physically Abused: No    Sexually Abused: No    Past Surgical History:  Procedure Laterality Date   CARDIOVERSION N/A 02/22/2022   Procedure: CARDIOVERSION;   Surgeon: Wellington Hampshire, MD;  Location: [facility] ORS;  Service: Cardiovascular;  Laterality: N/A;   CHOLECYSTECTOMY  2008   TONSILLECTOMY AND ADENOIDECTOMY  1947    Family History  Problem Relation Age of Onset   Arthritis Mother    Lung cancer Brother        was a smoker   Breast cancer Maternal Grandmother    Heart disease Maternal Grandfather    Asthma Father    Emphysema Father    Heart disease Paternal Grandfather     Allergies  Allergen Reactions   Adhesive [Tape] Rash       Latest Ref Rng & Units 03/12/2022    3:22 PM 10/24/2021    4:24 PM 10/17/2021   10:30 AM  CBC  WBC 4.0 - 10.5 K/uL 6.5  8.1  7.3   Hemoglobin 13.0 - 17.0 g/dL 14.2  13.7  12.9   Hematocrit 39.0 - 52.0 % 41.9  41.8  40.0   Platelets 150.0 - 400.0 K/uL 227.0  181  201.0       CMP     Component Value Date/Time   NA 143 06/03/2022 1240   NA 139 01/26/2014 0400   K 4.0 06/03/2022 1240   K 3.9 01/26/2014 0400   CL 110 06/03/2022 1240   CL 108 (H) 01/26/2014 0400   CO2 25 06/03/2022 1240   CO2 26 01/26/2014 0400   GLUCOSE 108 (H) 06/03/2022 1240   GLUCOSE 99 01/26/2014 0400   BUN 13 06/03/2022 1240   BUN 11 01/26/2014 0400   CREATININE 1.06 06/03/2022 1240   CREATININE 1.05 01/26/2014 0400   CALCIUM 9.0 06/03/2022 1240   CALCIUM 8.5 01/26/2014 0400   PROT 7.2 03/12/2022 1522   PROT 8.0 01/21/2014 1945   ALBUMIN 4.0 03/12/2022 1522   ALBUMIN 3.8 01/21/2014 1945   AST 14 03/12/2022 1522   AST 35 01/21/2014 1945   ALT 11 03/12/2022 1522   ALT 59 01/21/2014 1945   ALKPHOS 72 03/12/2022 1522   ALKPHOS 105 01/21/2014 1945   BILITOT 0.6 03/12/2022 1522   BILITOT 1.0 01/21/2014 1945   GFRNONAA >60 06/03/2022 1240   GFRNONAA >60 01/26/2014 0400   GFRAA >60 08/17/2019 0431   GFRAA >60 01/26/2014 0400     No results found.     Assessment & Plan:   1. Lymphedema The patient's lower extremity edema is improved but  he continues to have wounds and ulcerations.  The patient has tolerated  the Unna wraps.  We will continue to have the patient in [place] wraps.  He will return in weekly for wrap changes.  He will return in 4 weeks for evaluation 2. Hyperlipidemia with low HDL Continue statin as ordered and reviewed, no changes at this time    Current Outpatient Medications on File Prior to Visit  Medication Sig Dispense Refill   ADVAIR DISKUS 250-50 MCG/ACT AEPB Inhale 1 puff into the lungs 2 (two) times daily.     albuterol (VENTOLIN HFA) 108 (90 Base) MCG/ACT inhaler Inhale 2 puffs into the lungs every 6 (six) hours as needed for wheezing. 6.7 g 11   apixaban (ELIQUIS) 5 MG TABS tablet Take 1 tablet (5 mg total) by mouth 2 (two) times daily. 60 tablet 0   Cobalamin Combinations (B-12) 1000-400 MCG SUBL Place 1,000 mcg under the tongue daily. 90 tablet 3   dapagliflozin propanediol (FARXIGA) 10 MG TABS tablet Take 1 tablet (10 mg total) by mouth daily before breakfast. 30 tablet 5   furosemide (LASIX) 20 MG tablet Take 1 tablet (20 mg total) by mouth daily. (Patient not taking: Reported on 06/03/2022) 30 tablet 3   INCRUSE ELLIPTA 62.5 MCG/ACT AEPB TAKE 1 PUFF BY MOUTH EVERY DAY 30 each 2   metoprolol succinate (TOPROL-XL) 25 MG 24 hr tablet Take 1 tablet (25 mg total) by mouth in the morning and at bedtime. 180 tablet 3   Potassium 99 MG TABS Take 1 tablet by mouth daily.     simvastatin (ZOCOR) 20 MG tablet TAKE 1 TABLET BY MOUTH EVERYDAY AT BEDTIME 90 tablet 1   traMADol (ULTRAM) 50 MG tablet Take 1 tablet (50 mg total) by mouth every 6 (six) hours as needed for severe pain. 15 tablet 0   traMADol (ULTRAM) 50 MG tablet Take 1 tablet (50 mg total) by mouth daily as needed for moderate pain. 30 tablet 2   triamcinolone (KENALOG) 0.025 % ointment Apply 1 application  topically as needed.     VITAMIN D, CHOLECALCIFEROL, PO Take 5,000 Units by mouth daily.     No current facility-administered medications on file prior to visit.    There are no Patient Instructions on file for this  visit. No follow-ups on file.   Kris Hartmann, NP

## 2022-06-12 ENCOUNTER — Encounter: Payer: Self-pay | Admitting: Internal Medicine

## 2022-06-12 ENCOUNTER — Ambulatory Visit (INDEPENDENT_AMBULATORY_CARE_PROVIDER_SITE_OTHER): Payer: Medicare Other | Admitting: Nurse Practitioner

## 2022-06-12 ENCOUNTER — Ambulatory Visit (INDEPENDENT_AMBULATORY_CARE_PROVIDER_SITE_OTHER): Payer: Medicare Other | Admitting: Internal Medicine

## 2022-06-12 ENCOUNTER — Encounter (INDEPENDENT_AMBULATORY_CARE_PROVIDER_SITE_OTHER): Payer: Self-pay

## 2022-06-12 VITALS — BP 134/89 | HR 86 | Resp 16

## 2022-06-12 DIAGNOSIS — I2510 Atherosclerotic heart disease of native coronary artery without angina pectoris: Secondary | ICD-10-CM

## 2022-06-12 DIAGNOSIS — I4819 Other persistent atrial fibrillation: Secondary | ICD-10-CM | POA: Diagnosis not present

## 2022-06-12 DIAGNOSIS — I890 Lymphedema, not elsewhere classified: Secondary | ICD-10-CM

## 2022-06-12 DIAGNOSIS — Z23 Encounter for immunization: Secondary | ICD-10-CM | POA: Diagnosis not present

## 2022-06-12 DIAGNOSIS — E1159 Type 2 diabetes mellitus with other circulatory complications: Secondary | ICD-10-CM

## 2022-06-12 DIAGNOSIS — R918 Other nonspecific abnormal finding of lung field: Secondary | ICD-10-CM | POA: Diagnosis not present

## 2022-06-12 DIAGNOSIS — E1129 Type 2 diabetes mellitus with other diabetic kidney complication: Secondary | ICD-10-CM

## 2022-06-12 DIAGNOSIS — J432 Centrilobular emphysema: Secondary | ICD-10-CM

## 2022-06-12 DIAGNOSIS — I2584 Coronary atherosclerosis due to calcified coronary lesion: Secondary | ICD-10-CM

## 2022-06-12 DIAGNOSIS — R809 Proteinuria, unspecified: Secondary | ICD-10-CM | POA: Diagnosis not present

## 2022-06-12 HISTORY — DX: Type 2 diabetes mellitus with other circulatory complications: E11.59

## 2022-06-12 LAB — POCT GLYCOSYLATED HEMOGLOBIN (HGB A1C): Hemoglobin A1C: 5.3 % (ref 4.0–5.6)

## 2022-06-12 NOTE — Progress Notes (Signed)
History of Present Illness  There is no documented history at this time  Assessments & Plan   There are no diagnoses linked to this encounter.    Additional instructions  Subjective:  Patient presents with venous ulcer of the Bilateral lower extremity.    Procedure:  3 layer unna wrap was placed Bilateral lower extremity.   Plan:   Follow up in one week.  

## 2022-06-12 NOTE — Assessment & Plan Note (Addendum)
S/p unsuccessful cardioversion in July,  With repeat planned in early October.  Continue amiodarone and metoprolol .   He is anticoagulated with Eliquis

## 2022-06-12 NOTE — Assessment & Plan Note (Signed)
Managed with [place] .  Well controlled.  Continue statin.  Urine microalbumin to cr ratio needed.

## 2022-06-12 NOTE — Progress Notes (Unsigned)
Subjective:  Patient ID: Seth Perez., male    DOB: [birth year]  Age: 81 y.o. MRN: [id number]  CC: The primary encounter diagnosis was Pulmonary nodules/lesions, multiple. Diagnoses of Type 2 diabetes mellitus with vascular disease ([place]), Need for immunization against influenza, Centrilobular emphysema ([place]), Multiple pulmonary nodules determined by computed tomography of lung, and Persistent atrial fibrillation ([place]) were also pertinent to this visit.   HPI Seth Bales Sr. presents for  Chief Complaint  Patient presents with   Follow-up    3 month follow up    hx of coronary artery calcification, persistent A-fib status post DCCV on 02/22/2022 on Eliquis, HFpEF, recurrent PE status post IVC filter and on anticoagulation, COPD, chronic lower extremity swelling, and LBBB who presents for follow-up.  Accompanied by companion of 40 yrs (not his wife)  A 2ND cardioversion is planned for October to address low EF of 44% noted by August myoview  HTN  at goal   CHF:  EF 44% by Myoview in August.  Weight is stable at 250 but does not weigh daily.  Has chronic LE due to lymphedema .  Takes furosemide once daily and [place].   Recent fall on wet grass   scraped knees from crawling onto concrete . Still has scabs .  Doesn't want PT. Uses a 4 footed cane in each hand   Type 2 DM: diagnosed several years ago with a1c of 6.6  taking Farxiga only.  Does not check  BS.    COPD/pulmonary nodules:  no recent exacerbations.  Last Chest CT was march 2021.  Since the dissolution of the pulmonary nodule clinic he has been lost to follow up   Lymphedema:    Outpatient Medications Prior to Visit  Medication Sig Dispense Refill   ADVAIR DISKUS 250-50 MCG/ACT AEPB Inhale 1 puff into the lungs 2 (two) times daily.     albuterol (VENTOLIN HFA) 108 (90 Base) MCG/ACT inhaler Inhale 2 puffs into the lungs every 6 (six) hours as needed for wheezing. 6.7 g 11   amiodarone (PACERONE) 200 MG tablet Take 2  tablets (400 mg total) by mouth 2 (two) times daily for 5 days, THEN 2 tablets (400 mg total) daily for 5 days, THEN 1 tablet (200 mg total) daily. 90 tablet 3   apixaban (ELIQUIS) 5 MG TABS tablet Take 1 tablet (5 mg total) by mouth 2 (two) times daily. 60 tablet 0   Cobalamin Combinations (B-12) 1000-400 MCG SUBL Place 1,000 mcg under the tongue daily. 90 tablet 3   dapagliflozin propanediol (FARXIGA) 10 MG TABS tablet Take 1 tablet (10 mg total) by mouth daily before breakfast. 30 tablet 5   furosemide (LASIX) 20 MG tablet Take 1 tablet (20 mg total) by mouth daily. 30 tablet 3   INCRUSE ELLIPTA 62.5 MCG/ACT AEPB TAKE 1 PUFF BY MOUTH EVERY DAY 30 each 2   metoprolol succinate (TOPROL-XL) 25 MG 24 hr tablet Take 1 tablet (25 mg total) by mouth in the morning and at bedtime. 180 tablet 3   Potassium 99 MG TABS Take 1 tablet by mouth daily.     simvastatin (ZOCOR) 20 MG tablet TAKE 1 TABLET BY MOUTH EVERYDAY AT BEDTIME 90 tablet 1   traMADol (ULTRAM) 50 MG tablet Take 1 tablet (50 mg total) by mouth daily as needed for moderate pain. 30 tablet 2   triamcinolone (KENALOG) 0.025 % ointment Apply 1 application  topically as needed.     VITAMIN D, CHOLECALCIFEROL, PO Take  5,000 Units by mouth daily.     traMADol (ULTRAM) 50 MG tablet Take 1 tablet (50 mg total) by mouth every 6 (six) hours as needed for severe pain. (Patient not taking: Reported on 06/12/2022) 15 tablet 0   No facility-administered medications prior to visit.    Review of Systems;  Patient denies headache, fevers, malaise, unintentional weight loss, skin rash, eye pain, sinus congestion and sinus pain, sore throat, dysphagia,  hemoptysis , cough, dyspnea, wheezing, chest pain, palpitations, orthopnea, edema, abdominal pain, nausea, melena, diarrhea, constipation, flank pain, dysuria, hematuria, urinary  Frequency, nocturia, numbness, tingling, seizures,  Focal weakness, Loss of consciousness,  Tremor, insomnia, depression, anxiety, and  suicidal ideation.      Objective:  There were no vitals taken for this visit.  BP Readings from Last 3 Encounters:  06/05/22 110/65  06/03/22 132/74  05/29/22 107/72    Wt Readings from Last 3 Encounters:  06/05/22 257 lb 9.6 oz (116.8 kg)  06/03/22 257 lb 6.4 oz (116.8 kg)  05/29/22 261 lb (118.4 kg)    General appearance: alert, cooperative and appears stated age Ears: normal TM's and external ear canals both ears Throat: lips, mucosa, and tongue normal; teeth and gums normal Neck: no adenopathy, no carotid bruit, supple, symmetrical, trachea midline and thyroid not enlarged, symmetric, no tenderness/mass/nodules Back: symmetric, no curvature. ROM normal. No CVA tenderness. Lungs: clear to auscultation bilaterally Heart: regular rate and rhythm, S1, S2 normal, no murmur, click, rub or gallop Abdomen: soft, non-tender; bowel sounds normal; no masses,  no organomegaly Pulses: 2+ and symmetric Skin: Skin color, texture, turgor normal. No rashes or lesions Lymph nodes: Cervical, supraclavicular, and axillary nodes normal. Neuro:  awake and interactive with normal mood and affect. Higher cortical functions are normal. Speech is clear without word-finding difficulty or dysarthria. Extraocular movements are intact. Visual fields of both eyes are grossly intact. Sensation to light touch is grossly intact bilaterally of upper and lower extremities. Motor examination shows 4+/5 symmetric hand grip and upper extremity and 5/5 lower extremity strength. There is no pronation or drift. Gait is non-ataxic   Lab Results  Component Value Date   HGBA1C 5.7 03/12/2022   HGBA1C 5.5 09/22/2017   HGBA1C 5.7 10/24/2012    Lab Results  Component Value Date   CREATININE 1.06 06/03/2022   CREATININE 0.85 05/08/2022   CREATININE 0.90 03/12/2022    Lab Results  Component Value Date   WBC 6.5 03/12/2022   HGB 14.2 03/12/2022   HCT 41.9 03/12/2022   PLT 227.0 03/12/2022   GLUCOSE 108 (H)  06/03/2022   CHOL 127 03/12/2022   TRIG 66.0 03/12/2022   HDL 40.40 03/12/2022   LDLDIRECT 78.0 03/12/2022   LDLCALC 73 03/12/2022   ALT 11 03/12/2022   AST 14 03/12/2022   NA 143 06/03/2022   K 4.0 06/03/2022   CL 110 06/03/2022   CREATININE 1.06 06/03/2022   BUN 13 06/03/2022   CO2 25 06/03/2022   TSH 1.57 03/12/2022   PSA 11.44 (H) 11/24/2012   INR 1.5 01/26/2014   HGBA1C 5.7 03/12/2022    No results found.  Assessment & Plan:   Problem List Items Addressed This Visit     Atrial fibrillation [facility])    S/p unsuccessful cardioversion in July,  With repeat planned in early October.  Continue amiodarone and metoprolol .   He is anticoagulated with Eliquis      COPD (chronic obstructive pulmonary disease) with emphysema (HCC)    Currently asymptomatic  on Incruse Ellipta and Advair. RSV vaccine recommended.   Influenza vaccine given       Multiple pulmonary nodules determined by computed tomography of lung    Referring to pulmonology for follow up  Last CT was March 2021      Type 2 diabetes mellitus with vascular disease ([place])    Managed with [place] .  Well controlled.  Continue statin.  Urine microalbumin to cr ratio needed.       Relevant Orders   POCT HgB A1C   Urine Microalbumin w/creat. ratio   Other Visit Diagnoses     Pulmonary nodules/lesions, multiple    -  Primary   Relevant Orders   Ambulatory referral to Pulmonology   Need for immunization against influenza       Relevant Orders   Flu Vaccine QUAD High Dose(Fluad) (Completed)       I spent a total of   minutes with this patient in a face to face visit on the date of this encounter reviewing the last office visit with me in       ,  most recent visit with cardiology ,    ,  patient's diet and exercise habits, home blood pressure /blod sugar readings, recent ER visit including labs and imaging studies ,   and post visit ordering of testing and therapeutics.    Follow-up: Return in about 6 months  (around 12/11/2022) for follow up diabetes.   [name], MD

## 2022-06-12 NOTE — Patient Instructions (Addendum)
I do recommend the RSV vaccine for you .  It is now available through your pharmacy [facility]   I'll see you in 6 months

## 2022-06-12 NOTE — Assessment & Plan Note (Signed)
Currently asymptomatic on Incruse Ellipta and Advair. RSV vaccine recommended.   Influenza vaccine given

## 2022-06-12 NOTE — Assessment & Plan Note (Signed)
Referring to pulmonology for follow up  Last CT was March 2021

## 2022-06-13 ENCOUNTER — Other Ambulatory Visit: Payer: Self-pay

## 2022-06-13 DIAGNOSIS — E1129 Type 2 diabetes mellitus with other diabetic kidney complication: Secondary | ICD-10-CM | POA: Insufficient documentation

## 2022-06-13 LAB — MICROALBUMIN / CREATININE URINE RATIO
Creatinine,U: 75.3 mg/dL
Microalb Creat Ratio: 3.1 mg/g (ref 0.0–30.0)
Microalb, Ur: 2.3 mg/dL — ABNORMAL HIGH (ref 0.0–1.9)

## 2022-06-13 MED ORDER — TELMISARTAN 20 MG PO TABS
20.0000 mg | ORAL_TABLET | Freq: Every day | ORAL | 2 refills | Status: DC
Start: 2022-06-13 — End: 2022-07-12

## 2022-06-13 NOTE — Assessment & Plan Note (Signed)
Starting telmisartan 20 mg .  rtc for bmet 1 week  Lab Results  Component Value Date   NA 143 06/03/2022   K 4.0 06/03/2022   CL 110 06/03/2022   CO2 25 06/03/2022   Lab Results  Component Value Date   CREATININE 1.06 06/03/2022

## 2022-06-13 NOTE — Addendum Note (Signed)
Addended by: [name] on: 06/13/2022 02:03 PM   Modules accepted: Orders

## 2022-06-14 NOTE — Addendum Note (Signed)
Addended by: Leeanne Rio on: 06/14/2022 12:32 PM   Modules accepted: Orders

## 2022-06-21 ENCOUNTER — Other Ambulatory Visit (INDEPENDENT_AMBULATORY_CARE_PROVIDER_SITE_OTHER): Payer: Medicare Other

## 2022-06-21 DIAGNOSIS — E1129 Type 2 diabetes mellitus with other diabetic kidney complication: Secondary | ICD-10-CM | POA: Diagnosis not present

## 2022-06-21 DIAGNOSIS — R809 Proteinuria, unspecified: Secondary | ICD-10-CM

## 2022-06-21 LAB — COMPREHENSIVE METABOLIC PANEL
ALT: 19 U/L (ref 0–53)
AST: 16 U/L (ref 0–37)
Albumin: 4.3 g/dL (ref 3.5–5.2)
Alkaline Phosphatase: 65 U/L (ref 39–117)
BUN: 18 mg/dL (ref 6–23)
CO2: 30 mEq/L (ref 19–32)
Calcium: 9.4 mg/dL (ref 8.4–10.5)
Chloride: 104 mEq/L (ref 96–112)
Creatinine, Ser: 1.19 mg/dL (ref 0.40–1.50)
GFR: 56.99 mL/min — ABNORMAL LOW (ref >60.00)
Glucose, Bld: 97 mg/dL (ref 70–99)
Potassium: 4.3 mEq/L (ref 3.5–5.1)
Sodium: 142 mEq/L (ref 135–145)
Total Bilirubin: 0.8 mg/dL (ref 0.2–1.2)
Total Protein: 7.1 g/dL (ref 6.0–8.3)

## 2022-06-25 ENCOUNTER — Ambulatory Visit (INDEPENDENT_AMBULATORY_CARE_PROVIDER_SITE_OTHER): Payer: Medicare Other | Admitting: Vascular Surgery

## 2022-06-25 ENCOUNTER — Encounter (INDEPENDENT_AMBULATORY_CARE_PROVIDER_SITE_OTHER): Payer: Self-pay | Admitting: Vascular Surgery

## 2022-06-25 VITALS — BP 109/70 | HR 84 | Resp 16 | Wt 252.4 lb

## 2022-06-25 DIAGNOSIS — I2510 Atherosclerotic heart disease of native coronary artery without angina pectoris: Secondary | ICD-10-CM

## 2022-06-25 DIAGNOSIS — E1159 Type 2 diabetes mellitus with other circulatory complications: Secondary | ICD-10-CM

## 2022-06-25 DIAGNOSIS — I87009 Postthrombotic syndrome without complications of unspecified extremity: Secondary | ICD-10-CM

## 2022-06-25 DIAGNOSIS — I890 Lymphedema, not elsewhere classified: Secondary | ICD-10-CM | POA: Diagnosis not present

## 2022-06-25 DIAGNOSIS — I2699 Other pulmonary embolism without acute cor pulmonale: Secondary | ICD-10-CM | POA: Diagnosis not present

## 2022-06-25 DIAGNOSIS — I2584 Coronary atherosclerosis due to calcified coronary lesion: Secondary | ICD-10-CM | POA: Diagnosis not present

## 2022-06-25 NOTE — Progress Notes (Signed)
MRN : [id number]  Seth INGLE Sr. is a 81 y.o. (12-Jun-1941) male who presents with chief complaint of  Chief Complaint  Patient presents with   Follow-up    Unna wrap check  .  History of Present Illness: Patient returns today in follow up of his leg swelling and ulceration.  He has been in [employer] now for a couple of months on a weekly basis.  His legs look much better.  He is down to just having scabs and flaking skin with no obvious open wounds or drainage.  The weeping is gone.  Swelling is better but still significant.  Current Outpatient Medications  Medication Sig Dispense Refill   ADVAIR DISKUS 250-50 MCG/ACT AEPB Inhale 1 puff into the lungs 2 (two) times daily.     albuterol (VENTOLIN HFA) 108 (90 Base) MCG/ACT inhaler Inhale 2 puffs into the lungs every 6 (six) hours as needed for wheezing. 6.7 g 11   amiodarone (PACERONE) 200 MG tablet Take 2 tablets (400 mg total) by mouth 2 (two) times daily for 5 days, THEN 2 tablets (400 mg total) daily for 5 days, THEN 1 tablet (200 mg total) daily. 90 tablet 3   apixaban (ELIQUIS) 5 MG TABS tablet Take 1 tablet (5 mg total) by mouth 2 (two) times daily. 60 tablet 0   Cobalamin Combinations (B-12) 1000-400 MCG SUBL Place 1,000 mcg under the tongue daily. 90 tablet 3   dapagliflozin propanediol (FARXIGA) 10 MG TABS tablet Take 1 tablet (10 mg total) by mouth daily before breakfast. 30 tablet 5   furosemide (LASIX) 20 MG tablet Take 1 tablet (20 mg total) by mouth daily. 30 tablet 3   INCRUSE ELLIPTA 62.5 MCG/ACT AEPB TAKE 1 PUFF BY MOUTH EVERY DAY 30 each 2   metoprolol succinate (TOPROL-XL) 25 MG 24 hr tablet Take 1 tablet (25 mg total) by mouth in the morning and at bedtime. 180 tablet 3   Potassium 99 MG TABS Take 1 tablet by mouth daily.     simvastatin (ZOCOR) 20 MG tablet TAKE 1 TABLET BY MOUTH EVERYDAY AT BEDTIME 90 tablet 1   telmisartan (MICARDIS) 20 MG tablet Take 1 tablet (20 mg total) by mouth daily. 30 tablet 2    triamcinolone (KENALOG) 0.025 % ointment Apply 1 application  topically as needed.     VITAMIN D, CHOLECALCIFEROL, PO Take 5,000 Units by mouth daily.     No current facility-administered medications for this visit.    Past Medical History:  Diagnosis Date   Arthritis    Chicken pox    COPD (chronic obstructive pulmonary disease) ([place])    Pulmonary emboli ([place])    on Xarelto    Past Surgical History:  Procedure Laterality Date   CARDIOVERSION N/A 02/22/2022   Procedure: CARDIOVERSION;  Surgeon: Wellington Hampshire, MD;  Location: [facility] ORS;  Service: Cardiovascular;  Laterality: N/A;   CHOLECYSTECTOMY  2008   TONSILLECTOMY AND ADENOIDECTOMY  1947     Social History   Tobacco Use   Smoking status: Some Days    Packs/day: 1.00    Years: 59.00    Total pack years: 59.00    Types: Cigarettes    Last attempt to quit: 10/12/2012    Years since quitting: 9.7   Smokeless tobacco: Never  Vaping Use   Vaping Use: Never used  Substance Use Topics   Alcohol use: Not Currently    Comment: rare   Drug use: No  Family History  Problem Relation Age of Onset   Arthritis Mother    Lung cancer Brother        was a smoker   Breast cancer Maternal Grandmother    Heart disease Maternal Grandfather    Asthma Father    Emphysema Father    Heart disease Paternal Grandfather      Allergies  Allergen Reactions   Adhesive [Tape] Rash    Review of Systems: Negative Unless Checked Constitutional: _0 Weight loss  _1 Fever  _2 Chills Cardiac: _3 Chest pain   _4  Atrial Fibrillation  _5 Palpitations   _6 Shortness of breath when laying flat   _7 Shortness of breath with exertion. _8 Shortness of breath at rest Vascular:  _9 Pain in legs with walking   _10 Pain in legs with standing _11 Pain in legs when laying flat   _12 Claudication    _13 Pain in feet when laying flat    _14 History of DVT   _15 Phlebitis   _16 Swelling in legs   _17 Varicose veins   _18 Non-healing ulcers Pulmonary:   _19 Uses home oxygen    _20 Productive cough   _21 Hemoptysis   _22 Wheeze  _23 COPD   _24 Asthma Neurologic:  _25 Dizziness   _26 Seizures  _27 Blackouts _28 History of stroke   _29 History of TIA  _30 Aphasia   _31 Temporary Blindness   _32 Weakness or numbness in arm   _33 Weakness or numbness in leg Musculoskeletal:   _34 Joint swelling   _35 Joint pain   _36 Low back pain  _37  History of Knee Replacement _38 Arthritis _39 back Surgeries  _40  Spinal Stenosis    Hematologic:  _41 Easy bruising  _42 Easy bleeding   _43 Hypercoagulable state   _44 Anemic Gastrointestinal:  _45 Diarrhea   _46 Vomiting  _47 Gastroesophageal reflux/heartburn   _48 Difficulty swallowing. _49 Abdominal pain Genitourinary:  _50 Chronic kidney disease   _51 Difficult urination  _52 Anuric   _53 Blood in urine _54 Frequent urination  _55 Burning with urination   _56 Hematuria Skin:  _57 Rashes   _58 Ulcers _59 Wounds Psychological:  _60 History of anxiety   _61  History of major depression  _62  Memory Difficulties   Physical Examination  BP 109/70 (BP Location: Right Arm)   Pulse 84   Resp 16   Wt 252 lb 6.4 oz (114.5 kg)   BMI 30.72 kg/m  Gen:  WD/WN, NAD Head: [place]/AT, No temporalis wasting. Ear/Nose/Throat: Hearing grossly intact, nares w/o erythema or drainage Eyes: Conjunctiva clear. Sclera non-icteric Neck: Supple.  Trachea midline Pulmonary:  Good air movement, no use of accessory muscles.  Cardiac: RRR, no JVD Vascular:  Vessel Right Left  Radial Palpable Palpable           Musculoskeletal: M/S 5/5 throughout.  No deformity or atrophy. 1-2+ BLE edema.  Marked stasis dermatitis changes present bilaterally.  Several scabs present throughout both lower legs. Neurologic: Sensation grossly intact in extremities.  Symmetrical.  Speech is fluent.  Psychiatric: Judgment intact, Mood & affect appropriate for pt's clinical situation. Dermatologic: No rashes or ulcers noted.  No cellulitis or open wounds.      Labs Recent Results (from the past 2160 hour(s))  Basic metabolic panel     Status: Abnormal    Collection Time: 05/08/22  2:31 PM  Result Value Ref Range   Sodium 140 135 - 145 mmol/L   Potassium 3.9 3.5 - 5.1 mmol/L   Chloride 107 98 - 111 mmol/L   CO2 26 22 - 32 mmol/L   Glucose, Bld 113 (H) 70 - 99 mg/dL    Comment: Glucose reference range applies only to samples taken after fasting for at least 8 hours.   BUN 13 8 -  23 mg/dL   Creatinine, Ser 0.85 0.61 - 1.24 mg/dL   Calcium 9.0 8.9 - 10.3 mg/dL   GFR, Estimated >60 >60 mL/min    Comment: (NOTE) Calculated using the CKD-EPI Creatinine Equation (2021)    Anion gap 7 5 - 15    Comment: Performed at [facility], [address] [place], [place] [address]  NM Myocar Multi W/Spect W/Wall Motion / EF     Status: None   Collection Time: 05/21/22  2:06 PM  Result Value Ref Range   Rest HR 72.0 bpm   Rest BP 108/69 mmHg   Peak HR 85 bpm   Peak BP 127/61 mmHg   Percent HR 61.0 %   ST Depression (mm) 0 mm   Rest Nuclear Isotope Dose 9.8 mCi   Stress Nuclear Isotope Dose 30.9 mCi   SSS 5.0    SRS 3.0    SDS 3.0    TID 1.05    LV sys vol 58.0 mL   LV dias vol 137.0 62 - 150 mL   Nuc Stress EF 58 %  Basic metabolic panel     Status: Abnormal   Collection Time: 06/03/22 12:40 PM  Result Value Ref Range   Sodium 143 135 - 145 mmol/L   Potassium 4.0 3.5 - 5.1 mmol/L   Chloride 110 98 - 111 mmol/L   CO2 25 22 - 32 mmol/L   Glucose, Bld 108 (H) 70 - 99 mg/dL    Comment: Glucose reference range applies only to samples taken after fasting for at least 8 hours.   BUN 13 8 - 23 mg/dL   Creatinine, Ser 1.06 0.61 - 1.24 mg/dL   Calcium 9.0 8.9 - 10.3 mg/dL   GFR, Estimated >60 >60 mL/min    Comment: (NOTE) Calculated using the CKD-EPI Creatinine Equation (2021)    Anion gap 8 5 - 15    Comment: Performed at [facility], [address], [place], [place] [address]  Urine Microalbumin w/creat. ratio     Status: Abnormal   Collection Time: 06/12/22  3:01 PM  Result Value Ref Range   Microalb, Ur 2.3 (H) 0.0  - 1.9 mg/dL   Creatinine,U 75.3 mg/dL   Microalb Creat Ratio 3.1 0.0 - 30.0 mg/g  POCT HgB A1C     Status: None   Collection Time: 06/12/22  5:26 PM  Result Value Ref Range   Hemoglobin A1C 5.3 4.0 - 5.6 %   HbA1c POC (<> result, manual entry)     HbA1c, POC (prediabetic range)     HbA1c, POC (controlled diabetic range)    Comp Met (CMET)     Status: Abnormal   Collection Time: 06/21/22 10:35 AM  Result Value Ref Range   Sodium 142 135 - 145 mEq/L   Potassium 4.3 3.5 - 5.1 mEq/L   Chloride 104 96 - 112 mEq/L   CO2 30 19 - 32 mEq/L   Glucose, Bld 97 70 - 99 mg/dL   BUN 18 6 - 23 mg/dL   Creatinine, Ser 1.19 0.40 - 1.50 mg/dL   Total Bilirubin 0.8 0.2 - 1.2 mg/dL   Alkaline Phosphatase 65 39 - 117 U/L   AST 16 0 - 37 U/L   ALT 19 0 - 53 U/L   Total Protein 7.1 6.0 - 8.3 g/dL   Albumin 4.3 3.5 - 5.2 g/dL   GFR 56.99 (L) >60.00 mL/min    Comment: Calculated using the CKD-EPI Creatinine Equation (2021)  Calcium 9.4 8.4 - 10.5 mg/dL    Radiology No results found.  Assessment/Plan Hyperlipidemia with low HDL lipid control important in reducing the progression of atherosclerotic disease. Continue statin therapy     COPD (chronic obstructive pulmonary disease) with emphysema (HCC) Continue pulmonary medications and aerosols as already ordered, these medications have been reviewed and there are no changes at this time.   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  This is a chronic and longstanding problem is going to take many months and Unna boots to help.  We have placed 3 layer Unna boots today bilaterally and these will be changed weekly.  Patient needs a lymphedema pump as adjuvant therapy in addition to his compression.  Elevation is much as  possible.  Return to clinic in 4 to 6 weeks in follow-up to reevaluate.   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  This is a chronic and longstanding problem is going to take many months and Unna boots to help.  We have placed 3 layer Unna boots today bilaterally and these will be changed weekly.  Patient needs a lymphedema pump as adjuvant therapy in addition to his compression.  Elevation is much as possible.  Return to clinic in 4 to 6 weeks in follow-up to reevaluate.    Leotis Pain, MD  06/25/2022 1:56 PM    This note was created with Dragon medical transcription system.  Any errors from dictation are purely unintentional

## 2022-06-28 ENCOUNTER — Other Ambulatory Visit: Payer: Self-pay

## 2022-06-28 ENCOUNTER — Encounter: Payer: Self-pay | Admitting: Cardiovascular Disease

## 2022-06-28 ENCOUNTER — Encounter
Admission: RE | Disposition: A | Discharge status: Home / Self Care | Payer: Self-pay | Source: Ambulatory Visit | Attending: Cardiovascular Disease

## 2022-06-28 ENCOUNTER — Ambulatory Visit: Payer: Medicare Other | Admitting: Anesthesiology

## 2022-06-28 ENCOUNTER — Ambulatory Visit
Admission: RE | Admit: 2022-06-28 | Discharge: 2022-06-28 | Disposition: A | Discharge status: Home / Self Care | Payer: Medicare Other | Source: Ambulatory Visit | Attending: Cardiovascular Disease | Admitting: Cardiovascular Disease

## 2022-06-28 DIAGNOSIS — T7840XA Allergy, unspecified, initial encounter: Secondary | ICD-10-CM | POA: Diagnosis not present

## 2022-06-28 DIAGNOSIS — M7989 Other specified soft tissue disorders: Secondary | ICD-10-CM | POA: Insufficient documentation

## 2022-06-28 DIAGNOSIS — Z7984 Long term (current) use of oral hypoglycemic drugs: Secondary | ICD-10-CM | POA: Diagnosis not present

## 2022-06-28 DIAGNOSIS — J449 Chronic obstructive pulmonary disease, unspecified: Secondary | ICD-10-CM | POA: Insufficient documentation

## 2022-06-28 DIAGNOSIS — H9190 Unspecified hearing loss, unspecified ear: Secondary | ICD-10-CM | POA: Diagnosis not present

## 2022-06-28 DIAGNOSIS — Z79899 Other long term (current) drug therapy: Secondary | ICD-10-CM | POA: Diagnosis not present

## 2022-06-28 DIAGNOSIS — I447 Left bundle-branch block, unspecified: Secondary | ICD-10-CM | POA: Insufficient documentation

## 2022-06-28 DIAGNOSIS — R0602 Shortness of breath: Secondary | ICD-10-CM

## 2022-06-28 DIAGNOSIS — I4819 Other persistent atrial fibrillation: Secondary | ICD-10-CM | POA: Insufficient documentation

## 2022-06-28 DIAGNOSIS — Z95828 Presence of other vascular implants and grafts: Secondary | ICD-10-CM | POA: Diagnosis not present

## 2022-06-28 DIAGNOSIS — Z7901 Long term (current) use of anticoagulants: Secondary | ICD-10-CM | POA: Insufficient documentation

## 2022-06-28 DIAGNOSIS — I5032 Chronic diastolic (congestive) heart failure: Secondary | ICD-10-CM | POA: Insufficient documentation

## 2022-06-28 DIAGNOSIS — I4891 Unspecified atrial fibrillation: Secondary | ICD-10-CM | POA: Diagnosis not present

## 2022-06-28 DIAGNOSIS — I2510 Atherosclerotic heart disease of native coronary artery without angina pectoris: Secondary | ICD-10-CM | POA: Diagnosis not present

## 2022-06-28 DIAGNOSIS — I499 Cardiac arrhythmia, unspecified: Secondary | ICD-10-CM | POA: Diagnosis not present

## 2022-06-28 HISTORY — PX: CARDIOVERSION: SHX1299

## 2022-06-28 LAB — GLUCOSE, CAPILLARY: Glucose-Capillary: 92 mg/dL (ref 70–99)

## 2022-06-28 SURGERY — CARDIOVERSION
Anesthesia: General

## 2022-06-28 MED ORDER — PROPOFOL 1000 MG/100ML IV EMUL
INTRAVENOUS | Status: AC
  Filled 2022-06-28: qty 100

## 2022-06-28 MED ORDER — EPHEDRINE 5 MG/ML INJ
INTRAVENOUS | Status: AC
  Filled 2022-06-28: qty 5

## 2022-06-28 MED ORDER — SODIUM CHLORIDE 0.9 % IV SOLN: INTRAVENOUS | Status: DC

## 2022-06-28 MED ORDER — PROPOFOL 10 MG/ML IV BOLUS
INTRAVENOUS | Status: DC | PRN
  Administered 2022-06-28: 70 mg via INTRAVENOUS

## 2022-06-28 MED ORDER — PHENYLEPHRINE 80 MCG/ML (10ML) SYRINGE FOR IV PUSH (FOR BLOOD PRESSURE SUPPORT)
PREFILLED_SYRINGE | INTRAVENOUS | Status: AC
  Filled 2022-06-28: qty 10

## 2022-06-28 NOTE — H&P (Signed)
H&P Addendum, pre-cardioversion ° °Patient was seen and evaluated prior to -cardioversion procedure °Symptoms, prior testing details again confirmed with the patient °Patient examined, no significant change from prior exam °Lab work reviewed in detail personally by myself °Patient understands risk and benefit of the procedure,  °The risks (stroke, cardiac arrhythmias rarely resulting in the need for a temporary or permanent pacemaker, skin irritation or burns and complications associated with conscious sedation including aspiration, arrhythmia, respiratory failure and death), benefits (restoration of normal sinus rhythm) and alternatives of a direct current cardioversion were explained in detail °Patient willing to proceed. ° °Signed, °Tim Raizel Wesolowski, MD, Ph.D °[facility]  °

## 2022-06-28 NOTE — Anesthesia Postprocedure Evaluation (Signed)
Anesthesia Post Note  Patient: Seth Bales Sr.  Procedure(s) Performed: CARDIOVERSION  Patient location during evaluation: PACU Anesthesia Type: General Level of consciousness: awake and alert Pain management: pain level controlled Vital Signs Assessment: post-procedure vital signs reviewed and stable Respiratory status: spontaneous breathing, nonlabored ventilation, respiratory function stable and patient connected to nasal cannula oxygen Cardiovascular status: blood pressure returned to baseline and stable Postop Assessment: no apparent nausea or vomiting Anesthetic complications: no   No notable events documented.   Last Vitals:  Vitals:   06/28/22 0749 06/28/22 0800  BP: 116/74 131/83  Pulse: 71 71  Resp: 14 18  Temp:    SpO2: 97% 96%    Last Pain:  Vitals:   06/28/22 0725  TempSrc: Oral  PainSc: 0-No pain                 Ilene Qua

## 2022-06-28 NOTE — CV Procedure (Signed)
Cardioversion procedure note For atrial fibrillation, persistent.  Procedure Details:  Consent: Risks of procedure as well as the alternatives and risks of each were explained to the (patient/caregiver).  Consent for procedure obtained.  Time Out: Verified patient identification, verified procedure, site/side was marked, verified correct patient position, special equipment/implants available, medications/allergies/relevent history reviewed, required imaging and test results available.  Performed  Patient placed on cardiac monitor, pulse oximetry, supplemental oxygen as necessary.   Sedation given: propofol IV, Dr. Oliver Pacer pads placed anterior and posterior chest.   Cardioverted 1 time(s).   Cardioverted at 200 J. Synchronized biphasic Converted to NSR   Evaluation: Findings: Post procedure EKG shows: NSR Complications: None Patient did tolerate procedure well.  Time Spent Directly with the Patient:  45 minutes   Seth Perez, M.D., Ph.D.  

## 2022-06-28 NOTE — Anesthesia Preprocedure Evaluation (Addendum)
Anesthesia Evaluation  Patient identified by MRN, date of birth, ID band Patient awake    Reviewed: Allergy & Precautions, NPO status , Patient's Chart, lab work & pertinent test results  History of Anesthesia Complications Negative for: history of anesthetic complications  Airway Mallampati: II   Neck ROM: Full    Dental  (+) Poor Dentition   Pulmonary COPD, Current Smoker, former smoker (quit 2014),    Pulmonary exam normal breath sounds clear to auscultation       Cardiovascular +CHF (diastolic)  Normal cardiovascular exam+ dysrhythmias (a fib on Eliquis) Atrial Fibrillation  Rhythm:Regular Rate:Normal  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 Significant peripheral edema BLE    Neuro/Psych HOH    GI/Hepatic negative GI ROS, Neg liver ROS,   Endo/Other  diabetes  Renal/GU negative Renal ROS     Musculoskeletal  (+) Arthritis ,   Abdominal   Peds  Hematology negative hematology ROS (+) Hx of PE    Anesthesia Other Findings   Reproductive/Obstetrics                            Anesthesia Physical  Anesthesia Plan  ASA: 3  Anesthesia Plan: General   Post-op Pain Management: Minimal or no pain anticipated   Induction: Intravenous  PONV Risk Score and Plan: 1 and Propofol infusion, TIVA and Treatment may vary due to age or medical condition  Airway Management Planned: Natural Airway and Nasal Cannula  Additional Equipment:   Intra-op Plan:   Post-operative Plan:   Informed Consent: I have reviewed the patients History and Physical, chart, labs and discussed the procedure including the risks, benefits and alternatives for the proposed anesthesia with the patient or authorized representative who has indicated his/her understanding and acceptance.       Plan Discussed with: CRNA  Anesthesia Plan Comments: (LMA/GETA backup discussed.  Patient consented for risks of anesthesia  including but not limited to:  - adverse reactions to medications - damage to eyes, teeth, lips or other oral mucosa - nerve damage due to positioning  - sore throat or hoarseness - damage to heart, brain, nerves, lungs, other parts of body or loss of life  Informed patient about role of CRNA in peri- and intra-operative care.  Patient voiced understanding.)        Anesthesia Quick Evaluation

## 2022-06-28 NOTE — Interval H&P Note (Signed)
History and Physical Interval Note:  06/28/2022 5:47 PM  Seth Bales Sr.  has presented today for surgery, with the diagnosis of Cardioverion   AFib    pt is very hard of hearing and has hearing aids.  The various methods of treatment have been discussed with the patient and family. After consideration of risks, benefits and other options for treatment, the patient has consented to  Procedure(s): CARDIOVERSION (N/A) as a surgical intervention.  The patient's history has been reviewed, patient examined, no change in status, stable for surgery.  I have reviewed the patient's chart and labs.  Questions were answered to the patient's satisfaction.     [name]

## 2022-06-28 NOTE — Transfer of Care (Signed)
Immediate Anesthesia Transfer of Care Note  Patient: Seth Bales Sr.  Procedure(s) Performed: CARDIOVERSION  Patient Location: Special Procedure  Anesthesia Type:General  Level of Consciousness: drowsy and patient cooperative  Airway & Oxygen Therapy: Patient Spontanous Breathing  Post-op Assessment: Report given to RN and Post -op Vital signs reviewed and stable  Post vital signs: Reviewed and stable  Last Vitals:  Vitals Value Taken Time  BP 121/73 06/28/22 0745  Temp    Pulse 70   Resp 15   SpO2 99 % 06/28/22 0746    Last Pain:  Vitals:   06/28/22 0725  TempSrc: Oral  PainSc: 0-No pain         Complications: No notable events documented.

## 2022-07-01 ENCOUNTER — Encounter: Payer: Self-pay | Admitting: Cardiovascular Disease

## 2022-07-03 ENCOUNTER — Encounter (INDEPENDENT_AMBULATORY_CARE_PROVIDER_SITE_OTHER): Payer: Self-pay | Admitting: Nurse Practitioner

## 2022-07-03 ENCOUNTER — Ambulatory Visit (INDEPENDENT_AMBULATORY_CARE_PROVIDER_SITE_OTHER): Payer: Medicare Other | Admitting: Nurse Practitioner

## 2022-07-03 VITALS — BP 124/70 | HR 68 | Resp 16 | Wt 249.8 lb

## 2022-07-03 DIAGNOSIS — I890 Lymphedema, not elsewhere classified: Secondary | ICD-10-CM

## 2022-07-03 NOTE — Progress Notes (Signed)
History of Present Illness  There is no documented history at this time  Assessments & Plan   There are no diagnoses linked to this encounter.    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    Procedure:  3 layer unna wrap was placed Bilateral lower extremity.   Plan:   Follow up in one week.

## 2022-07-07 ENCOUNTER — Encounter (INDEPENDENT_AMBULATORY_CARE_PROVIDER_SITE_OTHER): Payer: Self-pay | Admitting: Nurse Practitioner

## 2022-07-10 ENCOUNTER — Encounter (INDEPENDENT_AMBULATORY_CARE_PROVIDER_SITE_OTHER): Payer: Self-pay

## 2022-07-10 ENCOUNTER — Ambulatory Visit (INDEPENDENT_AMBULATORY_CARE_PROVIDER_SITE_OTHER): Payer: Medicare Other | Admitting: Nurse Practitioner

## 2022-07-10 VITALS — BP 103/70 | HR 76 | Resp 16

## 2022-07-10 DIAGNOSIS — I890 Lymphedema, not elsewhere classified: Secondary | ICD-10-CM

## 2022-07-10 NOTE — Progress Notes (Signed)
History of Present Illness  There is no documented history at this time  Assessments & Plan   There are no diagnoses linked to this encounter.    Additional instructions  Subjective:  Patient presents with venous ulcer of the Bilateral lower extremity.    Procedure:  3 layer unna wrap was placed Bilateral lower extremity.   Plan:   Follow up in one week.  

## 2022-07-11 ENCOUNTER — Ambulatory Visit: Payer: Medicare Other | Admitting: Internal Medicine

## 2022-07-11 NOTE — Progress Notes (Signed)
Cardiology Office Note  Date:  07/12/2022   ID:  Seth BOODY Sr., DOB [birth year], MRN [id number]  PCP:  Seth Mc, MD   Chief Complaint  Patient presents with   Follow-up    Ref by Dr. Quentin Perez for A-Fib evaluation. Patient c/o bilateral LE edema, shortness of breath and chest pain. Medications reviewed by the patient verbally.     HPI:  Seth Colao. is a 81 y.o. male with a hx of  coronary artery calcification,  persistent A-fib status post DCCV on 02/22/2022 on Eliquis,  HFpEF,  recurrent PE status post IVC filter and on anticoagulation,  COPD,  chronic lower extremity swelling,  LBBB  Who presents for follow-up of his atrial fibrillation, chronic lower extremity swelling  A-fib in the ED on 10/24/2021.   on anticoagulation at that time given history of recurrent PE.   Seen by Dr. Quentin Perez in 01/2022,  successful DCCV with a 200 J shock on 02/22/2022.    in follow-up 03/04/22  in NSR.    Echo showed LVEF 45-50%, global HK, mildly dilated RA, mild Aortic root dilation.   05/06/22  in rate controlled afib and sent to EP.   Myoview lexiscan was ordered to evaluate for ischemia for reduced EF.  Myoview Lexiscan showed no ischemia or scar, no significant coronary artery calcification, mild aortic atherosclerosis, overall low risk.    He saw EP 05/29/22 and was in afib at the time.   started on amiodarone and set up for repeat cardioversion.  Repeat cardioversion June 28, 2022   UNNA boots that is changed out by Vascular.   Fall last week, mechanical, sore ribs, skinned his head Walks with a cane x2  Dr. Lucky Perez placed b/l leg wraps recently Not taking lasix, concerned it might push his blood pressure low Leg swelling  down over past month, thinks it is because he is in normal sinus rhythym  EKG personally reviewed by myself on todays visit NSR rate 74 bpm left bundle branch block  PMH:   has a past medical history of Arthritis, Chicken pox, COPD (chronic  obstructive pulmonary disease) ([place]), and Pulmonary emboli ([place]).  PSH:    Past Surgical History:  Procedure Laterality Date   CARDIOVERSION N/A 02/22/2022   Procedure: CARDIOVERSION;  Surgeon: Seth Hampshire, MD;  Location: [facility] ORS;  Service: Cardiovascular;  Laterality: N/A;   CARDIOVERSION N/A 06/28/2022   Procedure: CARDIOVERSION;  Surgeon: Seth Merritts, MD;  Location: [facility] OR[facility];  Service: Cardiovascular;  Laterality: N/A;   CHOLECYSTECTOMY  2008   TONSILLECTOMY AND ADENOIDECTOMY  1947    Current Outpatient Medications  Medication Sig Dispense Refill   ADVAIR DISKUS 250-50 MCG/ACT AEPB Inhale 1 puff into the lungs 2 (two) times daily.     albuterol (VENTOLIN HFA) 108 (90 Base) MCG/ACT inhaler Inhale 2 puffs into the lungs every 6 (six) hours as needed for wheezing. 6.7 g 11   amiodarone (PACERONE) 200 MG tablet Take 200 mg by mouth daily.     apixaban (ELIQUIS) 5 MG TABS tablet Take 1 tablet (5 mg total) by mouth 2 (two) times daily. 60 tablet 0   Cobalamin Combinations (B-12) 1000-400 MCG SUBL Place 1,000 mcg under the tongue daily. 90 tablet 3   dapagliflozin propanediol (FARXIGA) 10 MG TABS tablet Take 1 tablet (10 mg total) by mouth daily before breakfast. 30 tablet 5   INCRUSE ELLIPTA 62.5 MCG/ACT AEPB TAKE 1 PUFF BY MOUTH EVERY DAY 30 each 2  metoprolol succinate (TOPROL-XL) 25 MG 24 hr tablet Take 1 tablet (25 mg total) by mouth in the morning and at bedtime. 180 tablet 3   Potassium 99 MG TABS Take 1 tablet by mouth daily.     simvastatin (ZOCOR) 20 MG tablet TAKE 1 TABLET BY MOUTH EVERYDAY AT BEDTIME 90 tablet 1   telmisartan (MICARDIS) 20 MG tablet Take 1 tablet (20 mg total) by mouth daily. 30 tablet 2   triamcinolone (KENALOG) 0.025 % ointment Apply 1 application  topically as needed.     VITAMIN D, CHOLECALCIFEROL, PO Take 5,000 Units by mouth daily.     No current facility-administered medications for this visit.    Allergies:   Adhesive [tape]   Social  History:  The patient  reports that he has been smoking cigarettes. He has a 59.00 pack-year smoking history. He has never used smokeless tobacco. He reports that he does not currently use alcohol. He reports that he does not use drugs.   Family History:   family history includes Arthritis in his mother; Asthma in his father; Breast cancer in his maternal grandmother; Emphysema in his father; Heart disease in his maternal grandfather and paternal grandfather; Lung cancer in his brother.    Review of Systems: Review of Systems  Constitutional: Negative.   HENT: Negative.    Respiratory: Negative.    Cardiovascular: Negative.   Gastrointestinal: Negative.   Musculoskeletal: Negative.   Neurological: Negative.   Psychiatric/Behavioral: Negative.    All other systems reviewed and are negative.  PHYSICAL EXAM: VS:  BP 98/60 (BP Location: Left Arm, Patient Position: Sitting, Cuff Size: Normal)   Pulse 74   Ht '6\' 3"'$  (1.905 m)   Wt 240 lb 8 oz (109.1 kg)   SpO2 (!) 87%   BMI 30.06 kg/m  , BMI Body mass index is 30.06 kg/m. GEN: Well nourished, well developed, in no acute distress,, in wheelchair HEENT: normal Neck: no JVD, carotid bruits, or masses Cardiac: RRR; no murmurs, rubs, or gallops,no edema  Respiratory:  clear to auscultation bilaterally, normal work of breathing GI: soft, nontender, nondistended, + BS MS: no deformity or atrophy Skin: warm and dry, no rash, bruising on face Neuro:  Strength and sensation are intact Psych: euthymic mood, full affect  Recent Labs: 10/24/2021: B Natriuretic Peptide 98.5 03/12/2022: Hemoglobin 14.2; Platelets 227.0; TSH 1.57 06/21/2022: ALT 19; BUN 18; Creatinine, Ser 1.19; Potassium 4.3; Sodium 142    Lipid Panel Lab Results  Component Value Date   CHOL 127 03/12/2022   HDL 40.40 03/12/2022   LDLCALC 73 03/12/2022   TRIG 66.0 03/12/2022      Wt Readings from Last 3 Encounters:  07/12/22 240 lb 8 oz (109.1 kg)  07/03/22 249 lb 12.8  oz (113.3 kg)  06/28/22 254 lb (115.2 kg)     ASSESSMENT AND PLAN:  Problem List Items Addressed This Visit       Cardiology Problems   Type 2 diabetes mellitus with vascular disease ([place])   Relevant Medications   amiodarone (PACERONE) 200 MG tablet   Atrial fibrillation (HCC) - Primary   Relevant Medications   amiodarone (PACERONE) 200 MG tablet   Other Relevant Orders   EKG 12-Lead   Lower extremity venous stasis   Relevant Medications   amiodarone (PACERONE) 200 MG tablet   Hyperlipidemia with low HDL   Relevant Medications   amiodarone (PACERONE) 200 MG tablet   Other Visit Diagnoses     Chronic systolic heart failure ([place])  Relevant Medications   amiodarone (PACERONE) 200 MG tablet   Other Relevant Orders   EKG 12-Lead   History of DVT (deep vein thrombosis)       Relevant Orders   EKG 12-Lead      Persistent atrial fibrillation Has had 2 cardioversions, Holding now on amiodarone, metoprolol, Eliquis  in normal sinus rhythm noted on EKG today Likely improved hemodynamics, leg swelling improving Has not been taking Lasix  Chronic lower extremity swelling he feels he has improved leg swelling and normal sinus rhythm Not taking Lasix at home, very sparingly  History of DVT, PE On Eliquis 5 twice daily  Hyperlipidemia Simvastatin 20 mg daily  Essential hypertension Blood pressure running low today, given high fall risk we will decrease telmisartan down to 10 mg daily Wife reports he has not had much to eat or drink in the past 2 days Suggested he increase his fluid intake  Hypoxia  saturations high 80s low 90s Likely from recent rib trauma following fall, taking short breaths No respiratory distress noted on clinical exam He has a pulse oximeter at home, recommended he closely monitor his numbers and call us if numbers continues to stay in the high 80s or lower   Total encounter time more than 30 minutes  Greater than 50% was spent in  counseling and coordination of care with the patient    Signed, Esmond Plants, M.D., Ph.D. [place], [place]

## 2022-07-12 ENCOUNTER — Ambulatory Visit: Payer: Medicare Other | Attending: Cardiovascular Disease | Admitting: Cardiovascular Disease

## 2022-07-12 ENCOUNTER — Encounter: Payer: Self-pay | Admitting: Cardiovascular Disease

## 2022-07-12 VITALS — BP 98/60 | HR 74 | Ht 75.0 in | Wt 240.5 lb

## 2022-07-12 DIAGNOSIS — E786 Lipoprotein deficiency: Secondary | ICD-10-CM | POA: Insufficient documentation

## 2022-07-12 DIAGNOSIS — E785 Hyperlipidemia, unspecified: Secondary | ICD-10-CM

## 2022-07-12 DIAGNOSIS — I4819 Other persistent atrial fibrillation: Secondary | ICD-10-CM | POA: Diagnosis not present

## 2022-07-12 DIAGNOSIS — Z86718 Personal history of other venous thrombosis and embolism: Secondary | ICD-10-CM | POA: Insufficient documentation

## 2022-07-12 DIAGNOSIS — I878 Other specified disorders of veins: Secondary | ICD-10-CM | POA: Insufficient documentation

## 2022-07-12 DIAGNOSIS — E1159 Type 2 diabetes mellitus with other circulatory complications: Secondary | ICD-10-CM | POA: Diagnosis not present

## 2022-07-12 DIAGNOSIS — I2584 Coronary atherosclerosis due to calcified coronary lesion: Secondary | ICD-10-CM

## 2022-07-12 DIAGNOSIS — I5022 Chronic systolic (congestive) heart failure: Secondary | ICD-10-CM

## 2022-07-12 DIAGNOSIS — I2510 Atherosclerotic heart disease of native coronary artery without angina pectoris: Secondary | ICD-10-CM

## 2022-07-12 MED ORDER — TELMISARTAN 20 MG PO TABS: 10.0000 mg | ORAL_TABLET | Freq: Every day | ORAL | 2 refills | Status: DC

## 2022-07-12 NOTE — Patient Instructions (Addendum)
Medication Instructions:  - Your physician has recommended you make the following change in your medication:   1) DECREASE micardis (telmisartan) 20 mg: -take 0.5 tablet (10 mg) by mouth once daily    If you need a refill on your cardiac medications before your next appointment, please call your pharmacy.    Lab work: No new labs needed   Testing/Procedures: No new testing needed   Follow-Up: At [facility], you and your health needs are our priority.  As part of our continuing mission to provide you with exceptional heart care, we have created designated Provider Care Teams.  These Care Teams include your primary Cardiologist (physician) and Advanced Practice Providers (APPs -  Physician Assistants and Nurse Practitioners) who all work together to provide you with the care you need, when you need it.  You will need a follow up appointment in 6 months  Providers on your designated Care Team:   Murray Hodgkins, NP Christell Faith, PA-C Cadence Kathlen Mody, [place]  COVID-19 Vaccine Information can be found at: [email or web address] For questions related to vaccine distribution or appointments, please email [email or web address] or call [phone].

## 2022-07-15 ENCOUNTER — Encounter (INDEPENDENT_AMBULATORY_CARE_PROVIDER_SITE_OTHER): Payer: Self-pay | Admitting: Nurse Practitioner

## 2022-07-17 ENCOUNTER — Encounter (INDEPENDENT_AMBULATORY_CARE_PROVIDER_SITE_OTHER): Payer: Self-pay

## 2022-07-17 ENCOUNTER — Ambulatory Visit (INDEPENDENT_AMBULATORY_CARE_PROVIDER_SITE_OTHER): Payer: Medicare Other | Admitting: Nurse Practitioner

## 2022-07-17 ENCOUNTER — Telehealth: Payer: Self-pay | Admitting: Internal Medicine

## 2022-07-17 ENCOUNTER — Other Ambulatory Visit: Payer: Self-pay | Admitting: Internal Medicine

## 2022-07-17 VITALS — BP 114/67 | HR 62 | Resp 16 | Wt 243.8 lb

## 2022-07-17 DIAGNOSIS — I890 Lymphedema, not elsewhere classified: Secondary | ICD-10-CM | POA: Diagnosis not present

## 2022-07-17 NOTE — Progress Notes (Signed)
History of Present Illness  There is no documented history at this time  Assessments & Plan   There are no diagnoses linked to this encounter.    Additional instructions  Subjective:  Patient presents with venous ulcer of the Bilateral lower extremity.    Procedure:  3 layer unna wrap was placed Bilateral lower extremity.   Plan:   Follow up in one week.  

## 2022-07-17 NOTE — Telephone Encounter (Signed)
Copied from [place] [phone]. Topic: Medicare AWV >> Jul 17, 2022 10:40 AM Devoria Glassing wrote: Reason for CRM: Attempted to schedule AWV. Unable to LVM.  Will try at later time.

## 2022-07-21 ENCOUNTER — Encounter (INDEPENDENT_AMBULATORY_CARE_PROVIDER_SITE_OTHER): Payer: Self-pay | Admitting: Nurse Practitioner

## 2022-07-24 ENCOUNTER — Encounter (INDEPENDENT_AMBULATORY_CARE_PROVIDER_SITE_OTHER): Payer: Self-pay | Admitting: Nurse Practitioner

## 2022-07-24 ENCOUNTER — Ambulatory Visit (INDEPENDENT_AMBULATORY_CARE_PROVIDER_SITE_OTHER): Payer: Medicare Other | Admitting: Nurse Practitioner

## 2022-07-24 VITALS — BP 140/76 | HR 60 | Resp 16 | Wt 243.2 lb

## 2022-07-24 DIAGNOSIS — I890 Lymphedema, not elsewhere classified: Secondary | ICD-10-CM

## 2022-07-24 NOTE — Progress Notes (Signed)
History of Present Illness  There is no documented history at this time  Assessments & Plan   There are no diagnoses linked to this encounter.    Additional instructions  Subjective:  Patient presents with venous ulcer of the Bilateral lower extremity.    Procedure:  3 layer unna wrap was placed Bilateral lower extremity.   Plan:   Follow up in one week.  

## 2022-07-28 ENCOUNTER — Encounter (INDEPENDENT_AMBULATORY_CARE_PROVIDER_SITE_OTHER): Payer: Self-pay | Admitting: Nurse Practitioner

## 2022-08-01 ENCOUNTER — Encounter (INDEPENDENT_AMBULATORY_CARE_PROVIDER_SITE_OTHER): Payer: Medicare Other

## 2022-08-05 ENCOUNTER — Ambulatory Visit (INDEPENDENT_AMBULATORY_CARE_PROVIDER_SITE_OTHER): Payer: Medicare Other | Admitting: Nurse Practitioner

## 2022-08-05 VITALS — BP 114/73 | HR 63 | Resp 16 | Ht 76.0 in | Wt 246.0 lb

## 2022-08-05 DIAGNOSIS — I890 Lymphedema, not elsewhere classified: Secondary | ICD-10-CM

## 2022-08-05 NOTE — Progress Notes (Signed)
History of Present Illness  There is no documented history at this time  Assessments & Plan   There are no diagnoses linked to this encounter.    Additional instructions  Subjective:  Patient presents with venous ulcer of the Bilateral lower extremity.    Procedure:  3 layer unna wrap was placed Bilateral lower extremity.   Plan:   Follow up in one week.  

## 2022-08-06 ENCOUNTER — Telehealth: Payer: Self-pay | Admitting: Internal Medicine

## 2022-08-06 NOTE — Telephone Encounter (Signed)
Copied from [place] [id number]. Topic: Medicare AWV >> Aug 06, 2022 11:01 AM Devoria Glassing wrote: Reason for CRM: Attempted to schedule AWV. Unable to LVM.  Will try at later time. Mailbox full

## 2022-08-07 ENCOUNTER — Ambulatory Visit (INDEPENDENT_AMBULATORY_CARE_PROVIDER_SITE_OTHER): Payer: Medicare Other | Admitting: Nurse Practitioner

## 2022-08-13 ENCOUNTER — Encounter (INDEPENDENT_AMBULATORY_CARE_PROVIDER_SITE_OTHER): Payer: Self-pay | Admitting: Vascular Surgery

## 2022-08-13 ENCOUNTER — Ambulatory Visit (INDEPENDENT_AMBULATORY_CARE_PROVIDER_SITE_OTHER): Payer: Medicare Other | Admitting: Vascular Surgery

## 2022-08-13 VITALS — BP 118/69 | HR 64 | Resp 17 | Ht 76.0 in | Wt 244.8 lb

## 2022-08-13 DIAGNOSIS — Z7901 Long term (current) use of anticoagulants: Secondary | ICD-10-CM

## 2022-08-13 DIAGNOSIS — I2584 Coronary atherosclerosis due to calcified coronary lesion: Secondary | ICD-10-CM

## 2022-08-13 DIAGNOSIS — I2510 Atherosclerotic heart disease of native coronary artery without angina pectoris: Secondary | ICD-10-CM | POA: Diagnosis not present

## 2022-08-13 DIAGNOSIS — I2699 Other pulmonary embolism without acute cor pulmonale: Secondary | ICD-10-CM

## 2022-08-13 DIAGNOSIS — F17210 Nicotine dependence, cigarettes, uncomplicated: Secondary | ICD-10-CM | POA: Diagnosis not present

## 2022-08-13 DIAGNOSIS — I87009 Postthrombotic syndrome without complications of unspecified extremity: Secondary | ICD-10-CM

## 2022-08-13 DIAGNOSIS — E1159 Type 2 diabetes mellitus with other circulatory complications: Secondary | ICD-10-CM

## 2022-08-13 DIAGNOSIS — J432 Centrilobular emphysema: Secondary | ICD-10-CM | POA: Diagnosis not present

## 2022-08-13 DIAGNOSIS — I890 Lymphedema, not elsewhere classified: Secondary | ICD-10-CM | POA: Diagnosis not present

## 2022-08-13 NOTE — Progress Notes (Signed)
MRN : [id number]  Seth Perez. is a 81 y.o. (November 09, 1940) male who presents with chief complaint of No chief complaint on file. Marland Kitchen  History of Present Illness: Patient returns today in follow up of his leg swelling and Unna boot's.  His skin is now healed.  His legs remain swollen but they are generally doing better.  They are not hurting nearly as much.  No fevers or chills.  No signs of systemic infection.  Current Outpatient Medications  Medication Sig Dispense Refill   ADVAIR DISKUS 250-50 MCG/ACT AEPB Inhale 1 puff into the lungs 2 (two) times daily.     albuterol (VENTOLIN HFA) 108 (90 Base) MCG/ACT inhaler Inhale 2 puffs into the lungs every 6 (six) hours as needed for wheezing. 6.7 g 11   amiodarone (PACERONE) 200 MG tablet Take 200 mg by mouth daily.     apixaban (ELIQUIS) 5 MG TABS tablet Take 1 tablet (5 mg total) by mouth 2 (two) times daily. 60 tablet 0   Cobalamin Combinations (B-12) 1000-400 MCG SUBL Place 1,000 mcg under the tongue daily. 90 tablet 3   dapagliflozin propanediol (FARXIGA) 10 MG TABS tablet Take 1 tablet (10 mg total) by mouth daily before breakfast. 30 tablet 5   furosemide (LASIX) 20 MG tablet Take 1 tablet (20 mg total) by mouth daily as needed. 30 tablet 3   INCRUSE ELLIPTA 62.5 MCG/ACT AEPB TAKE 1 PUFF BY MOUTH EVERY DAY 30 each 2   metoprolol succinate (TOPROL-XL) 25 MG 24 hr tablet Take 1 tablet (25 mg total) by mouth in the morning and at bedtime. 180 tablet 3   Potassium 99 MG TABS Take 1 tablet by mouth daily.     simvastatin (ZOCOR) 20 MG tablet TAKE 1 TABLET BY MOUTH EVERYDAY AT BEDTIME 90 tablet 1   telmisartan (MICARDIS) 20 MG tablet Take 0.5 tablets (10 mg total) by mouth daily. 30 tablet 2   triamcinolone (KENALOG) 0.025 % ointment Apply 1 application  topically as needed.     VITAMIN D, CHOLECALCIFEROL, PO Take 5,000 Units by mouth daily.     No current facility-administered medications for this visit.    Past Medical History:   Diagnosis Date   Arthritis    Chicken pox    COPD (chronic obstructive pulmonary disease) ([place])    Pulmonary emboli ([place])    on Xarelto    Past Surgical History:  Procedure Laterality Date   CARDIOVERSION N/A 02/22/2022   Procedure: CARDIOVERSION;  Surgeon: Wellington Hampshire, MD;  Location: [place] [facility];  Service: Cardiovascular;  Laterality: N/A;   CARDIOVERSION N/A 06/28/2022   Procedure: CARDIOVERSION;  Surgeon: Minna Merritts, MD;  Location: [facility];  Service: Cardiovascular;  Laterality: N/A;   CHOLECYSTECTOMY  2008   TONSILLECTOMY AND ADENOIDECTOMY  1947     Social History   Tobacco Use   Smoking status: Some Days    Packs/day: 1.00    Years: 59.00    Total pack years: 59.00    Types: Cigarettes    Last attempt to quit: 10/12/2012    Years since quitting: 9.8   Smokeless tobacco: Never  Vaping Use   Vaping Use: Never used  Substance Use Topics   Alcohol use: Not Currently    Comment: rare   Drug use: No      Family History  Problem Relation Age of Onset   Arthritis Mother    Lung cancer Brother        was a  smoker   Breast cancer Maternal Grandmother    Heart disease Maternal Grandfather    Asthma Father    Emphysema Father    Heart disease Paternal Grandfather     Allergies  Allergen Reactions   Adhesive [Tape] Rash      Review of Systems: Negative Unless Checked Constitutional: _0 Weight loss  _1 Fever  _2 Chills Cardiac: _3 Chest pain   _4  Atrial Fibrillation  _5 Palpitations   _6 Shortness of breath when laying flat   _7 Shortness of breath with exertion. _8 Shortness of breath at rest Vascular:  _9 Pain in legs with walking   _10 Pain in legs with standing _11 Pain in legs when laying flat   _12 Claudication    _13 Pain in feet when laying flat    _14 History of DVT   _15 Phlebitis   _16 Swelling in legs   _17 Varicose veins   _18 Non-healing ulcers Pulmonary:   _19 Uses home oxygen   _20 Productive cough   _21 Hemoptysis   _22 Wheeze  _23 COPD   _24 Asthma Neurologic:   _25 Dizziness   _26 Seizures  _27 Blackouts _28 History of stroke   _29 History of TIA  _30 Aphasia   _31 Temporary Blindness   _32 Weakness or numbness in arm   _33 Weakness or numbness in leg Musculoskeletal:   _34 Joint swelling   _35 Joint pain   _36 Low back pain  _37  History of Knee Replacement _38 Arthritis _39 back Surgeries  _40  Spinal Stenosis    Hematologic:  _41 Easy bruising  _42 Easy bleeding   _43 Hypercoagulable state   _44 Anemic Gastrointestinal:  _45 Diarrhea   _46 Vomiting  _47 Gastroesophageal reflux/heartburn   _48 Difficulty swallowing. _49 Abdominal pain Genitourinary:  _50 Chronic kidney disease   _51 Difficult urination  _52 Anuric   _53 Blood in urine _54 Frequent urination  _55 Burning with urination   _56 Hematuria Skin:  _57 Rashes   _58 Ulcers _59 Wounds Psychological:  _60 History of anxiety   _61  History of major depression  _62  Memory Difficulties    Physical Examination  BP 118/69 (BP Location: Right Arm)   Pulse 64   Resp 17   Ht _63  (1.93 m)   Wt 244 lb 12.8 oz (111 kg)   BMI 29.80 kg/m  Gen:  WD/WN, NAD Head: [place]/AT, No temporalis wasting. Ear/Nose/Throat: Hearing grossly intact, nares w/o erythema or drainage Eyes: Conjunctiva clear. Sclera non-icteric Neck: Supple.  Trachea midline Pulmonary:  Good air movement, no use of accessory muscles.  Cardiac: Irregular Vascular:  Vessel Right Left  Radial Palpable Palpable               Musculoskeletal: M/S 5/5 throughout.  No deformity or atrophy.  Marked stasis dermatitis changes.  1+ right lower extremity edema, 1-2+ left lower extremity edema. Neurologic: Sensation grossly intact in extremities.  Symmetrical.  Speech is fluent.  Psychiatric: Judgment intact, Mood & affect appropriate for pt's clinical situation. Dermatologic: No rashes or ulcers noted.  No cellulitis or open wounds.      Labs Recent Results (from the past 2160 hour(s))  NM Myocar Multi W/Spect W/Wall Motion / EF     Status: None   Collection Time: 05/21/22  2:06 PM  Result Value Ref Range    Rest HR 72.0 bpm   Rest BP 108/69 mmHg   Peak HR 85 bpm   Peak BP 127/61 mmHg   Percent HR 61.0 %   ST Depression (mm) 0 mm   Rest Nuclear Isotope Dose 9.8 mCi   Stress Nuclear Isotope Dose 30.9 mCi   SSS 5.0    SRS 3.0    SDS 3.0    TID 1.05    LV sys vol 58.0 mL  LV dias vol 137.0 62 - 150 mL   Nuc Stress EF 58 %  Basic metabolic panel     Status: Abnormal   Collection Time: 06/03/22 12:40 PM  Result Value Ref Range   Sodium 143 135 - 145 mmol/L   Potassium 4.0 3.5 - 5.1 mmol/L   Chloride 110 98 - 111 mmol/L   CO2 25 22 - 32 mmol/L   Glucose, Bld 108 (H) 70 - 99 mg/dL    Comment: Glucose reference range applies only to samples taken after fasting for at least 8 hours.   BUN 13 8 - 23 mg/dL   Creatinine, Ser 1.06 0.61 - 1.24 mg/dL   Calcium 9.0 8.9 - 10.3 mg/dL   GFR, Estimated >60 >60 mL/min    Comment: (NOTE) Calculated using the CKD-EPI Creatinine Equation (2021)    Anion gap 8 5 - 15    Comment: Performed at [facility], [address], [place], [place] [address]  Urine Microalbumin w/creat. ratio     Status: Abnormal   Collection Time: 06/12/22  3:01 PM  Result Value Ref Range   Microalb, Ur 2.3 (H) 0.0 - 1.9 mg/dL   Creatinine,U 75.3 mg/dL   Microalb Creat Ratio 3.1 0.0 - 30.0 mg/g  POCT HgB A1C     Status: None   Collection Time: 06/12/22  5:26 PM  Result Value Ref Range   Hemoglobin A1C 5.3 4.0 - 5.6 %   HbA1c POC (<> result, manual entry)     HbA1c, POC (prediabetic range)     HbA1c, POC (controlled diabetic range)    Comp Met (CMET)     Status: Abnormal   Collection Time: 06/21/22 10:35 AM  Result Value Ref Range   Sodium 142 135 - 145 mEq/L   Potassium 4.3 3.5 - 5.1 mEq/L   Chloride 104 96 - 112 mEq/L   CO2 30 19 - 32 mEq/L   Glucose, Bld 97 70 - 99 mg/dL   BUN 18 6 - 23 mg/dL   Creatinine, Ser 1.19 0.40 - 1.50 mg/dL   Total Bilirubin 0.8 0.2 - 1.2 mg/dL   Alkaline Phosphatase 65 39 - 117 U/L   AST 16 0 - 37 U/L   ALT 19 0 - 53 U/L    Total Protein 7.1 6.0 - 8.3 g/dL   Albumin 4.3 3.5 - 5.2 g/dL   GFR 56.99 (L) >60.00 mL/min    Comment: Calculated using the CKD-EPI Creatinine Equation (2021)   Calcium 9.4 8.4 - 10.5 mg/dL  Glucose, capillary     Status: None   Collection Time: 06/28/22  7:27 AM  Result Value Ref Range   Glucose-Capillary 92 70 - 99 mg/dL    Comment: Glucose reference range applies only to samples taken after fasting for at least 8 hours.    Radiology No results found.  Assessment/Plan Hyperlipidemia with low HDL lipid control important in reducing the progression of atherosclerotic disease. Continue statin therapy     COPD (chronic obstructive pulmonary disease) with emphysema (HCC) Continue pulmonary medications and aerosols as already ordered, these medications have been reviewed and there are no changes at this time.   Recurrent pulmonary embolism ([place]) Remains on anticoagulation with filter in place.  Type 2 diabetes mellitus with vascular disease (HCC) blood glucose control important in reducing the progression of atherosclerotic disease. Also, involved in wound healing. On appropriate medications.   Lymphedema He is doing well and very close to coming out of the [employer].  We are going to do 1 more week and an Haematologist particularly given the holiday week this week.  If he were to begin weeping later in the week, we would have no option but to wait until next week to rewrap him.  He can come out of the Unna boots next week and I will see him back in about 4 to 6 weeks.    Leotis Pain, MD  08/13/2022 2:58 PM    This note was created with Dragon medical transcription system.  Any errors from dictation are purely unintentional

## 2022-08-13 NOTE — Assessment & Plan Note (Signed)
He is doing well and very close to coming out of the [employer].  We are going to do 1 more week and an Haematologist particularly given the holiday week this week.  If he were to begin weeping later in the week, we would have no option but to wait until next week to rewrap him.  He can come out of the Unna boots next week and I will see him back in about 4 to 6 weeks.

## 2022-08-13 NOTE — Assessment & Plan Note (Signed)
blood glucose control important in reducing the progression of atherosclerotic disease. Also, involved in wound healing. On appropriate medications.  

## 2022-08-16 ENCOUNTER — Other Ambulatory Visit: Payer: Self-pay | Admitting: Internal Medicine

## 2022-08-18 ENCOUNTER — Encounter (INDEPENDENT_AMBULATORY_CARE_PROVIDER_SITE_OTHER): Payer: Self-pay | Admitting: Nurse Practitioner

## 2022-08-20 ENCOUNTER — Encounter (INDEPENDENT_AMBULATORY_CARE_PROVIDER_SITE_OTHER): Payer: Self-pay

## 2022-08-20 ENCOUNTER — Ambulatory Visit (INDEPENDENT_AMBULATORY_CARE_PROVIDER_SITE_OTHER): Payer: Medicare Other | Admitting: Nurse Practitioner

## 2022-08-20 VITALS — BP 131/76 | HR 58 | Resp 17 | Ht 76.0 in | Wt 250.8 lb

## 2022-08-20 DIAGNOSIS — I890 Lymphedema, not elsewhere classified: Secondary | ICD-10-CM | POA: Diagnosis not present

## 2022-08-20 NOTE — Progress Notes (Signed)
History of Present Illness  There is no documented history at this time  Assessments & Plan   There are no diagnoses linked to this encounter.    Additional instructions  Subjective:  Patient presents with venous ulcer of the Bilateral lower extremity.    Procedure:  3 layer unna wrap was placed Bilateral lower extremity.   Plan:   Follow up in one week.  

## 2022-08-25 ENCOUNTER — Encounter (INDEPENDENT_AMBULATORY_CARE_PROVIDER_SITE_OTHER): Payer: Self-pay | Admitting: Nurse Practitioner

## 2022-08-27 ENCOUNTER — Encounter (INDEPENDENT_AMBULATORY_CARE_PROVIDER_SITE_OTHER): Payer: Self-pay | Admitting: Vascular Surgery

## 2022-08-27 ENCOUNTER — Ambulatory Visit (INDEPENDENT_AMBULATORY_CARE_PROVIDER_SITE_OTHER): Payer: Medicare Other | Admitting: Vascular Surgery

## 2022-08-27 VITALS — BP 125/78 | HR 68 | Resp 16 | Wt 245.0 lb

## 2022-08-27 DIAGNOSIS — I2699 Other pulmonary embolism without acute cor pulmonale: Secondary | ICD-10-CM

## 2022-08-27 DIAGNOSIS — I890 Lymphedema, not elsewhere classified: Secondary | ICD-10-CM | POA: Diagnosis not present

## 2022-08-27 DIAGNOSIS — I87009 Postthrombotic syndrome without complications of unspecified extremity: Secondary | ICD-10-CM | POA: Diagnosis not present

## 2022-08-27 DIAGNOSIS — J432 Centrilobular emphysema: Secondary | ICD-10-CM | POA: Diagnosis not present

## 2022-08-27 DIAGNOSIS — I2510 Atherosclerotic heart disease of native coronary artery without angina pectoris: Secondary | ICD-10-CM

## 2022-08-27 DIAGNOSIS — E1159 Type 2 diabetes mellitus with other circulatory complications: Secondary | ICD-10-CM | POA: Diagnosis not present

## 2022-08-27 DIAGNOSIS — I2584 Coronary atherosclerosis due to calcified coronary lesion: Secondary | ICD-10-CM | POA: Diagnosis not present

## 2022-08-27 NOTE — Progress Notes (Signed)
MRN : [id number]  Seth GUTHMILLER Sr. is a 81 y.o. ([birth year]) male who presents with chief complaint of  Chief Complaint  Patient presents with   Follow-up    Unna boot check  .  History of Present Illness: Patient returns today in follow up of his leg swelling and ulceration.  He has been in [employer] for many months.  His skin is now healed and has remained intact for several weeks.  His swelling is under good control with Unna boots and we are here today to discuss removal of Unna boots and going to compression socks.  Current Outpatient Medications  Medication Sig Dispense Refill   ADVAIR DISKUS 250-50 MCG/ACT AEPB Inhale 1 puff into the lungs 2 (two) times daily.     albuterol (VENTOLIN HFA) 108 (90 Base) MCG/ACT inhaler Inhale 2 puffs into the lungs every 6 (six) hours as needed for wheezing. 6.7 g 11   amiodarone (PACERONE) 200 MG tablet Take 200 mg by mouth daily.     apixaban (ELIQUIS) 5 MG TABS tablet Take 1 tablet (5 mg total) by mouth 2 (two) times daily. 60 tablet 0   Cobalamin Combinations (B-12) 1000-400 MCG SUBL Place 1,000 mcg under the tongue daily. 90 tablet 3   dapagliflozin propanediol (FARXIGA) 10 MG TABS tablet Take 1 tablet (10 mg total) by mouth daily before breakfast. 30 tablet 5   furosemide (LASIX) 20 MG tablet Take 1 tablet (20 mg total) by mouth daily as needed. 30 tablet 3   INCRUSE ELLIPTA 62.5 MCG/ACT AEPB TAKE 1 PUFF BY MOUTH EVERY DAY 30 each 2   metoprolol succinate (TOPROL-XL) 25 MG 24 hr tablet Take 1 tablet (25 mg total) by mouth in the morning and at bedtime. 180 tablet 3   Potassium 99 MG TABS Take 1 tablet by mouth daily.     simvastatin (ZOCOR) 20 MG tablet TAKE 1 TABLET BY MOUTH EVERYDAY AT BEDTIME 90 tablet 1   telmisartan (MICARDIS) 20 MG tablet Take 0.5 tablets (10 mg total) by mouth daily. 30 tablet 2   triamcinolone (KENALOG) 0.025 % ointment Apply 1 application  topically as needed.     VITAMIN D, CHOLECALCIFEROL, PO Take 5,000 Units  by mouth daily.     No current facility-administered medications for this visit.    Past Medical History:  Diagnosis Date   Arthritis    Chicken pox    COPD (chronic obstructive pulmonary disease) ([place])    Pulmonary emboli ([place])    on Xarelto    Past Surgical History:  Procedure Laterality Date   CARDIOVERSION N/A 02/22/2022   Procedure: CARDIOVERSION;  Surgeon: Wellington Hampshire, MD;  Location: [facility];  Service: Cardiovascular;  Laterality: N/A;   CARDIOVERSION N/A 06/28/2022   Procedure: CARDIOVERSION;  Surgeon: Minna Merritts, MD;  Location: [facility];  Service: Cardiovascular;  Laterality: N/A;   CHOLECYSTECTOMY  2008   TONSILLECTOMY AND ADENOIDECTOMY  1947     Social History   Tobacco Use   Smoking status: Some Days    Packs/day: 1.00    Years: 59.00    Total pack years: 59.00    Types: Cigarettes    Last attempt to quit: 10/12/2012    Years since quitting: 9.8   Smokeless tobacco: Never  Vaping Use   Vaping Use: Never used  Substance Use Topics   Alcohol use: Not Currently    Comment: rare   Drug use: No      Family History  Problem Relation Age of Onset   Arthritis Mother    Lung cancer Brother        was a smoker   Breast cancer Maternal Grandmother    Heart disease Maternal Grandfather    Asthma Father    Emphysema Father    Heart disease Paternal Grandfather      Allergies  Allergen Reactions   Adhesive [Tape] Rash     Review of Systems: Negative Unless Checked Constitutional: _0 Weight loss  _1 Fever  _2 Chills Cardiac: _3 Chest pain   _4  Atrial Fibrillation  _5 Palpitations   _6 Shortness of breath when laying flat   _7 Shortness of breath with exertion. _8 Shortness of breath at rest Vascular:  _9 Pain in legs with walking   _10 Pain in legs with standing _11 Pain in legs when laying flat   _12 Claudication    _13 Pain in feet when laying flat    _14 History of DVT   _15 Phlebitis   _16 Swelling in legs   _17 Varicose veins   _18 Non-healing ulcers Pulmonary:    _19 Uses home oxygen   _20 Productive cough   _21 Hemoptysis   _22 Wheeze  _23 COPD   _24 Asthma Neurologic:  _25 Dizziness   _26 Seizures  _27 Blackouts _28 History of stroke   _29 History of TIA  _30 Aphasia   _31 Temporary Blindness   _32 Weakness or numbness in arm   _33 Weakness or numbness in leg Musculoskeletal:   _34 Joint swelling   _35 Joint pain   _36 Low back pain  _37  History of Knee Replacement _38 Arthritis _39 back Surgeries  _40  Spinal Stenosis    Hematologic:  _41 Easy bruising  _42 Easy bleeding   _43 Hypercoagulable state   _44 Anemic Gastrointestinal:  _45 Diarrhea   _46 Vomiting  _47 Gastroesophageal reflux/heartburn   _48 Difficulty swallowing. _49 Abdominal pain Genitourinary:  _50 Chronic kidney disease   _51 Difficult urination  _52 Anuric   _53 Blood in urine _54 Frequent urination  _55 Burning with urination   _56 Hematuria Skin:  _57 Rashes   _58 Ulcers _59 Wounds Psychological:  _60 History of anxiety   _61  History of major depression  _62  Memory Difficulties   Physical Examination  BP 125/78 (BP Location: Right Arm)   Pulse 68   Resp 16   Wt 245 lb (111.1 kg)   BMI 29.82 kg/m  Gen:  WD/WN, NAD Head: [place]/AT, No temporalis wasting. Ear/Nose/Throat: Hearing grossly intact, nares w/o erythema or drainage Eyes: Conjunctiva clear. Sclera non-icteric Neck: Supple.  Trachea midline Pulmonary:  Good air movement, no use of accessory muscles.  Cardiac: irregular Vascular:  Vessel Right Left  Radial Palpable Palpable           Musculoskeletal: M/S 5/5 throughout.  No deformity or atrophy. 1+ RLE edema, 1-2+ LLE edema.  Marked stasis dermatitis changes are present bilaterally Neurologic: Sensation grossly intact in extremities.  Symmetrical.  Speech is fluent.  Psychiatric: Judgment intact, Mood & affect appropriate for pt's clinical situation. Dermatologic: No rashes or ulcers noted.  No cellulitis or open wounds.      Labs Recent Results (from the past 2160 hour(s))  Basic metabolic panel     Status: Abnormal   Collection Time:  06/03/22 12:40 PM  Result Value Ref Range   Sodium 143 135 - 145 mmol/L   Potassium 4.0 3.5 - 5.1 mmol/L   Chloride 110 98 - 111 mmol/L   CO2 25 22 - 32 mmol/L   Glucose, Bld 108 (H) 70 - 99 mg/dL    Comment: Glucose reference range applies only to samples taken after fasting for at least 8 hours.   BUN 13 8 - 23 mg/dL   Creatinine, Ser 1.06 0.61 - 1.24 mg/dL  Calcium 9.0 8.9 - 10.3 mg/dL   GFR, Estimated >60 >60 mL/min    Comment: (NOTE) Calculated using the CKD-EPI Creatinine Equation (2021)    Anion gap 8 5 - 15    Comment: Performed at [facility], [address], [place], [place] [address]  Urine Microalbumin w/creat. ratio     Status: Abnormal   Collection Time: 06/12/22  3:01 PM  Result Value Ref Range   Microalb, Ur 2.3 (H) 0.0 - 1.9 mg/dL   Creatinine,U 75.3 mg/dL   Microalb Creat Ratio 3.1 0.0 - 30.0 mg/g  POCT HgB A1C     Status: None   Collection Time: 06/12/22  5:26 PM  Result Value Ref Range   Hemoglobin A1C 5.3 4.0 - 5.6 %   HbA1c POC (<> result, manual entry)     HbA1c, POC (prediabetic range)     HbA1c, POC (controlled diabetic range)    Comp Met (CMET)     Status: Abnormal   Collection Time: 06/21/22 10:35 AM  Result Value Ref Range   Sodium 142 135 - 145 mEq/L   Potassium 4.3 3.5 - 5.1 mEq/L   Chloride 104 96 - 112 mEq/L   CO2 30 19 - 32 mEq/L   Glucose, Bld 97 70 - 99 mg/dL   BUN 18 6 - 23 mg/dL   Creatinine, Ser 1.19 0.40 - 1.50 mg/dL   Total Bilirubin 0.8 0.2 - 1.2 mg/dL   Alkaline Phosphatase 65 39 - 117 U/L   AST 16 0 - 37 U/L   ALT 19 0 - 53 U/L   Total Protein 7.1 6.0 - 8.3 g/dL   Albumin 4.3 3.5 - 5.2 g/dL   GFR 56.99 (L) >60.00 mL/min    Comment: Calculated using the CKD-EPI Creatinine Equation (2021)   Calcium 9.4 8.4 - 10.5 mg/dL  Glucose, capillary     Status: None   Collection Time: 06/28/22  7:27 AM  Result Value Ref Range   Glucose-Capillary 92 70 - 99 mg/dL    Comment: Glucose reference range applies only to samples  taken after fasting for at least 8 hours.    Radiology No results found.  Assessment/Plan Hyperlipidemia with low HDL lipid control important in reducing the progression of atherosclerotic disease. Continue statin therapy     COPD (chronic obstructive pulmonary disease) with emphysema (HCC) Continue pulmonary medications and aerosols as already ordered, these medications have been reviewed and there are no changes at this time.   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   Type 2 diabetes mellitus with vascular disease (HCC) blood glucose control important in reducing the progression of atherosclerotic disease. Also, involved in wound healing. On appropriate medications.  Lymphedema Skin remains intact with no recurrent ulceration.  Swelling is under reasonable control.  At this point, we will going to come out of the Unna boots and go to just compression socks.  Will see him back in a couple of months.    Leotis Pain, MD  08/27/2022 1:54 PM    This note was created with Dragon medical transcription system.  Any errors from dictation are purely unintentional

## 2022-08-27 NOTE — Assessment & Plan Note (Signed)
Skin remains intact with no recurrent ulceration.  Swelling is under reasonable control.  At this point, we will going to come out of the Unna boots and go to just compression socks.  Will see him back in a couple of months.

## 2022-08-28 ENCOUNTER — Ambulatory Visit: Payer: Medicare Other | Attending: Cardiology | Admitting: Cardiology

## 2022-08-28 ENCOUNTER — Encounter: Payer: Self-pay | Admitting: Cardiology

## 2022-08-28 ENCOUNTER — Other Ambulatory Visit
Admission: RE | Admit: 2022-08-28 | Discharge: 2022-08-28 | Disposition: A | Discharge status: Home / Self Care | Payer: Medicare Other | Attending: Cardiology | Admitting: Cardiology

## 2022-08-28 VITALS — BP 144/70 | HR 72 | Ht 76.0 in | Wt 246.0 lb

## 2022-08-28 DIAGNOSIS — Z79899 Other long term (current) drug therapy: Secondary | ICD-10-CM | POA: Insufficient documentation

## 2022-08-28 DIAGNOSIS — I5022 Chronic systolic (congestive) heart failure: Secondary | ICD-10-CM | POA: Insufficient documentation

## 2022-08-28 DIAGNOSIS — I4819 Other persistent atrial fibrillation: Secondary | ICD-10-CM | POA: Diagnosis not present

## 2022-08-28 DIAGNOSIS — Z86718 Personal history of other venous thrombosis and embolism: Secondary | ICD-10-CM | POA: Diagnosis not present

## 2022-08-28 LAB — CBC
HCT: 46 % (ref 39.0–52.0)
Hemoglobin: 15.2 g/dL (ref 13.0–17.0)
MCH: 31.1 pg (ref 26.0–34.0)
MCHC: 33 g/dL (ref 30.0–36.0)
MCV: 94.1 fL (ref 80.0–100.0)
Platelets: 160 10*3/uL (ref 150–400)
RBC: 4.89 MIL/uL (ref 4.22–5.81)
RDW: 13.1 % (ref 11.5–15.5)
WBC: 8.3 10*3/uL (ref 4.0–10.5)
nRBC: 0 % (ref 0.0–0.2)

## 2022-08-28 LAB — TSH: TSH: 1.926 u[IU]/mL (ref 0.350–4.500)

## 2022-08-28 LAB — HEPATIC FUNCTION PANEL
ALT: 23 U/L (ref 0–44)
AST: 22 U/L (ref 15–41)
Albumin: 4.5 g/dL (ref 3.5–5.0)
Alkaline Phosphatase: 66 U/L (ref 38–126)
Bilirubin, Direct: 0.2 mg/dL (ref 0.0–0.2)
Indirect Bilirubin: 0.9 mg/dL (ref 0.3–0.9)
Total Bilirubin: 1.1 mg/dL (ref 0.3–1.2)
Total Protein: 8 g/dL (ref 6.5–8.1)

## 2022-08-28 NOTE — Patient Instructions (Signed)
Medication Instructions:  Your physician recommends that you continue on your current medications as directed. Please refer to the Current Medication list given to you today.  *If you need a refill on your cardiac medications before your next appointment, please call your pharmacy*   Lab Work: Today: TSH, LFTs & CBC If you have labs (blood work) drawn today and your tests are completely normal, you will receive your results only by: [place] (if you have MyChart) OR A paper copy in the mail If you have any lab test that is abnormal or we need to change your treatment, we will call you to review the results.   Testing/Procedures: None ordered   Follow-Up: At [facility], you and your health needs are our priority.  As part of our continuing mission to provide you with exceptional heart care, we have created designated Provider Care Teams.  These Care Teams include your primary Cardiologist (physician) and Advanced Practice Providers (APPs -  Physician Assistants and Nurse Practitioners) who all work together to provide you with the care you need, when you need it.  We recommend signing up for the patient portal called "MyChart".  Sign up information is provided on this After Visit Summary.  MyChart is used to connect with patients for Virtual Visits (Telemedicine).  Patients are able to view lab/test results, encounter notes, upcoming appointments, etc.  Non-urgent messages can be sent to your provider as well.   To learn more about what you can do with MyChart, go to [email or web address].    Your next appointment:   6 month(s)  The format for your next appointment:   In Person  Provider:   Mamie Levers, NP     Thank you for choosing [facility]!!   [phone]  Other Instructions    Important Information About Sugar

## 2022-08-28 NOTE — Progress Notes (Signed)
Electrophysiology Office Follow up Visit Note:    Date:  08/28/2022   ID:  Seth Bales Sr., DOB 03-18-1941, MRN [id number]  PCP:  [name], MD  [facility] Cardiologist:  None  [facility] Electrophysiologist:  Vickie Epley, MD    Interval History:    Seth Bales Sr. is a 81 y.o. male who presents for a follow up visit. They were last seen in clinic 05/29/2022 for persistent AF. Amiodarone was started at that visit and DCCV was planned. He underwent DCCV 06/28/2022. He saw TG in follow up 07/12/2022 and was maintaining normal rhythm with improved symptoms.   Today, he remains in SR. He was un-aware that he was in AF, but in hindsight does feel better now that he is in sinus.  He wore ace bandages daily, but they were removed yesterday by Dr. Lucky Cowboy (vasc sx) provider. He reapplied them today but has developed a blister on the R foot.   He is diligent about taking medications. No missed doses. Does have some increased bruising with eliquis, but no other concerns.      Past Medical History:  Diagnosis Date   Arthritis    Chicken pox    COPD (chronic obstructive pulmonary disease) ([place])    Pulmonary emboli ([place])    on Xarelto    Past Surgical History:  Procedure Laterality Date   CARDIOVERSION N/A 02/22/2022   Procedure: CARDIOVERSION;  Surgeon: Wellington Hampshire, MD;  Location: [facility];  Service: Cardiovascular;  Laterality: N/A;   CARDIOVERSION N/A 06/28/2022   Procedure: CARDIOVERSION;  Surgeon: Minna Merritts, MD;  Location: [facility];  Service: Cardiovascular;  Laterality: N/A;   CHOLECYSTECTOMY  2008   TONSILLECTOMY AND ADENOIDECTOMY  1947    Current Medications: Current Meds  Medication Sig   ADVAIR DISKUS 250-50 MCG/ACT AEPB Inhale 1 puff into the lungs 2 (two) times daily.   albuterol (VENTOLIN HFA) 108 (90 Base) MCG/ACT inhaler Inhale 2 puffs into the lungs every 6 (six) hours as needed for wheezing.   amiodarone (PACERONE) 200 MG tablet  Take 200 mg by mouth daily.   apixaban (ELIQUIS) 5 MG TABS tablet Take 1 tablet (5 mg total) by mouth 2 (two) times daily.   Cobalamin Combinations (B-12) 1000-400 MCG SUBL Place 1,000 mcg under the tongue daily.   dapagliflozin propanediol (FARXIGA) 10 MG TABS tablet Take 1 tablet (10 mg total) by mouth daily before breakfast.   furosemide (LASIX) 20 MG tablet Take 1 tablet (20 mg total) by mouth daily as needed.   INCRUSE ELLIPTA 62.5 MCG/ACT AEPB TAKE 1 PUFF BY MOUTH EVERY DAY   metoprolol succinate (TOPROL-XL) 25 MG 24 hr tablet Take 1 tablet (25 mg total) by mouth in the morning and at bedtime.   Potassium 99 MG TABS Take 1 tablet by mouth daily.   simvastatin (ZOCOR) 20 MG tablet TAKE 1 TABLET BY MOUTH EVERYDAY AT BEDTIME   telmisartan (MICARDIS) 20 MG tablet Take 0.5 tablets (10 mg total) by mouth daily.   triamcinolone (KENALOG) 0.025 % ointment Apply 1 application  topically as needed.   VITAMIN D, CHOLECALCIFEROL, PO Take 5,000 Units by mouth daily.     Allergies:   Adhesive [tape]   Social History   Socioeconomic History   Marital status: Divorced    Spouse name: Not on file   Number of children: Not on file   Years of education: Not on file   Highest education level: Not on file  Occupational  History   Not on file  Tobacco Use   Smoking status: Some Days    Packs/day: 1.00    Years: 59.00    Total pack years: 59.00    Types: Cigarettes    Last attempt to quit: 10/12/2012    Years since quitting: 9.8   Smokeless tobacco: Never  Vaping Use   Vaping Use: Never used  Substance and Sexual Activity   Alcohol use: Not Currently    Comment: rare   Drug use: No   Sexual activity: Not Currently  Other Topics Concern   Not on file  Social History Narrative   Not on file   Social Determinants of Health   Financial Resource Strain: Medium Risk (11/14/2021)   Overall Financial Resource Strain (CARDIA)    Difficulty of Paying Living Expenses: Somewhat hard  Food  Insecurity: Unknown (09/19/2017)   Hunger Vital Sign    Worried About Running Out of Food in the Last Year: Patient refused    [place] in the Last Year: Patient refused  Transportation Needs: No Transportation Needs (08/22/2020)   PRAPARE - Hydrologist (Medical): No    Lack of Transportation (Non-Medical): No  Physical Activity: Unknown (08/22/2020)   Exercise Vital Sign    Days of Exercise per Week: 0 days    Minutes of Exercise per Session: Not on file  Stress: No Stress Concern Present (08/22/2020)   [place]    Feeling of Stress : Not at all  Social Connections: Unknown (08/22/2020)   Social Connection and Isolation Panel [NHANES]    Frequency of Communication with Friends and Family: Not on file    Frequency of Social Gatherings with Friends and Family: Not on file    Attends Religious Services: Not on file    Active Member of Clubs or Organizations: Not on file    Attends Archivist Meetings: Not on file    Marital Status: Married     Family History: The patient's family history includes Arthritis in his mother; Asthma in his father; Breast cancer in his maternal grandmother; Emphysema in his father; Heart disease in his maternal grandfather and paternal grandfather; Lung cancer in his brother.  ROS:   Please see the history of present illness.    All other systems reviewed and are negative.  EKGs/Labs/Other Studies Reviewed:    The following studies were reviewed today:   EKG:  The ekg ordered today demonstrates NSR  Recent Labs: 10/24/2021: B Natriuretic Peptide 98.5 06/21/2022: BUN 18; Creatinine, Ser 1.19; Potassium 4.3; Sodium 142 08/28/2022: ALT 23; Hemoglobin 15.2; Platelets 160; TSH 1.926  Recent Lipid Panel    Component Value Date/Time   CHOL 127 03/12/2022 1522   CHOL 151 11/19/2012 0147   TRIG 66.0 03/12/2022 1522   TRIG 71 11/19/2012 0147    HDL 40.40 03/12/2022 1522   HDL 26 (L) 11/19/2012 0147   CHOLHDL 3 03/12/2022 1522   VLDL 13.2 03/12/2022 1522   VLDL 14 11/19/2012 0147   LDLCALC 73 03/12/2022 1522   LDLCALC 111 (H) 11/19/2012 0147   LDLDIRECT 78.0 03/12/2022 1522    Physical Exam:    VS:  BP (!) 144/70   Pulse 72   Ht '6\' 4"'$  (1.93 m)   Wt 246 lb (111.6 kg)   SpO2 95%   BMI 29.94 kg/m     Wt Readings from Last 3 Encounters:  08/28/22 246 lb (111.6  kg)  08/27/22 245 lb (111.1 kg)  08/20/22 250 lb 12.8 oz (113.8 kg)     GEN: Well nourished, well developed in no acute distress HEENT: Normal NECK: No JVD; No carotid bruits LYMPHATICS: BLL edema up to knees; ace wrap not removed CARDIAC: RRR, no murmurs, rubs, gallops RESPIRATORY:  Clear to auscultation without rales, wheezing or rhonchi  ABDOMEN: Soft, non-tender, non-distended MUSCULOSKELETAL:  No deformity  SKIN: Warm and dry NEUROLOGIC:  Alert and oriented x 3 PSYCHIATRIC:  Normal affect    ASSESSMENT:    1. Persistent atrial fibrillation ([place])   2. Chronic systolic heart failure ([place])   3. History of DVT (deep vein thrombosis)   4. Encounter for long-term (current) use of high-risk medication   5. Medication management    PLAN:    In order of problems listed above:  #Persistent atrial fibrillation Maintaining sinus rhythm on amiodarone '200mg'$  PO daily. Continue eliquis for stroke ppx.  #Amio monitoring Check CMP, TSH and FT4 today.   Follow up 6 months with APP.     Medication Adjustments/Labs and Tests Ordered: Current medicines are reviewed at length with the patient today.  Concerns regarding medicines are outlined above.  Orders Placed This Encounter  Procedures   TSH   Hepatic function panel   CBC   EKG 12-Lead   No orders of the defined types were placed in this encounter.    Signed, Lars Mage, MD, [facility] 08/28/2022 9:30 PM    Electrophysiology [facility]

## 2022-09-12 ENCOUNTER — Other Ambulatory Visit: Payer: Self-pay | Admitting: Internal Medicine

## 2022-09-18 ENCOUNTER — Other Ambulatory Visit: Payer: Self-pay | Admitting: Cardiology

## 2022-09-18 ENCOUNTER — Ambulatory Visit: Payer: Medicare Other | Admitting: Pulmonary Disease

## 2022-09-18 NOTE — Telephone Encounter (Signed)
Good Afternoon,  Please advise if ok to refill this historical medication for 200 mg daily and whether it should be under Dr.Gollan or Dr. Quentin Ore. Thank you so much.

## 2022-09-24 ENCOUNTER — Encounter (INDEPENDENT_AMBULATORY_CARE_PROVIDER_SITE_OTHER): Payer: Medicare Other

## 2022-09-24 ENCOUNTER — Encounter (INDEPENDENT_AMBULATORY_CARE_PROVIDER_SITE_OTHER): Payer: Self-pay

## 2022-09-24 ENCOUNTER — Ambulatory Visit (INDEPENDENT_AMBULATORY_CARE_PROVIDER_SITE_OTHER): Payer: Medicare Other | Admitting: Vascular Surgery

## 2022-09-24 ENCOUNTER — Ambulatory Visit (INDEPENDENT_AMBULATORY_CARE_PROVIDER_SITE_OTHER): Payer: Medicare Other | Admitting: Nurse Practitioner

## 2022-09-24 VITALS — BP 119/73 | HR 82 | Resp 16 | Wt 237.4 lb

## 2022-09-24 DIAGNOSIS — I878 Other specified disorders of veins: Secondary | ICD-10-CM

## 2022-09-24 NOTE — Progress Notes (Signed)
History of Present Illness  There is no documented history at this time  Assessments & Plan   There are no diagnoses linked to this encounter.    Additional instructions  Subjective:  Patient presents with venous ulcer of the Bilateral lower extremity.    Procedure:  3 layer unna wrap was placed Bilateral lower extremity.   Plan:   Follow up in one week.  

## 2022-10-01 ENCOUNTER — Ambulatory Visit (INDEPENDENT_AMBULATORY_CARE_PROVIDER_SITE_OTHER): Payer: Medicare Other | Admitting: Nurse Practitioner

## 2022-10-01 ENCOUNTER — Other Ambulatory Visit (INDEPENDENT_AMBULATORY_CARE_PROVIDER_SITE_OTHER): Payer: Self-pay | Admitting: Nurse Practitioner

## 2022-10-01 VITALS — BP 144/79 | HR 63 | Ht 76.0 in | Wt 246.0 lb

## 2022-10-01 DIAGNOSIS — I890 Lymphedema, not elsewhere classified: Secondary | ICD-10-CM | POA: Diagnosis not present

## 2022-10-01 MED ORDER — CIPROFLOXACIN HCL 250 MG PO TABS: 750.0000 mg | ORAL_TABLET | Freq: Two times a day (BID) | ORAL | 0 refills | Status: DC

## 2022-10-01 NOTE — Progress Notes (Signed)
History of Present Illness  There is no documented history at this time  Assessments & Plan   There are no diagnoses linked to this encounter.    Additional instructions  Subjective:  Patient presents with venous ulcer of the Bilateral lower extremity.    Procedure:  3 layer unna wrap was placed Bilateral lower extremity.   Plan:   Follow up in one week.  

## 2022-10-06 ENCOUNTER — Encounter (INDEPENDENT_AMBULATORY_CARE_PROVIDER_SITE_OTHER): Payer: Self-pay | Admitting: Nurse Practitioner

## 2022-10-08 ENCOUNTER — Encounter (INDEPENDENT_AMBULATORY_CARE_PROVIDER_SITE_OTHER): Payer: Self-pay

## 2022-10-08 ENCOUNTER — Ambulatory Visit (INDEPENDENT_AMBULATORY_CARE_PROVIDER_SITE_OTHER): Payer: Medicare Other | Admitting: Nurse Practitioner

## 2022-10-08 VITALS — BP 132/74 | HR 67 | Resp 16

## 2022-10-08 DIAGNOSIS — I890 Lymphedema, not elsewhere classified: Secondary | ICD-10-CM

## 2022-10-08 LAB — AEROBIC CULTURE

## 2022-10-08 NOTE — Progress Notes (Signed)
History of Present Illness  There is no documented history at this time  Assessments & Plan   There are no diagnoses linked to this encounter.    Additional instructions  Subjective:  Patient presents with venous ulcer of the Bilateral lower extremity.    Procedure:  3 layer unna wrap was placed Bilateral lower extremity.   Plan:   Follow up in one week.  

## 2022-10-13 ENCOUNTER — Encounter (INDEPENDENT_AMBULATORY_CARE_PROVIDER_SITE_OTHER): Payer: Self-pay | Admitting: Nurse Practitioner

## 2022-10-15 ENCOUNTER — Ambulatory Visit (INDEPENDENT_AMBULATORY_CARE_PROVIDER_SITE_OTHER): Payer: Medicare Other | Admitting: Pulmonary Disease

## 2022-10-15 ENCOUNTER — Ambulatory Visit (INDEPENDENT_AMBULATORY_CARE_PROVIDER_SITE_OTHER): Payer: Medicare Other | Admitting: Nurse Practitioner

## 2022-10-15 ENCOUNTER — Encounter: Payer: Self-pay | Admitting: Pulmonary Disease

## 2022-10-15 VITALS — BP 132/80 | HR 62 | Resp 16 | Ht 76.0 in | Wt 245.0 lb

## 2022-10-15 VITALS — BP 140/76 | HR 67 | Temp 97.6°F | Ht 76.0 in | Wt 247.0 lb

## 2022-10-15 DIAGNOSIS — I2699 Other pulmonary embolism without acute cor pulmonale: Secondary | ICD-10-CM | POA: Diagnosis not present

## 2022-10-15 DIAGNOSIS — I890 Lymphedema, not elsewhere classified: Secondary | ICD-10-CM | POA: Diagnosis not present

## 2022-10-15 DIAGNOSIS — R918 Other nonspecific abnormal finding of lung field: Secondary | ICD-10-CM

## 2022-10-15 DIAGNOSIS — J449 Chronic obstructive pulmonary disease, unspecified: Secondary | ICD-10-CM | POA: Diagnosis not present

## 2022-10-15 DIAGNOSIS — R911 Solitary pulmonary nodule: Secondary | ICD-10-CM

## 2022-10-15 NOTE — Progress Notes (Signed)
History of Present Illness  There is no documented history at this time  Assessments & Plan   There are no diagnoses linked to this encounter.    Additional instructions  Subjective:  Patient presents with venous ulcer of the Bilateral lower extremity.    Procedure:  3 layer unna wrap was placed Bilateral lower extremity.   Plan:   Follow up in one week.  

## 2022-10-15 NOTE — Progress Notes (Signed)
Subjective:    Patient ID: Seth Perez., male    DOB: 1941-06-01, 82 y.o.   MRN: [id number] Patient Care Team: [name], MD as PCP - General (Internal Medicine) Vickie Epley, MD as PCP - Electrophysiology (Cardiology)  Chief Complaint  Patient presents with   Follow-up    Consult re: pulmonary nodule.    Patient profile: Seth Perez first saw the [facility] pulmonary clinic in March of 2014 for evaluation of pulmonary embolism, a pulmonary nodule and COPD. He had moderate obstruction on pulmonary function testing and was prescribed Spiriva. He had a provoked pulmonary embolism in late February 2014 and was treated for this with Xarelto. He also had a 1.2 cm pleural-based nodule in the right upper lobe.  In may of 2015 he had a repeat large pulmonary embolism requiring thrombolytic therapy and an IVC filter placement. He quit smoking in 2014, 1 ppd for about 57 years.  Last saw Dr. Simonne Maffucci 18 June 2017.  Last CT chest was 24 November 2019 classified as lung RADS 2S with the nodule of concern on the left upper lobe measuring 6.7 mm.  No nodules on right.  HPI The patient is an 82 year old former smoker with a very complex history as noted.  He is referred for reassessment of a lung nodule previously followed by the [facility].  The patient is kindly referred by Dr. Deborra Medina.  The patient has not had imaging since 2021 however the nodule in question on the left upper lobe appeared to be stable at last imaging of 2021.  The patient has "aged out" of the low-dose lung cancer screening program.  The patient presents today with no active complaints.  He presents transport chair due to significant lymphedema of the lower extremities and significant decreased mobility due to the same.  He is a very terse historian.  He has issues with persistent atrial fibrillation, chronic systolic heart failure (LVEF 45%) and prior history of DVT  and submassive PE x 2.  The patient is currently on anticoagulation and has also an IVC filter in place.  He does not endorse dyspnea.  Does not endorse cough or sputum production.  No hemoptysis.  He is not on any inhalers currently set for as needed albuterol and he rarely uses this.  He does not endorse any fevers, chills or sweats.  No orthopnea or paroxysmal nocturnal dyspnea.  No chest pain.  Has regular follow-ups with cardiology.   Review of Systems A 10 point review of systems was performed and it is as noted above otherwise negative.  Past Medical History:  Diagnosis Date   Arthritis    Chicken pox    COPD (chronic obstructive pulmonary disease) ([place])    Pulmonary emboli ([place])    on Xarelto   Past Surgical History:  Procedure Laterality Date   CARDIOVERSION N/A 02/22/2022   Procedure: CARDIOVERSION;  Surgeon: Wellington Hampshire, MD;  Location: [facility] ORS;  Service: Cardiovascular;  Laterality: N/A;   CARDIOVERSION N/A 06/28/2022   Procedure: CARDIOVERSION;  Surgeon: Minna Merritts, MD;  Location: [facility] ORS;  Service: Cardiovascular;  Laterality: N/A;   CHOLECYSTECTOMY  2008   TONSILLECTOMY AND ADENOIDECTOMY  1947   Patient Active Problem List   Diagnosis Date Noted   SOB (shortness of breath)    Microalbuminuria due to type 2 diabetes mellitus ([place]) 06/13/2022   Type 2 diabetes mellitus with vascular disease ([place]) 06/12/2022   Lymphedema 04/09/2022  Atrial fibrillation ([place]) 10/24/2021   Overweight (BMI 25.0-29.9) 08/17/2019   Recurrent pulmonary embolism ([place]) 03/22/2019   Hard of hearing 09/05/2018   Hyperlipidemia with low HDL 02/02/2017   Insomnia 02/02/2017   Hypovitaminosis D 02/02/2017   S/P insertion of IVC (inferior vena caval) filter 02/02/2017   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   Personal history of tobacco use, presenting hazards to health 05/21/2016   Orthostasis 02/29/2016   Tobacco use disorder, mild, in sustained remission, abuse 02/16/2016   Lower  extremity venous stasis 06/19/2014   Benign localized hyperplasia of prostate with urinary obstruction and other lower urinary tract symptoms (LUTS)(600.21) 01/06/2013   Elevated prostate specific antigen (PSA) 01/06/2013   Incomplete emptying of bladder 01/06/2013   Microscopic hematuria 01/06/2013   History of pulmonary embolism 11/24/2012   Multiple pulmonary nodules determined by computed tomography of lung 11/24/2012   Hospital discharge follow-up 11/24/2012   COPD (chronic obstructive pulmonary disease) with emphysema ([place]) 11/03/2012   Colon cancer screening 11/03/2012   Prostate cancer screening 11/03/2012   Routine general medical examination at a health care facility 11/03/2012   Family History  Problem Relation Age of Onset   Arthritis Mother    Lung cancer Brother        was a smoker   Breast cancer Maternal Grandmother    Heart disease Maternal Grandfather    Asthma Father    Emphysema Father    Heart disease Paternal Grandfather    Social History   Tobacco Use   Smoking status: Some Days    Packs/day: 1.00    Years: 59.00    Total pack years: 59.00    Types: Cigarettes   Smokeless tobacco: Never  Substance Use Topics   Alcohol use: Not Currently    Comment: rare   Allergies  Allergen Reactions   Adhesive [Tape] Rash   Current Meds  Medication Sig   albuterol (VENTOLIN HFA) 108 (90 Base) MCG/ACT inhaler Inhale 2 puffs into the lungs every 6 (six) hours as needed for wheezing.   amiodarone (PACERONE) 200 MG tablet Take 1 tablet (200 mg total) by mouth daily.   apixaban (ELIQUIS) 5 MG TABS tablet Take 1 tablet (5 mg total) by mouth 2 (two) times daily.   Cobalamin Combinations (B-12) 1000-400 MCG SUBL Place 1,000 mcg under the tongue daily.   dapagliflozin propanediol (FARXIGA) 10 MG TABS tablet Take 1 tablet (10 mg total) by mouth daily before breakfast.   furosemide (LASIX) 20 MG tablet Take 1 tablet (20 mg total) by mouth daily as needed.   metoprolol  succinate (TOPROL-XL) 25 MG 24 hr tablet Take 1 tablet (25 mg total) by mouth in the morning and at bedtime.   Potassium 99 MG TABS Take 1 tablet by mouth daily.   simvastatin (ZOCOR) 20 MG tablet TAKE 1 TABLET BY MOUTH EVERYDAY AT BEDTIME   telmisartan (MICARDIS) 20 MG tablet TAKE 1 TABLET BY MOUTH EVERY DAY   triamcinolone (KENALOG) 0.025 % ointment Apply 1 application  topically as needed.   VITAMIN D, CHOLECALCIFEROL, PO Take 5,000 Units by mouth daily.       Objective:   Physical Exam BP (!) 140/76 (BP Location: Left Arm, Cuff Size: Large)   Pulse 67   Temp 97.6 F (36.4 C) (Oral)   Ht '6\' 4"'$  (1.93 m)   Wt 247 lb (112 kg)   SpO2 97% Comment: on RA  BMI 30.07 kg/m   SpO2: 97 % (on RA) O2 Device: None (Room air)  GENERAL: Overweight gentleman, presents in transport chair.  No conversational dyspnea.  No acute distress. HEAD: Normocephalic, atraumatic.  EYES: Pupils equal, round, reactive to light.  No scleral icterus.  MOUTH: Horrible dentition, missing teeth, NECK: Supple. No thyromegaly. Trachea midline. No JVD.  No adenopathy. PULMONARY: Good air entry bilaterally.  No adventitious sounds. CARDIOVASCULAR: S1 and S2.  Regular rate and rhythm.  No murmurs ABDOMEN: Protuberant, otherwise benign. MUSCULOSKELETAL: No joint deformity, no clubbing, massive lymphedema of the lower extremities.   NEUROLOGIC: Gait not tested.  Awake, alert, oriented, no overt focal deficit. SKIN: Intact,warm,dry. PSYCH: Normal mood and behavior.     Assessment & Plan:     ICD-10-CM   1. Nodule of upper lobe of left lung  R91.1 CT CHEST WO CONTRAST   Last measurement 6.7 mm in 2021 Will obtain CT chest to reassess If nodule stable no further imaging is necessary    2. Moderate COPD (chronic obstructive pulmonary disease) (HCC)  J44.9 Pulmonary Function Test [facility] Only   Reestablish baseline with PFTs Continue using albuterol as needed    3. Recurrent pulmonary embolism (HCC)  I26.99     On Eliquis Has IVC in place     Orders Placed This Encounter  Procedures   CT CHEST WO CONTRAST    Next available    Standing Status:   Future    Standing Expiration Date:   10/16/2023    Order Specific Question:   Preferred imaging location?    Answer:   [facility]   Pulmonary Function Test [facility] Only    Standing Status:   Future    Standing Expiration Date:   10/16/2023    Scheduling Instructions:     Next available    Order Specific Question:   Full PFT: includes the following: basic spirometry, spirometry pre & post bronchodilator, diffusion capacity (DLCO), lung volumes    Answer:   Full PFT   We will obtain a chest CT to reevaluate the patient's nodule.  If the the nodule is stable no further imaging is necessary.  If the nodule has grown the patient understands that he is a very poor candidate for invasive procedures need to reassess with less invasive modalities.  We will reassess his lung function with PFTs.  Will see the patient in follow-up in 3 months time he is to contact us prior to that time should any new difficulties arise.  Seth Don, MD Advanced Bronchoscopy P[facility]    *This note was dictated using voice recognition software/Dragon.  Despite best efforts to proofread, errors can occur which can change the meaning. Any transcriptional errors that result from this process are unintentional and may not be fully corrected at the time of dictation.

## 2022-10-15 NOTE — Patient Instructions (Signed)
We are getting another chest CT to check on the lung nodule.  We are going to get some breathing test to get another baseline on your breathing function.  We will see you in follow-up in 3 months time.  We will call you with the results of your CT once these are known.

## 2022-10-22 ENCOUNTER — Encounter (INDEPENDENT_AMBULATORY_CARE_PROVIDER_SITE_OTHER): Payer: Self-pay

## 2022-10-22 ENCOUNTER — Ambulatory Visit (INDEPENDENT_AMBULATORY_CARE_PROVIDER_SITE_OTHER): Payer: Medicare Other | Admitting: Nurse Practitioner

## 2022-10-22 VITALS — BP 152/80 | HR 61 | Resp 16 | Wt 243.0 lb

## 2022-10-22 DIAGNOSIS — I890 Lymphedema, not elsewhere classified: Secondary | ICD-10-CM | POA: Diagnosis not present

## 2022-10-22 NOTE — Progress Notes (Signed)
History of Present Illness  There is no documented history at this time  Assessments & Plan   There are no diagnoses linked to this encounter.    Additional instructions  Subjective:  Patient presents with venous ulcer of the Bilateral lower extremity.    Procedure:  3 layer unna wrap was placed Bilateral lower extremity.   Plan:   Follow up in one week.  

## 2022-10-28 ENCOUNTER — Encounter (INDEPENDENT_AMBULATORY_CARE_PROVIDER_SITE_OTHER): Payer: Self-pay | Admitting: Nurse Practitioner

## 2022-10-29 ENCOUNTER — Encounter (INDEPENDENT_AMBULATORY_CARE_PROVIDER_SITE_OTHER): Payer: Self-pay | Admitting: Vascular Surgery

## 2022-10-29 ENCOUNTER — Ambulatory Visit: Payer: Medicare Other

## 2022-10-29 ENCOUNTER — Ambulatory Visit (INDEPENDENT_AMBULATORY_CARE_PROVIDER_SITE_OTHER): Payer: Medicare Other | Admitting: Vascular Surgery

## 2022-10-29 VITALS — BP 132/76 | HR 64 | Ht 76.0 in | Wt 243.0 lb

## 2022-10-29 DIAGNOSIS — I87009 Postthrombotic syndrome without complications of unspecified extremity: Secondary | ICD-10-CM | POA: Diagnosis not present

## 2022-10-29 DIAGNOSIS — E786 Lipoprotein deficiency: Secondary | ICD-10-CM

## 2022-10-29 DIAGNOSIS — E785 Hyperlipidemia, unspecified: Secondary | ICD-10-CM

## 2022-10-29 DIAGNOSIS — I2699 Other pulmonary embolism without acute cor pulmonale: Secondary | ICD-10-CM

## 2022-10-29 DIAGNOSIS — I890 Lymphedema, not elsewhere classified: Secondary | ICD-10-CM | POA: Diagnosis not present

## 2022-10-29 DIAGNOSIS — J432 Centrilobular emphysema: Secondary | ICD-10-CM

## 2022-10-29 NOTE — Progress Notes (Signed)
MRN : [id number]  Seth AZEVEDO Sr. is a 82 y.o. ([birth year]) male who presents with chief complaint of  Chief Complaint  Patient presents with   Follow-up  .  History of Present Illness: Patient returns today in follow up of His leg swelling and lymphedema.  His skin ulcerations have healed again with Unna boots.  He has not yet gotten his lymphedema pump, but the company representative has been to his home to begin the process so hopefully he will be getting that soon.  No fevers or chills.  Legs not hurting him much today.  Current Outpatient Medications  Medication Sig Dispense Refill   albuterol (VENTOLIN HFA) 108 (90 Base) MCG/ACT inhaler Inhale 2 puffs into the lungs every 6 (six) hours as needed for wheezing. 6.7 g 11   amiodarone (PACERONE) 200 MG tablet Take 1 tablet (200 mg total) by mouth daily. 90 tablet 3   apixaban (ELIQUIS) 5 MG TABS tablet Take 1 tablet (5 mg total) by mouth 2 (two) times daily. 60 tablet 0   Cobalamin Combinations (B-12) 1000-400 MCG SUBL Place 1,000 mcg under the tongue daily. 90 tablet 3   dapagliflozin propanediol (FARXIGA) 10 MG TABS tablet Take 1 tablet (10 mg total) by mouth daily before breakfast. 30 tablet 5   furosemide (LASIX) 20 MG tablet Take 1 tablet (20 mg total) by mouth daily as needed. 30 tablet 3   metoprolol succinate (TOPROL-XL) 25 MG 24 hr tablet Take 1 tablet (25 mg total) by mouth in the morning and at bedtime. 180 tablet 3   Potassium 99 MG TABS Take 1 tablet by mouth daily.     simvastatin (ZOCOR) 20 MG tablet TAKE 1 TABLET BY MOUTH EVERYDAY AT BEDTIME 90 tablet 1   telmisartan (MICARDIS) 20 MG tablet TAKE 1 TABLET BY MOUTH EVERY DAY 30 tablet 2   triamcinolone (KENALOG) 0.025 % ointment Apply 1 application  topically as needed.     VITAMIN D, CHOLECALCIFEROL, PO Take 5,000 Units by mouth daily.     No current facility-administered medications for this visit.    Past Medical History:  Diagnosis Date   Arthritis     Chicken pox    COPD (chronic obstructive pulmonary disease) ([place])    Pulmonary emboli (HCC)    on Xarelto    Past Surgical History:  Procedure Laterality Date   CARDIOVERSION N/A 02/22/2022   Procedure: CARDIOVERSION;  Surgeon: Wellington Hampshire, MD;  Location: [facility] ORS;  Service: Cardiovascular;  Laterality: N/A;   CARDIOVERSION N/A 06/28/2022   Procedure: CARDIOVERSION;  Surgeon: Minna Merritts, MD;  Location: [facility] ORS;  Service: Cardiovascular;  Laterality: N/A;   CHOLECYSTECTOMY  2008   TONSILLECTOMY AND ADENOIDECTOMY  1947     Social History   Tobacco Use   Smoking status: Some Days    Packs/day: 1.00    Years: 59.00    Total pack years: 59.00    Types: Cigarettes   Smokeless tobacco: Never  Vaping Use   Vaping Use: Never used  Substance Use Topics   Alcohol use: Not Currently    Comment: rare   Drug use: No      Family History  Problem Relation Age of Onset   Arthritis Mother    Lung cancer Brother        was a smoker   Breast cancer Maternal Grandmother    Heart disease Maternal Grandfather    Asthma Father    Emphysema Father    Heart  disease Paternal Grandfather     Allergies  Allergen Reactions   Adhesive [Tape] Rash     REVIEW OF SYSTEMS (Negative unless checked) Constitutional: '[]'$ Weight loss  '[]'$ Fever  '[]'$ Chills Cardiac: '[]'$ Chest pain   '[]'$  Atrial Fibrillation  '[]'$ Palpitations   '[]'$ Shortness of breath when laying flat   '[]'$ Shortness of breath with exertion. '[]'$ Shortness of breath at rest Vascular:  '[]'$ Pain in legs with walking   '[]'$ Pain in legs with standing '[]'$ Pain in legs when laying flat   '[]'$ Claudication    '[]'$ Pain in feet when laying flat    '[x]'$ History of DVT   '[x]'$ Phlebitis   '[x]'$ Swelling in legs   '[]'$ Varicose veins   '[]'$ Non-healing ulcers Pulmonary:   '[]'$ Uses home oxygen   '[]'$ Productive cough   '[]'$ Hemoptysis   '[]'$ Wheeze  '[x]'$ COPD   '[]'$ Asthma Neurologic:  '[]'$ Dizziness   '[]'$ Seizures  '[]'$ Blackouts '[]'$ History of stroke   '[]'$ History of TIA  '[]'$ Aphasia   '[]'$ Temporary  Blindness   '[]'$ Weakness or numbness in arm   '[]'$ Weakness or numbness in leg Musculoskeletal:   '[]'$ Joint swelling   '[]'$ Joint pain   '[]'$ Low back pain  '[]'$  History of Knee Replacement '[x]'$ Arthritis '[]'$ back Surgeries  '[]'$  Spinal Stenosis    Hematologic:  '[]'$ Easy bruising  '[]'$ Easy bleeding   '[]'$ Hypercoagulable state   '[]'$ Anemic Gastrointestinal:  '[]'$ Diarrhea   '[]'$ Vomiting  '[]'$ Gastroesophageal reflux/heartburn   '[]'$ Difficulty swallowing. '[]'$ Abdominal pain Genitourinary:  '[]'$ Chronic kidney disease   '[]'$ Difficult urination  '[]'$ Anuric   '[]'$ Blood in urine '[]'$ Frequent urination  '[]'$ Burning with urination   '[]'$ Hematuria Skin:  '[]'$ Rashes   '[]'$ Ulcers '[]'$ Wounds Psychological:  '[]'$ History of anxiety   '[]'$  History of major depression  '[]'$  Memory Difficulties   Physical Examination  BP 132/76   Pulse 64   Ht '6\' 4"'$  (1.93 m)   Wt 243 lb (110.2 kg)   BMI 29.58 kg/m  Gen:  WD/WN, NAD Head: [place]/AT, No temporalis wasting. Ear/Nose/Throat: Hearing grossly intact, nares w/o erythema or drainage Eyes: Conjunctiva clear. Sclera non-icteric Neck: Supple.  Trachea midline Pulmonary:  Good air movement, no use of accessory muscles.  Cardiac: RRR, no JVD Vascular:  Vessel Right Left  Radial Palpable Palpable               Musculoskeletal: M/S 5/5 throughout.  No deformity or atrophy.  1+ right lower extremity edema, 2+ left lower extremity edema.  Marked stasis dermatitis changes are present in both lower extremities. Neurologic: Sensation grossly intact in extremities.  Symmetrical.  Speech is fluent.  Psychiatric: Judgment intact, Mood & affect appropriate for pt's clinical situation.       Labs Recent Results (from the past 2160 hour(s))  CBC     Status: None   Collection Time: 08/28/22  4:14 PM  Result Value Ref Range   WBC 8.3 4.0 - 10.5 K/uL   RBC 4.89 4.22 - 5.81 MIL/uL   Hemoglobin 15.2 13.0 - 17.0 g/dL   HCT 46.0 39.0 - 52.0 %   MCV 94.1 80.0 - 100.0 fL   MCH 31.1 26.0 - 34.0 pg   MCHC 33.0 30.0 - 36.0 g/dL   RDW 13.1  11.5 - 15.5 %   Platelets 160 150 - 400 K/uL   nRBC 0.0 0.0 - 0.2 %    Comment: Performed at [facility], [address] [place], [place] [address]  Hepatic function panel     Status: None   Collection Time: 08/28/22  4:14 PM  Result Value Ref Range   Total Protein 8.0 6.5 - 8.1 g/dL  Albumin 4.5 3.5 - 5.0 g/dL   AST 22 15 - 41 U/L   ALT 23 0 - 44 U/L   Alkaline Phosphatase 66 38 - 126 U/L   Total Bilirubin 1.1 0.3 - 1.2 mg/dL   Bilirubin, Direct 0.2 0.0 - 0.2 mg/dL   Indirect Bilirubin 0.9 0.3 - 0.9 mg/dL    Comment: Performed at [facility], [address] [place], [place] [address]  TSH     Status: None   Collection Time: 08/28/22  4:14 PM  Result Value Ref Range   TSH 1.926 0.350 - 4.500 uIU/mL    Comment: Performed by a 3rd Generation assay with a functional sensitivity of <=0.01 uIU/mL. Performed at [facility], [address] [place], [place] [address]   Aerobic culture     Status: Abnormal   Collection Time: 10/01/22  2:00 PM  Result Value Ref Range   Aerobic Bacterial Culture Final report (A)    Organism ID, Bacteria Comment (A)     Comment: Methicillin - resistant Staphylococcus aureus Based on resistance to oxacillin this isolate would be resistant to all currently available beta-lactam antimicrobial agents, with the exception of the newer cephalosporins with anti-MRSA activity, such as Ceftaroline Scant growth    Organism ID, Bacteria Comment (A)     Comment: Stenotrophomonas maltophilia Scant growth    Organism ID, Bacteria Routine flora     Comment: Moderate growth   Antimicrobial Susceptibility Comment     Comment:       ** S = Susceptible; I = Intermediate; R = Resistant **                    P = Positive; N = Negative             MICS are expressed in micrograms per mL    Antibiotic                 RSLT#1    RSLT#2    RSLT#3    RSLT#4 Ceftazidime                              I Chloramphenicol                           S Ciprofloxacin                  S Clindamycin                    R Erythromycin                   R Gentamicin                     S Levofloxacin                             S Levofloxacin                   S Linezolid                      S Oxacillin                      R Penicillin  R Rifampin                       R Tetracycline                   S Trimethoprim/Sulfa                       R Trimethoprim/Sulfa             S Vancomycin                     S     Radiology No results found.  Assessment/Plan Hyperlipidemia with low HDL lipid control important in reducing the progression of atherosclerotic disease. Continue statin therapy     COPD (chronic obstructive pulmonary disease) with emphysema (HCC) Continue pulmonary medications and aerosols as already ordered, these medications have been reviewed and there are no changes at this time.   Recurrent pulmonary embolism ([place]) Remains on anticoagulation with filter in place.   Type 2 diabetes mellitus with vascular disease (HCC) blood glucose control important in reducing the progression of atherosclerotic disease. Also, involved in wound healing. On appropriate medications.  Post-phlebitic syndrome We will continue weekly Unna boots for the next 4 to 6 weeks until he gets his lymphedema pump and can begin using it at home.  He failed quickly when he came out of the Unna boots few weeks ago.  A 3 layer Unna boot was placed on both lower extremities today and will be changed weekly.  Lymphedema The patient has severe hyperpigmentation, leg swelling refractory to compression and elevation, and has been in compression Unna boot wraps now for months and was in a compression socks for years prior to that.  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  His lymphedema pump process is underway and I do think that will be quite helpful.  Continue the Unna boots for the next several  weeks.    Leotis Pain, MD  10/29/2022 3:49 PM    This note was created with Dragon medical transcription system.  Any errors from dictation are purely unintentional

## 2022-10-29 NOTE — Assessment & Plan Note (Signed)
We will continue weekly Unna boots for the next 4 to 6 weeks until he gets his lymphedema pump and can begin using it at home.  He failed quickly when he came out of the Unna boots few weeks ago.  A 3 layer Unna boot was placed on both lower extremities today and will be changed weekly.

## 2022-10-29 NOTE — Assessment & Plan Note (Signed)
The patient has severe hyperpigmentation, leg swelling refractory to compression and elevation, and has been in compression Unna boot wraps now for months and was in a compression socks for years prior to that.  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  His lymphedema pump process is underway and I do think that will be quite helpful.  Continue the Unna boots for the next several weeks.

## 2022-11-03 ENCOUNTER — Encounter (INDEPENDENT_AMBULATORY_CARE_PROVIDER_SITE_OTHER): Payer: Self-pay | Admitting: Nurse Practitioner

## 2022-11-05 ENCOUNTER — Ambulatory Visit (INDEPENDENT_AMBULATORY_CARE_PROVIDER_SITE_OTHER): Payer: Medicare Other | Admitting: Nurse Practitioner

## 2022-11-05 ENCOUNTER — Encounter (INDEPENDENT_AMBULATORY_CARE_PROVIDER_SITE_OTHER): Payer: Self-pay

## 2022-11-05 VITALS — BP 119/76 | HR 63 | Resp 18

## 2022-11-05 DIAGNOSIS — I890 Lymphedema, not elsewhere classified: Secondary | ICD-10-CM | POA: Diagnosis not present

## 2022-11-05 NOTE — Progress Notes (Signed)
History of Present Illness  There is no documented history at this time  Assessments & Plan   There are no diagnoses linked to this encounter.    Additional instructions  Subjective:  Patient presents with venous ulcer of the Bilateral lower extremity.    Procedure:  3 layer unna wrap was placed Bilateral lower extremity.   Plan:   Follow up in one week.  

## 2022-11-08 ENCOUNTER — Other Ambulatory Visit: Payer: Self-pay | Admitting: Medical

## 2022-11-12 ENCOUNTER — Encounter (INDEPENDENT_AMBULATORY_CARE_PROVIDER_SITE_OTHER): Payer: Self-pay | Admitting: Nurse Practitioner

## 2022-11-12 ENCOUNTER — Telehealth (INDEPENDENT_AMBULATORY_CARE_PROVIDER_SITE_OTHER): Payer: Self-pay | Admitting: Nurse Practitioner

## 2022-11-12 ENCOUNTER — Ambulatory Visit (INDEPENDENT_AMBULATORY_CARE_PROVIDER_SITE_OTHER): Payer: Medicare Other | Admitting: Nurse Practitioner

## 2022-11-12 ENCOUNTER — Ambulatory Visit
Admission: RE | Admit: 2022-11-12 | Discharge: 2022-11-12 | Disposition: A | Discharge status: Home / Self Care | Payer: Medicare Other | Source: Ambulatory Visit | Attending: Pulmonary Disease | Admitting: Pulmonary Disease

## 2022-11-12 DIAGNOSIS — I890 Lymphedema, not elsewhere classified: Secondary | ICD-10-CM

## 2022-11-12 DIAGNOSIS — J439 Emphysema, unspecified: Secondary | ICD-10-CM | POA: Diagnosis not present

## 2022-11-12 DIAGNOSIS — R911 Solitary pulmonary nodule: Secondary | ICD-10-CM | POA: Insufficient documentation

## 2022-11-12 DIAGNOSIS — R918 Other nonspecific abnormal finding of lung field: Secondary | ICD-10-CM | POA: Diagnosis not present

## 2022-11-12 NOTE — Telephone Encounter (Signed)
After 4 weeks of exercise, elevation, and compression patient lymphedema and hyperpigmentation has not improved.  Recommendation of pump.   Patient recorded Measurements:   Left ankle 36.8cm (1/16); 37.0 cm (2/13) Right ankle 34.4 cm (1/16); 34.8 cm (2/13) Left calf 45.2 cm (1/16); 45.5 cm (2/13) Right calf 47.6 cm (1/16); 48.1 cm (2/13)

## 2022-11-12 NOTE — Progress Notes (Unsigned)
History of Present Illness  There is no documented history at this time  Assessments & Plan   There are no diagnoses linked to this encounter.    Additional instructions  Subjective:  Patient presents with venous ulcer of the Bilateral lower extremity.    Procedure:  3 layer unna wrap was placed Bilateral lower extremity.   Plan:   Follow up in one week.  

## 2022-11-13 ENCOUNTER — Encounter (INDEPENDENT_AMBULATORY_CARE_PROVIDER_SITE_OTHER): Payer: Self-pay | Admitting: Nurse Practitioner

## 2022-11-14 ENCOUNTER — Encounter: Payer: Self-pay | Admitting: Internal Medicine

## 2022-11-14 DIAGNOSIS — N62 Hypertrophy of breast: Secondary | ICD-10-CM | POA: Insufficient documentation

## 2022-11-19 ENCOUNTER — Ambulatory Visit (INDEPENDENT_AMBULATORY_CARE_PROVIDER_SITE_OTHER): Payer: Medicare Other | Admitting: Nurse Practitioner

## 2022-11-19 ENCOUNTER — Encounter (INDEPENDENT_AMBULATORY_CARE_PROVIDER_SITE_OTHER): Payer: Self-pay

## 2022-11-19 VITALS — BP 119/69 | HR 50 | Resp 18

## 2022-11-19 DIAGNOSIS — I890 Lymphedema, not elsewhere classified: Secondary | ICD-10-CM

## 2022-11-19 NOTE — Progress Notes (Unsigned)
History of Present Illness  There is no documented history at this time  Assessments & Plan   There are no diagnoses linked to this encounter.    Additional instructions  Subjective:  Patient presents with venous ulcer of the Bilateral lower extremity.    Procedure:  3 layer unna wrap was placed Bilateral lower extremity.   Plan:   Follow up in one week.  

## 2022-11-20 ENCOUNTER — Encounter (INDEPENDENT_AMBULATORY_CARE_PROVIDER_SITE_OTHER): Payer: Self-pay | Admitting: Nurse Practitioner

## 2022-11-26 ENCOUNTER — Encounter (INDEPENDENT_AMBULATORY_CARE_PROVIDER_SITE_OTHER): Payer: Self-pay

## 2022-11-26 ENCOUNTER — Ambulatory Visit (INDEPENDENT_AMBULATORY_CARE_PROVIDER_SITE_OTHER): Payer: Medicare Other | Admitting: Nurse Practitioner

## 2022-11-26 VITALS — BP 146/83 | HR 62 | Resp 16

## 2022-11-26 DIAGNOSIS — I890 Lymphedema, not elsewhere classified: Secondary | ICD-10-CM | POA: Diagnosis not present

## 2022-11-26 NOTE — Progress Notes (Signed)
History of Present Illness  There is no documented history at this time  Assessments & Plan   There are no diagnoses linked to this encounter.    Additional instructions  Subjective:  Patient presents with venous ulcer of the Bilateral lower extremity.    Procedure:  3 layer unna wrap was placed Bilateral lower extremity.   Plan:   Follow up in one week.  

## 2022-11-28 ENCOUNTER — Ambulatory Visit (INDEPENDENT_AMBULATORY_CARE_PROVIDER_SITE_OTHER): Payer: Medicare Other | Admitting: *Deleted

## 2022-11-28 ENCOUNTER — Encounter: Payer: Self-pay | Admitting: *Deleted

## 2022-11-28 VITALS — BP 132/76 | Ht 76.0 in | Wt 243.0 lb

## 2022-11-28 DIAGNOSIS — Z0000 Encounter for general adult medical examination without abnormal findings: Secondary | ICD-10-CM

## 2022-11-28 NOTE — Progress Notes (Signed)
I connected with  Seth Bales Sr. on 11/28/22 by a audio enabled telemedicine application and verified that I am speaking with the correct person using two identifiers.  Patient Location: Home  Provider Location: Office/Clinic  I discussed the limitations of evaluation and management by telemedicine. The patient expressed understanding and agreed to proceed.   Subjective:   Seth LARGENT Sr. is a 82 y.o. male who presents for Medicare Annual/Subsequent preventive examination.       Objective:    Today's Vitals   11/28/22 1534  BP: 132/76  Weight: 243 lb (110.2 kg)  Height: '6\' 4"'$  (1.93 m)   Body mass index is 29.58 kg/m.     11/28/2022    3:51 PM 10/24/2021    4:20 PM 08/22/2020    2:28 PM 08/17/2019   12:16 AM 08/16/2019    6:21 PM 08/05/2018   10:53 AM 09/19/2017   10:22 PM  Advanced Directives  Does Patient Have a Medical Advance Directive? Yes Yes Yes No No Yes Yes  Type of Paramedic of [place];Living will Healthcare Power of [place];Living will   Living will;Healthcare Power of [place]  Does patient want to make changes to medical advance directive? No - Patient declined  No - Patient declined   No - Patient declined No - Patient declined  Copy of [place] in Chart? No - copy requested  No - copy requested   No - copy requested No - copy requested  Would patient like information on creating a medical advance directive?    No - Patient declined No - Patient declined      Current Medications (verified) Outpatient Encounter Medications as of 11/28/2022  Medication Sig   albuterol (VENTOLIN HFA) 108 (90 Base) MCG/ACT inhaler Inhale 2 puffs into the lungs every 6 (six) hours as needed for wheezing.   amiodarone (PACERONE) 200 MG tablet Take 1 tablet (200 mg total) by mouth daily.   apixaban (ELIQUIS) 5 MG TABS tablet Take 1 tablet (5 mg total) by mouth 2  (two) times daily.   Cobalamin Combinations (B-12) 1000-400 MCG SUBL Place 1,000 mcg under the tongue daily.   FARXIGA 10 MG TABS tablet TAKE 1 TABLET BY MOUTH DAILY BEFORE BREAKFAST.   metoprolol succinate (TOPROL-XL) 25 MG 24 hr tablet Take 1 tablet (25 mg total) by mouth in the morning and at bedtime.   Potassium 99 MG TABS Take 1 tablet by mouth daily.   simvastatin (ZOCOR) 20 MG tablet TAKE 1 TABLET BY MOUTH EVERYDAY AT BEDTIME   telmisartan (MICARDIS) 20 MG tablet TAKE 1 TABLET BY MOUTH EVERY DAY   VITAMIN D, CHOLECALCIFEROL, PO Take 5,000 Units by mouth daily.   furosemide (LASIX) 20 MG tablet Take 1 tablet (20 mg total) by mouth daily as needed. (Patient not taking: Reported on 11/28/2022)   triamcinolone (KENALOG) 0.025 % ointment Apply 1 application  topically as needed. (Patient not taking: Reported on 11/28/2022)   No facility-administered encounter medications on file as of 11/28/2022.    Allergies (verified) Adhesive [tape]   History: Past Medical History:  Diagnosis Date   Arthritis    Chicken pox    COPD (chronic obstructive pulmonary disease) (HCC)    Pulmonary emboli ([place])    on Xarelto   Past Surgical History:  Procedure Laterality Date   CARDIOVERSION N/A 02/22/2022   Procedure: CARDIOVERSION;  Surgeon: Wellington Hampshire, MD;  Location: [facility] ORS;  Service: Cardiovascular;  Laterality: N/A;   CARDIOVERSION N/A 06/28/2022   Procedure: CARDIOVERSION;  Surgeon: Minna Merritts, MD;  Location: [facility] ORS;  Service: Cardiovascular;  Laterality: N/A;   CHOLECYSTECTOMY  2008   TONSILLECTOMY AND ADENOIDECTOMY  1947   Family History  Problem Relation Age of Onset   Arthritis Mother    Lung cancer Brother        was a smoker   Breast cancer Maternal Grandmother    Heart disease Maternal Grandfather    Asthma Father    Emphysema Father    Heart disease Paternal Grandfather    Social History   Socioeconomic History   Marital status: Divorced    Spouse name: Not on file    Number of children: Not on file   Years of education: Not on file   Highest education level: Not on file  Occupational History   Not on file  Tobacco Use   Smoking status: Some Days    Packs/day: 1.00    Years: 59.00    Total pack years: 59.00    Types: Cigarettes   Smokeless tobacco: Never  Vaping Use   Vaping Use: Never used  Substance and Sexual Activity   Alcohol use: Not Currently    Comment: rare   Drug use: No   Sexual activity: Not Currently  Other Topics Concern   Not on file  Social History Narrative   Not on file   Social Determinants of Health   Financial Resource Strain: Medium Risk (11/14/2021)   Overall Financial Resource Strain (CARDIA)    Difficulty of Paying Living Expenses: Somewhat hard  Food Insecurity: Unknown (09/19/2017)   Hunger Vital Sign    Worried About Running Out of Food in the Last Year: Patient refused    [place] in the Last Year: Patient refused  Transportation Needs: No Transportation Needs (08/22/2020)   PRAPARE - Hydrologist (Medical): No    Lack of Transportation (Non-Medical): No  Physical Activity: Unknown (08/22/2020)   Exercise Vital Sign    Days of Exercise per Week: 0 days    Minutes of Exercise per Session: Not on file  Stress: No Stress Concern Present (08/22/2020)   [place]    Feeling of Stress : Not at all  Social Connections: Unknown (08/22/2020)   Social Connection and Isolation Panel [NHANES]    Frequency of Communication with Friends and Family: Not on file    Frequency of Social Gatherings with Friends and Family: Not on file    Attends Religious Services: Not on file    Active Member of Clubs or Organizations: Not on file    Attends Archivist Meetings: Not on file    Marital Status: Married    Tobacco Counseling Ready to quit: Not Answered Counseling given: Not Answered   Clinical  Intake:  Pre-visit preparation completed: Yes  Pain : No/denies pain     BMI - recorded: 29.59 Nutritional Status: BMI 25 -29 Overweight Nutritional Risks: None Diabetes: Yes CBG done?: No Did pt. bring in CBG monitor from home?: No  How often do you need to have someone help you when you read instructions, pamphlets, or other written materials from your doctor or pharmacy?: 3 - Sometimes  Diabetic? Yes        Activities of Daily Living    11/28/2022    3:43 PM 02/22/2022    7:08 AM  In  your present state of health, do you have any difficulty performing the following activities:  Hearing? 1 1  Comment wears hearing aids   Vision? 0 0  Difficulty concentrating or making decisions? 0 0  Walking or climbing stairs? 1 0  Comment has trouble with legs a lot   Dressing or bathing? 1 0  Comment Due to leg issues   Doing errands, shopping? 1   Comment sometimes   Preparing Food and eating ? N   Using the Toilet? N   In the past six months, have you accidently leaked urine? N   Do you have problems with loss of bowel control? N   Managing your Medications? N   Managing your Finances? N   Housekeeping or managing your Housekeeping? N   Comment unable to do any bending     Patient Care Team: [name], MD as PCP - General (Internal Medicine) Vickie Epley, MD as PCP - Electrophysiology (Cardiology)  Indicate any recent Medical Services you may have received from other than [facility] providers in the past year (date may be approximate).     Assessment:   This is a routine wellness examination for Seth Perez.  Hearing/Vision screen No results found.  Dietary issues and exercise activities discussed: Current Exercise Habits: The patient does not participate in regular exercise at present, Exercise limited by: orthopedic condition(s)   Goals Addressed   None    Depression Screen    11/28/2022    3:42 PM 06/12/2022    1:40 PM 03/12/2022    2:31 PM 10/24/2021    2:54  PM 10/03/2021    3:51 PM 08/22/2020    2:13 PM 08/05/2018   11:07 AM  PHQ 2/9 Scores  PHQ - 2 Score 0  0 0 2 0 0  PHQ- 9 Score    0 8    Exception Documentation  Patient refusal         Fall Risk    11/28/2022    3:42 PM 06/12/2022    1:39 PM 03/12/2022    2:31 PM 10/24/2021    2:54 PM 10/03/2021    3:50 PM  [place] in the past year? 1 1 0 0 1  Number falls in past yr: 1 1  0 1  Injury with Fall? 1 0  0 0  Risk for fall due to : History of fall(s) History of fall(s) No Fall Risks No Fall Risks History of fall(s)  Follow up Falls prevention discussed Falls evaluation completed Falls evaluation completed Falls evaluation completed Falls evaluation completed    [place]:  Any stairs in or around the home? Yes  If so, are there any without handrails? No  Home free of loose throw rugs in walkways, pet beds, electrical cords, etc? Yes  Adequate lighting in your home to reduce risk of falls? Yes   ASSISTIVE DEVICES UTILIZED TO PREVENT FALLS:  Life alert? No  Use of a cane, walker or w/c? Yes  Grab bars in the bathroom? No  Shower chair or bench in shower? Yes  Elevated toilet seat or a handicapped toilet? Yes    Cognitive Function:    08/04/2017   11:44 AM 08/02/2016   11:31 AM 08/03/2015   11:35 AM  MMSE - Mini Mental State Exam  Orientation to time '5 5 5  '$ Orientation to Place '5 5 5  '$ Registration '3 3 3  '$ Attention/ Calculation 5 5 5  Recall '3 3 3  '$ Language- name 2 objects '2 2 2  '$ Language- repeat '1 1 1  '$ Language- follow 3 step command '3 3 3  '$ Language- read & follow direction '1 1 1  '$ Write a sentence '1 1 1  '$ Copy design '1 1 1  '$ Total score '30 30 30        '$ 11/28/2022    3:52 PM 08/22/2020    2:30 PM 08/11/2019   10:20 AM 08/05/2018   11:14 AM 08/02/2016   11:32 AM  6CIT Screen  What Year? 0 points 0 points 0 points 0 points 0 points  What month? 0 points 0 points 0 points 0 points 0 points  What time? 0 points 0 points 0  points 0 points 0 points  Count back from 20 0 points 0 points 0 points 0 points 0 points  Months in reverse 2 points 0 points 0 points 0 points 0 points  Repeat phrase 2 points      Total Score 4 points        Immunizations Immunization History  Administered Date(s) Administered   Fluad Quad(high Dose 65+) 08/13/2019, 06/12/2022   Influenza Split 06/17/2014   Influenza, High Dose Seasonal PF 08/02/2016, 06/18/2017, 08/05/2018   Influenza,inj,Quad PF,6+ Mos 08/03/2015   Moderna Sars-Covid-2 Vaccination 12/22/2019, 01/19/2020   Pneumococcal Conjugate-13 06/17/2014   Pneumococcal Polysaccharide-23 06/18/2007, 09/29/2017   Pneumococcal-Unspecified 09/28/2006   Tdap 06/23/2014    TDAP status: Up to date  Flu Vaccine status: Up to date  Pneumococcal vaccine status: Up to date  Covid-19 vaccine status: Information provided on how to obtain vaccines.   Qualifies for Shingles Vaccine? Yes   Zostavax completed No   Shingrix Completed?: No.    Education has been provided regarding the importance of this vaccine. Patient has been advised to call insurance company to determine out of pocket expense if they have not yet received this vaccine. Advised may also receive vaccine at local pharmacy or Health Dept. Verbalized acceptance and understanding.  Screening Tests Health Maintenance  Topic Date Due   FOOT EXAM  Never done   OPHTHALMOLOGY EXAM  Never done   Zoster Vaccines- Shingrix (1 of 2) Never done   COVID-19 Vaccine (3 - Moderna risk series) 02/16/2020   HEMOGLOBIN A1C  12/11/2022   Diabetic kidney evaluation - Urine ACR  06/13/2023   Diabetic kidney evaluation - eGFR measurement  06/22/2023   Medicare Annual Wellness (AWV)  11/28/2023   DTaP/Tdap/Td (2 - Td or Tdap) 06/23/2024   Pneumonia Vaccine 50+ Years old  Completed   INFLUENZA VACCINE  Completed   HPV VACCINES  Aged Out    Health Maintenance  Health Maintenance Due  Topic Date Due   FOOT EXAM  Never done    OPHTHALMOLOGY EXAM  Never done   Zoster Vaccines- Shingrix (1 of 2) Never done   COVID-19 Vaccine (3 - Moderna risk series) 02/16/2020    Colorectal cancer screening: No longer required.   Lung Cancer Screening: (Low Dose CT Chest recommended if Age 44-80 years, 30 pack-year currently smoking OR have quit w/in 15years.) does not qualify.    Additional Screening:  Hepatitis C Screening: does not qualify  Vision Screening: Recommended annual ophthalmology exams for early detection of glaucoma and other disorders of the eye. Is the patient up to date with their annual eye exam?  No  Who is the provider or what is the name of the office in which the patient attends annual eye exams? [facility] If pt is not established with a provider, would they like to be referred to a provider to establish care? No pt declined eye exam until he gets his cataracts worked on.  Dental Screening: Recommended annual dental exams for proper oral hygiene  Community Resource Referral / Chronic Care Management: CRR required this visit?  No   CCM required this visit?  No      Plan:     I have personally reviewed and noted the following in the patient's chart:   Medical and social history Use of alcohol, tobacco or illicit drugs  Current medications and supplements including opioid prescriptions. Patient is not currently taking opioid prescriptions. Functional ability and status Nutritional status Physical activity Advanced directives List of other physicians Hospitalizations, surgeries, and ER visits in previous 12 months Vitals Screenings to include cognitive, depression, and falls Referrals and appointments  In addition, I have reviewed and discussed with patient certain preventive protocols, quality metrics, and best practice recommendations. A written personalized care plan for preventive services as well as general preventive health recommendations were provided to patient.     Cannon Kettle, [place]   11/28/2022   Nurse Notes: Total time spent on telephone visit today was 22 minutes

## 2022-11-28 NOTE — Patient Instructions (Signed)
  [name] , Thank you for taking time to come for your Medicare Wellness Visit. I appreciate your ongoing commitment to your health goals. Please review the following plan we discussed and let me know if I can assist you in the future.   These are the goals we discussed:  Goals       Follow up with Primary Care Provider (pt-stated)      As needed        This is a list of the screening recommended for you and due dates:  Health Maintenance  Topic Date Due   Complete foot exam   Never done   Eye exam for diabetics  Never done   Zoster (Shingles) Vaccine (1 of 2) Never done   COVID-19 Vaccine (3 - Moderna risk series) 02/16/2020   Hemoglobin A1C  12/11/2022   Yearly kidney health urinalysis for diabetes  06/13/2023   Yearly kidney function blood test for diabetes  06/22/2023   Medicare Annual Wellness Visit  11/28/2023   DTaP/Tdap/Td vaccine (2 - Td or Tdap) 06/23/2024   Pneumonia Vaccine  Completed   Flu Shot  Completed   HPV Vaccine  Aged Out

## 2022-12-03 ENCOUNTER — Encounter (INDEPENDENT_AMBULATORY_CARE_PROVIDER_SITE_OTHER): Payer: Self-pay

## 2022-12-03 ENCOUNTER — Ambulatory Visit (INDEPENDENT_AMBULATORY_CARE_PROVIDER_SITE_OTHER): Payer: Medicare Other | Admitting: Nurse Practitioner

## 2022-12-03 VITALS — BP 137/72 | HR 62 | Resp 16 | Wt 241.0 lb

## 2022-12-03 DIAGNOSIS — I890 Lymphedema, not elsewhere classified: Secondary | ICD-10-CM

## 2022-12-03 NOTE — Progress Notes (Signed)
History of Present Illness  There is no documented history at this time  Assessments & Plan   There are no diagnoses linked to this encounter.    Additional instructions  Subjective:  Patient presents with venous ulcer of the Bilateral lower extremity.    Procedure:  3 layer unna wrap was placed Bilateral lower extremity.   Plan:   Follow up in one week.  

## 2022-12-06 ENCOUNTER — Other Ambulatory Visit: Payer: Self-pay | Admitting: Internal Medicine

## 2022-12-10 ENCOUNTER — Encounter (INDEPENDENT_AMBULATORY_CARE_PROVIDER_SITE_OTHER): Payer: Self-pay | Admitting: Vascular Surgery

## 2022-12-10 ENCOUNTER — Ambulatory Visit (INDEPENDENT_AMBULATORY_CARE_PROVIDER_SITE_OTHER): Payer: Medicare Other | Admitting: Vascular Surgery

## 2022-12-10 VITALS — BP 136/71 | HR 52 | Resp 16 | Wt 239.0 lb

## 2022-12-10 DIAGNOSIS — J432 Centrilobular emphysema: Secondary | ICD-10-CM

## 2022-12-10 DIAGNOSIS — I890 Lymphedema, not elsewhere classified: Secondary | ICD-10-CM | POA: Diagnosis not present

## 2022-12-10 DIAGNOSIS — I87009 Postthrombotic syndrome without complications of unspecified extremity: Secondary | ICD-10-CM

## 2022-12-10 DIAGNOSIS — E786 Lipoprotein deficiency: Secondary | ICD-10-CM | POA: Diagnosis not present

## 2022-12-10 DIAGNOSIS — E1159 Type 2 diabetes mellitus with other circulatory complications: Secondary | ICD-10-CM

## 2022-12-10 DIAGNOSIS — I2699 Other pulmonary embolism without acute cor pulmonale: Secondary | ICD-10-CM | POA: Diagnosis not present

## 2022-12-10 DIAGNOSIS — E785 Hyperlipidemia, unspecified: Secondary | ICD-10-CM | POA: Diagnosis not present

## 2022-12-10 NOTE — Assessment & Plan Note (Signed)
His wounds have healed.  His swelling is under reasonable control.  We are going to try to come out of the Unna boots and go to compression socks.  If he weeping or worsening swelling and needs to get back into Unna boots, he will call our office.  Otherwise we will see him in about a month.  Still awaiting his lymphedema pump which would be greatly helpful.

## 2022-12-10 NOTE — Progress Notes (Signed)
MRN : [id number]  Seth REISCHL Sr. is a 82 y.o. (June 16, 1941) male who presents with chief complaint of  Chief Complaint  Patient presents with   Follow-up    6 week unna wrap follow up  .  History of Present Illness: Patient returns today in follow up of his leg swelling and lymphedema.  His ulcers have healed.  His swelling is under reasonable control although not perfect.  No fevers or chills or signs of infection.  Current Outpatient Medications  Medication Sig Dispense Refill   albuterol (VENTOLIN HFA) 108 (90 Base) MCG/ACT inhaler Inhale 2 puffs into the lungs every 6 (six) hours as needed for wheezing. 6.7 g 11   amiodarone (PACERONE) 200 MG tablet Take 1 tablet (200 mg total) by mouth daily. 90 tablet 3   apixaban (ELIQUIS) 5 MG TABS tablet Take 1 tablet (5 mg total) by mouth 2 (two) times daily. 60 tablet 0   Cobalamin Combinations (B-12) 1000-400 MCG SUBL Place 1,000 mcg under the tongue daily. 90 tablet 3   FARXIGA 10 MG TABS tablet TAKE 1 TABLET BY MOUTH DAILY BEFORE BREAKFAST. 30 tablet 5   metoprolol succinate (TOPROL-XL) 25 MG 24 hr tablet Take 1 tablet (25 mg total) by mouth in the morning and at bedtime. 180 tablet 3   Potassium 99 MG TABS Take 1 tablet by mouth daily.     simvastatin (ZOCOR) 20 MG tablet TAKE 1 TABLET BY MOUTH EVERYDAY AT BEDTIME 90 tablet 1   telmisartan (MICARDIS) 20 MG tablet TAKE 1 TABLET BY MOUTH EVERY DAY 30 tablet 2   VITAMIN D, CHOLECALCIFEROL, PO Take 5,000 Units by mouth daily.     furosemide (LASIX) 20 MG tablet Take 1 tablet (20 mg total) by mouth daily as needed. (Patient not taking: Reported on 11/28/2022) 30 tablet 3   triamcinolone (KENALOG) 0.025 % ointment Apply 1 application  topically as needed. (Patient not taking: Reported on 11/28/2022)     No current facility-administered medications for this visit.    Past Medical History:  Diagnosis Date   Arthritis    Chicken pox    COPD (chronic obstructive pulmonary disease) ([place])     Pulmonary emboli (HCC)    on Xarelto    Past Surgical History:  Procedure Laterality Date   CARDIOVERSION N/A 02/22/2022   Procedure: CARDIOVERSION;  Surgeon: Wellington Hampshire, MD;  Location: [facility] ORS;  Service: Cardiovascular;  Laterality: N/A;   CARDIOVERSION N/A 06/28/2022   Procedure: CARDIOVERSION;  Surgeon: Minna Merritts, MD;  Location: [facility] ORS;  Service: Cardiovascular;  Laterality: N/A;   CHOLECYSTECTOMY  2008   TONSILLECTOMY AND ADENOIDECTOMY  1947     Social History   Tobacco Use   Smoking status: Some Days    Packs/day: 1.00    Years: 59.00    Additional pack years: 0.00    Total pack years: 59.00    Types: Cigarettes   Smokeless tobacco: Never  Vaping Use   Vaping Use: Never used  Substance Use Topics   Alcohol use: Not Currently    Comment: rare   Drug use: No      Family History  Problem Relation Age of Onset   Arthritis Mother    Lung cancer Brother        was a smoker   Breast cancer Maternal Grandmother    Heart disease Maternal Grandfather    Asthma Father    Emphysema Father    Heart disease Paternal Grandfather  Allergies  Allergen Reactions   Adhesive [Tape] Rash    REVIEW OF SYSTEMS (Negative unless checked) Constitutional: [] Weight loss  [] Fever  [] Chills Cardiac: [] Chest pain   []  Atrial Fibrillation  [] Palpitations   [] Shortness of breath when laying flat   [] Shortness of breath with exertion. [] Shortness of breath at rest Vascular:  [] Pain in legs with walking   [] Pain in legs with standing [] Pain in legs when laying flat   [] Claudication    [] Pain in feet when laying flat    [x] History of DVT   [x] Phlebitis   [x] Swelling in legs   [] Varicose veins   [] Non-healing ulcers Pulmonary:   [] Uses home oxygen   [] Productive cough   [] Hemoptysis   [] Wheeze  [x] COPD   [] Asthma Neurologic:  [] Dizziness   [] Seizures  [] Blackouts [] History of stroke   [] History of TIA  [] Aphasia   [] Temporary Blindness   [] Weakness or numbness in arm    [] Weakness or numbness in leg Musculoskeletal:   [] Joint swelling   [] Joint pain   [] Low back pain  []  History of Knee Replacement [x] Arthritis [] back Surgeries  []  Spinal Stenosis    Hematologic:  [] Easy bruising  [] Easy bleeding   [] Hypercoagulable state   [] Anemic Gastrointestinal:  [] Diarrhea   [] Vomiting  [] Gastroesophageal reflux/heartburn   [] Difficulty swallowing. [] Abdominal pain Genitourinary:  [] Chronic kidney disease   [] Difficult urination  [] Anuric   [] Blood in urine [] Frequent urination  [] Burning with urination   [] Hematuria Skin:  [] Rashes   [] Ulcers [] Wounds Psychological:  [] History of anxiety   []  History of major depression  []  Memory Difficulties   Physical Examination  BP 136/71 (BP Location: Left Arm)   Pulse (!) 52   Resp 16   Wt 239 lb (108.4 kg)   BMI 29.09 kg/m  Gen:  WD/WN, NAD Head: [place]/AT, No temporalis wasting. Ear/Nose/Throat: Hearing grossly intact, nares w/o erythema or drainage Eyes: Conjunctiva clear. Sclera non-icteric Neck: Supple.  Trachea midline Pulmonary:  Good air movement, no use of accessory muscles.  Cardiac: Irregular Vascular:  Vessel Right Left  Radial Palpable Palpable               Musculoskeletal: M/S 5/5 throughout.  No deformity or atrophy.  1-2+ bilateral lower extremity edema.  Previous ulcerations have healed.  Marked stasis dermatitis changes. Neurologic: Sensation grossly intact in extremities.  Symmetrical.  Speech is fluent.  Psychiatric: Judgment intact, Mood & affect appropriate for pt's clinical situation. Dermatologic: No rashes or ulcers noted.  No cellulitis or open wounds.      Labs Recent Results (from the past 2160 hour(s))  Aerobic culture     Status: Abnormal   Collection Time: 10/01/22  2:00 PM  Result Value Ref Range   Aerobic Bacterial Culture Final report (A)    Organism ID, Bacteria Comment (A)     Comment: Methicillin - resistant Staphylococcus aureus Based on resistance to oxacillin this  isolate would be resistant to all currently available beta-lactam antimicrobial agents, with the exception of the newer cephalosporins with anti-MRSA activity, such as Ceftaroline Scant growth    Organism ID, Bacteria Comment (A)     Comment: Stenotrophomonas maltophilia Scant growth    Organism ID, Bacteria Routine flora     Comment: Moderate growth   Antimicrobial Susceptibility Comment     Comment:       ** S = Susceptible; I = Intermediate; R = Resistant **  P = Positive; N = Negative             MICS are expressed in micrograms per mL    Antibiotic                 RSLT#1    RSLT#2    RSLT#3    RSLT#4 Ceftazidime                              I Chloramphenicol                          S Ciprofloxacin                  S Clindamycin                    R Erythromycin                   R Gentamicin                     S Levofloxacin                             S Levofloxacin                   S Linezolid                      S Oxacillin                      R Penicillin                     R Rifampin                       R Tetracycline                   S Trimethoprim/Sulfa                       R Trimethoprim/Sulfa             S Vancomycin                     S     Radiology CT CHEST WO CONTRAST  Result Date: 11/13/2022 CLINICAL DATA:  Follow-up pulmonary nodules.  COPD.  Current smoker. EXAM: CT CHEST WITHOUT CONTRAST TECHNIQUE: Multidetector CT imaging of the chest was performed following the standard protocol without IV contrast. RADIATION DOSE REDUCTION: This exam was performed according to the departmental dose-optimization program which includes automated exposure control, adjustment of the mA and/or kV according to patient size and/or use of iterative reconstruction technique. COMPARISON:  11/24/2019 screening chest CT. FINDINGS: Cardiovascular: Normal heart size. No significant pericardial effusion/thickening. Left anterior descending and left  circumflex coronary atherosclerosis. Atherosclerotic nonaneurysmal thoracic aorta. Dilated main pulmonary artery (3.5 cm diameter). Mediastinum/Nodes: No significant thyroid nodules. Unremarkable esophagus. No pathologically enlarged axillary, mediastinal or hilar lymph nodes, noting limited sensitivity for the detection of hilar adenopathy on this noncontrast study. Lungs/Pleura: No pneumothorax. No pleural effusion. Mild centrilobular emphysema with mild diffuse bronchial wall thickening. No acute consolidative airspace disease or lung masses. Similar parenchymal bands at the posterior lung bases, left greater than right, compatible with  postinfectious/postinflammatory scarring. A few scattered solid pulmonary nodules in both lungs are all stable, largest 0.6 cm in the posterior left upper lobe (series 3/image 31). No new significant pulmonary nodules. Upper abdomen: Cholecystectomy. Musculoskeletal: No aggressive appearing focal osseous lesions. Mild gynecomastia, asymmetric to the right, increased. Moderate thoracic spondylosis. IMPRESSION: 1. Scattered small solid pulmonary nodules, largest 0.6 cm in the posterior left upper lobe, all stable since 11/24/2019 screening chest CT, considered benign. No new significant pulmonary nodules. No imaging follow-up required. 2. Dilated main pulmonary artery, suggesting pulmonary arterial hypertension. 3. Two-vessel coronary atherosclerosis. 4. Mild gynecomastia, asymmetric to the right, increased. 5. Aortic Atherosclerosis (ICD10-I70.0) and Emphysema (ICD10-J43.9). Electronically Signed   By: Ilona Sorrel M.D.   On: 11/13/2022 11:51    Assessment/Plan Hyperlipidemia with low HDL lipid control important in reducing the progression of atherosclerotic disease. Continue statin therapy     COPD (chronic obstructive pulmonary disease) with emphysema (HCC) Continue pulmonary medications and aerosols as already ordered, these medications have been reviewed and there are  no changes at this time.   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   Type 2 diabetes mellitus with vascular disease (HCC) blood glucose control important in reducing the progression of atherosclerotic disease. Also, involved in wound healing. On appropriate medications.  Lymphedema His wounds have healed.  His swelling is under reasonable control.  We are going to try to come out of the Unna boots and go to compression socks.  If he weeping or worsening swelling and needs to get back into Unna boots, he will call our office.  Otherwise we will see him in about a month.  Still awaiting his lymphedema pump which would be greatly helpful.  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  His swelling is under reasonable control.  We are going to try to come out of the Unna boots and go to compression socks.  If he weeping or worsening swelling and needs to get back into Unna boots, he will call our office.  Otherwise we will see him in about a month.  Still awaiting his lymphedema pump which would be greatly helpful.    Leotis Pain, MD  12/10/2022 2:15 PM    This note was created with Dragon medical transcription system.  Any errors from dictation are purely unintentional

## 2022-12-11 ENCOUNTER — Encounter: Payer: Self-pay | Admitting: Internal Medicine

## 2022-12-11 ENCOUNTER — Ambulatory Visit (INDEPENDENT_AMBULATORY_CARE_PROVIDER_SITE_OTHER): Payer: Medicare Other | Admitting: Internal Medicine

## 2022-12-11 VITALS — BP 130/78 | HR 55 | Temp 97.7°F | Ht 76.0 in | Wt 236.2 lb

## 2022-12-11 DIAGNOSIS — J432 Centrilobular emphysema: Secondary | ICD-10-CM

## 2022-12-11 DIAGNOSIS — R062 Wheezing: Secondary | ICD-10-CM | POA: Diagnosis not present

## 2022-12-11 DIAGNOSIS — E1159 Type 2 diabetes mellitus with other circulatory complications: Secondary | ICD-10-CM

## 2022-12-11 DIAGNOSIS — Z7901 Long term (current) use of anticoagulants: Secondary | ICD-10-CM | POA: Diagnosis not present

## 2022-12-11 DIAGNOSIS — I4819 Other persistent atrial fibrillation: Secondary | ICD-10-CM | POA: Diagnosis not present

## 2022-12-11 DIAGNOSIS — R918 Other nonspecific abnormal finding of lung field: Secondary | ICD-10-CM | POA: Diagnosis not present

## 2022-12-11 DIAGNOSIS — I890 Lymphedema, not elsewhere classified: Secondary | ICD-10-CM | POA: Diagnosis not present

## 2022-12-11 DIAGNOSIS — E786 Lipoprotein deficiency: Secondary | ICD-10-CM

## 2022-12-11 DIAGNOSIS — E785 Hyperlipidemia, unspecified: Secondary | ICD-10-CM | POA: Diagnosis not present

## 2022-12-11 LAB — MICROALBUMIN / CREATININE URINE RATIO
Creatinine,U: 89.6 mg/dL
Microalb Creat Ratio: 1.2 mg/g (ref 0.0–30.0)
Microalb, Ur: 1 mg/dL (ref 0.0–1.9)

## 2022-12-11 MED ORDER — APIXABAN 5 MG PO TABS: 5.0000 mg | ORAL_TABLET | Freq: Two times a day (BID) | ORAL | 0 refills | Status: DC

## 2022-12-11 MED ORDER — METHYLPREDNISOLONE ACETATE 40 MG/ML IJ SUSP
40.0000 mg | Freq: Once | INTRAMUSCULAR | Status: AC
  Administered 2022-12-11: 40 mg via INTRAMUSCULAR

## 2022-12-11 NOTE — Progress Notes (Unsigned)
Subjective:  Patient ID: Seth Perez., male    DOB: 09-Aug-1941  Age: 82 y.o. MRN: [id number]  CC: The primary encounter diagnosis was Type 2 diabetes mellitus with vascular disease ([place]). Diagnoses of Hyperlipidemia with low HDL, Encounter for current long-term use of anticoagulants, Wheezing, Centrilobular emphysema ([place]), Lymphedema, Multiple pulmonary nodules determined by computed tomography of lung, and Persistent atrial fibrillation ([place]) were also pertinent to this visit.   HPI Seth Bales Sr. presents for  Chief Complaint  Patient presents with   Medical Management of Chronic Issues    6 month follow up    Saw Dew f yesterday for Lymphedema WITH history of LE stasis ulcers.  Weekly Wraps removed from both legs.  Was having them changed every 3 days when he had wounds ,  but  Wounds have healed .  Pumps ordered ,  has compression socks to wear  until the pumps come.   COPD:  Saw  DR.  Patsey Berthold,  pulmonology in [place] :  PFT s ordered and chest  CT repeated in Feb 2024.  Stable since 2021 .Marland Kitchen  Not using advair or Incruse Ellipta    CAD:   taking Farxiga for 2 vessel disease.  OOP cost is $91 for #30 days ;  initially $140 /month.  Taking Eliquis but has a surplus supply   Not having trouble paying for meds at this point   Weight loss of 16 lb since September.  Thighs feel less tight .  Also eating less per meal.  Denies abd pain.  Dinner by 5 pm   has been having one loose stool daily with fecal urgency  fo rthe past seeral years,   declines GI referral .      Outpatient Medications Prior to Visit  Medication Sig Dispense Refill   albuterol (VENTOLIN HFA) 108 (90 Base) MCG/ACT inhaler Inhale 2 puffs into the lungs every 6 (six) hours as needed for wheezing. 6.7 g 11   amiodarone (PACERONE) 200 MG tablet Take 1 tablet (200 mg total) by mouth daily. 90 tablet 3   Cobalamin Combinations (B-12) 1000-400 MCG SUBL Place 1,000 mcg under the tongue daily. 90 tablet 3   FARXIGA  10 MG TABS tablet TAKE 1 TABLET BY MOUTH DAILY BEFORE BREAKFAST. 30 tablet 5   furosemide (LASIX) 20 MG tablet Take 20 mg by mouth daily as needed. 30 tablet 3   metoprolol succinate (TOPROL-XL) 25 MG 24 hr tablet Take 1 tablet (25 mg total) by mouth in the morning and at bedtime. 180 tablet 3   Potassium 99 MG TABS Take 1 tablet by mouth daily.     simvastatin (ZOCOR) 20 MG tablet TAKE 1 TABLET BY MOUTH EVERYDAY AT BEDTIME 90 tablet 1   telmisartan (MICARDIS) 20 MG tablet TAKE 1 TABLET BY MOUTH EVERY DAY 30 tablet 2   triamcinolone (KENALOG) 0.025 % ointment Apply 1 application  topically as needed.     VITAMIN D, CHOLECALCIFEROL, PO Take 5,000 Units by mouth daily.     apixaban (ELIQUIS) 5 MG TABS tablet Take 1 tablet (5 mg total) by mouth 2 (two) times daily. 60 tablet 0   No facility-administered medications prior to visit.    Review of Systems;  Patient denies headache, fevers, malaise, unintentional weight loss, skin rash, eye pain, sinus congestion and sinus pain, sore throat, dysphagia,  hemoptysis , cough, dyspnea, wheezing, chest pain, palpitations, orthopnea, edema, abdominal pain, nausea, melena, diarrhea, constipation, flank pain, dysuria, hematuria, urinary  Frequency, nocturia, numbness, tingling, seizures,  Focal weakness, Loss of consciousness,  Tremor, insomnia, depression, anxiety, and suicidal ideation.      Objective:  BP 130/78   Pulse (!) 55   Temp 97.7 F (36.5 C) (Oral)   Ht 6\' 4"  (1.93 m)   Wt 236 lb 3.2 oz (107.1 kg)   SpO2 95%   BMI 28.75 kg/m   BP Readings from Last 3 Encounters:  12/11/22 130/78  12/10/22 136/71  12/03/22 137/72    Wt Readings from Last 3 Encounters:  12/11/22 236 lb 3.2 oz (107.1 kg)  12/10/22 239 lb (108.4 kg)  12/03/22 241 lb (109.3 kg)    Physical Exam Vitals reviewed.  Constitutional:      General: He is not in acute distress.    Appearance: Normal appearance. He is normal weight. He is not ill-appearing,  toxic-appearing or diaphoretic.  HENT:     Head: Normocephalic.  Eyes:     General: No scleral icterus.       Right eye: No discharge.        Left eye: No discharge.     Conjunctiva/sclera: Conjunctivae normal.  Cardiovascular:     Rate and Rhythm: Normal rate and regular rhythm.     Heart sounds: Normal heart sounds.  Pulmonary:     Effort: Pulmonary effort is normal. No tachypnea, prolonged expiration or respiratory distress.     Breath sounds: Examination of the right-upper field reveals wheezing. Examination of the left-upper field reveals wheezing. Examination of the right-lower field reveals wheezing. Examination of the left-lower field reveals wheezing. Wheezing present.  Musculoskeletal:        General: Normal range of motion.     Cervical back: Normal range of motion.  Skin:    General: Skin is warm and dry.  Neurological:     General: No focal deficit present.     Mental Status: He is alert and oriented to person, place, and time. Mental status is at baseline.  Psychiatric:        Mood and Affect: Mood normal.        Behavior: Behavior normal.        Thought Content: Thought content normal.        Judgment: Judgment normal.    Lab Results  Component Value Date   HGBA1C 5.3 06/12/2022   HGBA1C 5.7 03/12/2022   HGBA1C 5.5 09/22/2017    Lab Results  Component Value Date   CREATININE 1.07 12/11/2022   CREATININE 1.19 06/21/2022   CREATININE 1.06 06/03/2022    Lab Results  Component Value Date   WBC 6.7 12/11/2022   HGB 15.9 12/11/2022   HCT 47.3 12/11/2022   PLT 168.0 12/11/2022   GLUCOSE 90 12/11/2022   CHOL 141 12/11/2022   TRIG 98.0 12/11/2022   HDL 47.10 12/11/2022   LDLDIRECT 75.0 12/11/2022   LDLCALC 75 12/11/2022   ALT 16 12/11/2022   AST 17 12/11/2022   NA 141 12/11/2022   K 4.4 12/11/2022   CL 101 12/11/2022   CREATININE 1.07 12/11/2022   BUN 20 12/11/2022   CO2 32 12/11/2022   TSH 1.926 08/28/2022   PSA 11.44 (H) 11/24/2012   INR 1.5  01/26/2014   HGBA1C 5.3 06/12/2022   MICROALBUR 1.0 12/11/2022    CT CHEST WO CONTRAST  Result Date: 11/13/2022 CLINICAL DATA:  Follow-up pulmonary nodules.  COPD.  Current smoker. EXAM: CT CHEST WITHOUT CONTRAST TECHNIQUE: Multidetector CT imaging of the chest was performed following the standard  protocol without IV contrast. RADIATION DOSE REDUCTION: This exam was performed according to the departmental dose-optimization program which includes automated exposure control, adjustment of the mA and/or kV according to patient size and/or use of iterative reconstruction technique. COMPARISON:  11/24/2019 screening chest CT. FINDINGS: Cardiovascular: Normal heart size. No significant pericardial effusion/thickening. Left anterior descending and left circumflex coronary atherosclerosis. Atherosclerotic nonaneurysmal thoracic aorta. Dilated main pulmonary artery (3.5 cm diameter). Mediastinum/Nodes: No significant thyroid nodules. Unremarkable esophagus. No pathologically enlarged axillary, mediastinal or hilar lymph nodes, noting limited sensitivity for the detection of hilar adenopathy on this noncontrast study. Lungs/Pleura: No pneumothorax. No pleural effusion. Mild centrilobular emphysema with mild diffuse bronchial wall thickening. No acute consolidative airspace disease or lung masses. Similar parenchymal bands at the posterior lung bases, left greater than right, compatible with postinfectious/postinflammatory scarring. A few scattered solid pulmonary nodules in both lungs are all stable, largest 0.6 cm in the posterior left upper lobe (series 3/image 31). No new significant pulmonary nodules. Upper abdomen: Cholecystectomy. Musculoskeletal: No aggressive appearing focal osseous lesions. Mild gynecomastia, asymmetric to the right, increased. Moderate thoracic spondylosis. IMPRESSION: 1. Scattered small solid pulmonary nodules, largest 0.6 cm in the posterior left upper lobe, all stable since 11/24/2019  screening chest CT, considered benign. No new significant pulmonary nodules. No imaging follow-up required. 2. Dilated main pulmonary artery, suggesting pulmonary arterial hypertension. 3. Two-vessel coronary atherosclerosis. 4. Mild gynecomastia, asymmetric to the right, increased. 5. Aortic Atherosclerosis (ICD10-I70.0) and Emphysema (ICD10-J43.9). Electronically Signed   By: Ilona Sorrel M.D.   On: 11/13/2022 11:51    Assessment & Plan:  .Type 2 diabetes mellitus with vascular disease (HCC) -     Microalbumin / creatinine urine ratio -     Comprehensive metabolic panel  Hyperlipidemia with low HDL -     Lipid panel -     LDL cholesterol, direct  Encounter for current long-term use of anticoagulants -     CBC with Differential/Platelet  Wheezing -     methylPREDNISolone Acetate  Centrilobular emphysema (HCC) Assessment & Plan: He is wheezing on exam today but denies shortness of breath.  IM steroid injection given.  Advised to resume steroid/bronchodilator therapy   Lymphedema Assessment & Plan: He was taken out of Unnas boots yesterday but has not started wearing compression stockings yet,  and has already developed significant edema. Advised to start wearing  stockings UNTIL BEDTIME only.  r Still awaiting his lymphedema pump which would be greatly helpful.   Multiple pulmonary nodules determined by computed tomography of lung Assessment & Plan: Continue  annual follow up for unchanged nodules   Persistent atrial fibrillation [facility]) Assessment & Plan: S/p unsuccessful cardioversion in July,  With repeat done in early October.  Continue amiodarone and metoprolol .   He is anticoagulated with Eliquis   Other orders -     Apixaban; Take 1 tablet (5 mg total) by mouth 2 (two) times daily.  Dispense: 60 tablet; Refill: 0     Follow-up: No follow-ups on file.   [name], MD

## 2022-12-11 NOTE — Patient Instructions (Signed)
You are wheezing today,  SO I HAVE GIVEN YOU A SHOT OF STEROID (DEPO MEDROL)  Please resume ADVAIR AND INCRUSE ELLIPTA on  Regular basis  to keep you from wheezing

## 2022-12-12 LAB — COMPREHENSIVE METABOLIC PANEL
ALT: 16 U/L (ref 0–53)
AST: 17 U/L (ref 0–37)
Albumin: 4.5 g/dL (ref 3.5–5.2)
Alkaline Phosphatase: 60 U/L (ref 39–117)
BUN: 20 mg/dL (ref 6–23)
CO2: 32 mEq/L (ref 19–32)
Calcium: 9.6 mg/dL (ref 8.4–10.5)
Chloride: 101 mEq/L (ref 96–112)
Creatinine, Ser: 1.07 mg/dL (ref 0.40–1.50)
GFR: 64.53 mL/min (ref >60.00)
Glucose, Bld: 90 mg/dL (ref 70–99)
Potassium: 4.4 mEq/L (ref 3.5–5.1)
Sodium: 141 mEq/L (ref 135–145)
Total Bilirubin: 0.8 mg/dL (ref 0.2–1.2)
Total Protein: 7.4 g/dL (ref 6.0–8.3)

## 2022-12-12 LAB — LIPID PANEL
Cholesterol: 141 mg/dL (ref 0–200)
HDL: 47.1 mg/dL (ref >39.00)
LDL Cholesterol: 75 mg/dL (ref 0–99)
NonHDL: 94.29
Total CHOL/HDL Ratio: 3
Triglycerides: 98 mg/dL (ref 0.0–149.0)
VLDL: 19.6 mg/dL (ref 0.0–40.0)

## 2022-12-12 LAB — CBC WITH DIFFERENTIAL/PLATELET
Basophils Absolute: 0 10*3/uL (ref 0.0–0.1)
Basophils Relative: 0.4 % (ref 0.0–3.0)
Eosinophils Absolute: 0 10*3/uL (ref 0.0–0.7)
Eosinophils Relative: 0.7 % (ref 0.0–5.0)
HCT: 47.3 % (ref 39.0–52.0)
Hemoglobin: 15.9 g/dL (ref 13.0–17.0)
Lymphocytes Relative: 37.6 % (ref 12.0–46.0)
Lymphs Abs: 2.5 10*3/uL (ref 0.7–4.0)
MCHC: 33.6 g/dL (ref 30.0–36.0)
MCV: 95.7 fl (ref 78.0–100.0)
Monocytes Absolute: 0.7 10*3/uL (ref 0.1–1.0)
Monocytes Relative: 10.4 % (ref 3.0–12.0)
Neutro Abs: 3.4 10*3/uL (ref 1.4–7.7)
Neutrophils Relative %: 50.9 % (ref 43.0–77.0)
Platelets: 168 10*3/uL (ref 150.0–400.0)
RBC: 4.95 Mil/uL (ref 4.22–5.81)
RDW: 13.3 % (ref 11.5–15.5)
WBC: 6.7 10*3/uL (ref 4.0–10.5)

## 2022-12-12 LAB — LDL CHOLESTEROL, DIRECT: Direct LDL: 75 mg/dL

## 2022-12-12 NOTE — Assessment & Plan Note (Signed)
Continue  annual follow up for unchanged nodules

## 2022-12-12 NOTE — Assessment & Plan Note (Signed)
S/p unsuccessful cardioversion in July,  With repeat done in early October.  Continue amiodarone and metoprolol .   He is anticoagulated with Eliquis

## 2022-12-12 NOTE — Assessment & Plan Note (Signed)
He is wheezing on exam today but denies shortness of breath.  IM steroid injection given.  Advised to resume steroid/bronchodilator therapy

## 2022-12-12 NOTE — Assessment & Plan Note (Signed)
He was taken out of Unnas boots yesterday but has not started wearing compression stockings yet,  and has already developed significant edema. Advised to start wearing  stockings UNTIL BEDTIME only.  r Still awaiting his lymphedema pump which would be greatly helpful.

## 2023-01-12 NOTE — Progress Notes (Deleted)
Cardiology Office Note  Date:  01/12/2023   ID:  Seth NIEMAN Sr., DOB Feb 19, 1941, MRN [id number]  PCP:  Sherlene Shams, MD   No chief complaint on file.   HPI:  Seth Perez. is a 82 y.o. male with a hx of  coronary artery calcification,  persistent A-fib status post DCCV on 02/22/2022 on Eliquis,  HFpEF,  recurrent PE status post IVC filter and on anticoagulation,  COPD,  chronic lower extremity swelling,  LBBB  Echo June 2023 EF 45 to 50% Who presents for follow-up of his atrial fibrillation, chronic lower extremity swelling   last seen by myself in clinic October 2023 Seen by EP December 2023   A-fib in the ED on 10/24/2021.   on anticoagulation at that time given history of recurrent PE.   Seen by Dr. Lalla Brothers in 01/2022,  successful DCCV with a 200 J shock on 02/22/2022.    in follow-up 03/04/22  in NSR.    Echo showed LVEF 45-50%, global HK, mildly dilated RA, mild Aortic root dilation.   05/06/22  in rate controlled afib and sent to EP.   Myoview lexiscan was ordered to evaluate for ischemia for reduced EF.  Myoview Lexiscan showed no ischemia or scar, no significant coronary artery calcification, mild aortic atherosclerosis, overall low risk.    He saw EP 05/29/22 and was in afib at the time.   started on amiodarone and set up for repeat cardioversion.  Repeat cardioversion June 28, 2022   UNNA boots that is changed out by Vascular.   Fall last week, mechanical, sore ribs, skinned his head Walks with a cane x2  Dr. Wyn Quaker placed b/l leg wraps recently Not taking lasix, concerned it might push his blood pressure low Leg swelling  down over past month, thinks it is because he is in normal sinus rhythym  EKG personally reviewed by myself on todays visit NSR rate 74 bpm left bundle branch block  PMH:   has a past medical history of Arthritis, Chicken pox, COPD (chronic obstructive pulmonary disease) (HCC), Pulmonary emboli (HCC), and Type 2 diabetes mellitus  with vascular disease (HCC) (06/12/2022).  PSH:    Past Surgical History:  Procedure Laterality Date   CARDIOVERSION N/A 02/22/2022   Procedure: CARDIOVERSION;  Surgeon: Iran Ouch, MD;  Location: [facility] ORS;  Service: Cardiovascular;  Laterality: N/A;   CARDIOVERSION N/A 06/28/2022   Procedure: CARDIOVERSION;  Surgeon: Antonieta Iba, MD;  Location: [facility] ORS;  Service: Cardiovascular;  Laterality: N/A;   CHOLECYSTECTOMY  2008   TONSILLECTOMY AND ADENOIDECTOMY  1947    Current Outpatient Medications  Medication Sig Dispense Refill   albuterol (VENTOLIN HFA) 108 (90 Base) MCG/ACT inhaler Inhale 2 puffs into the lungs every 6 (six) hours as needed for wheezing. 6.7 g 11   amiodarone (PACERONE) 200 MG tablet Take 1 tablet (200 mg total) by mouth daily. 90 tablet 3   apixaban (ELIQUIS) 5 MG TABS tablet Take 1 tablet (5 mg total) by mouth 2 (two) times daily. 60 tablet 0   Cobalamin Combinations (B-12) 1000-400 MCG SUBL Place 1,000 mcg under the tongue daily. 90 tablet 3   FARXIGA 10 MG TABS tablet TAKE 1 TABLET BY MOUTH DAILY BEFORE BREAKFAST. 30 tablet 5   furosemide (LASIX) 20 MG tablet Take 20 mg by mouth daily as needed. 30 tablet 3   metoprolol succinate (TOPROL-XL) 25 MG 24 hr tablet Take 1 tablet (25 mg total) by mouth in the morning and  at bedtime. 180 tablet 3   Potassium 99 MG TABS Take 1 tablet by mouth daily.     simvastatin (ZOCOR) 20 MG tablet TAKE 1 TABLET BY MOUTH EVERYDAY AT BEDTIME 90 tablet 1   telmisartan (MICARDIS) 20 MG tablet TAKE 1 TABLET BY MOUTH EVERY DAY 30 tablet 2   triamcinolone (KENALOG) 0.025 % ointment Apply 1 application  topically as needed.     VITAMIN D, CHOLECALCIFEROL, PO Take 5,000 Units by mouth daily.     No current facility-administered medications for this visit.    Allergies:   Adhesive [tape]   Social History:  The patient  reports that he has been smoking cigarettes. He has a 59.00 pack-year smoking history. He has never used smokeless  tobacco. He reports that he does not currently use alcohol. He reports that he does not use drugs.   Family History:   family history includes Arthritis in his mother; Asthma in his father; Breast cancer in his maternal grandmother; Emphysema in his father; Heart disease in his maternal grandfather and paternal grandfather; Lung cancer in his brother.    Review of Systems: Review of Systems  Constitutional: Negative.   HENT: Negative.    Respiratory: Negative.    Cardiovascular: Negative.   Gastrointestinal: Negative.   Musculoskeletal: Negative.   Neurological: Negative.   Psychiatric/Behavioral: Negative.    All other systems reviewed and are negative.  PHYSICAL EXAM: VS:  There were no vitals taken for this visit. , BMI There is no height or weight on file to calculate BMI. GEN: Well nourished, well developed, in no acute distress,, in wheelchair HEENT: normal Neck: no JVD, carotid bruits, or masses Cardiac: RRR; no murmurs, rubs, or gallops,no edema  Respiratory:  clear to auscultation bilaterally, normal work of breathing GI: soft, nontender, nondistended, + BS MS: no deformity or atrophy Skin: warm and dry, no rash, bruising on face Neuro:  Strength and sensation are intact Psych: euthymic mood, full affect  Recent Labs: 08/28/2022: TSH 1.926 12/11/2022: ALT 16; BUN 20; Creatinine, Ser 1.07; Hemoglobin 15.9; Platelets 168.0; Potassium 4.4; Sodium 141    Lipid Panel Lab Results  Component Value Date   CHOL 141 12/11/2022   HDL 47.10 12/11/2022   LDLCALC 75 12/11/2022   TRIG 98.0 12/11/2022      Wt Readings from Last 3 Encounters:  12/11/22 236 lb 3.2 oz (107.1 kg)  12/10/22 239 lb (108.4 kg)  12/03/22 241 lb (109.3 kg)     ASSESSMENT AND PLAN:  Problem List Items Addressed This Visit   None Persistent atrial fibrillation Has had 2 cardioversions, Holding now on amiodarone, metoprolol, Eliquis  in normal sinus rhythm noted on EKG today Likely improved  hemodynamics, leg swelling improving Has not been taking Lasix  Chronic lower extremity swelling he feels he has improved leg swelling and normal sinus rhythm Not taking Lasix at home, very sparingly  History of DVT, PE On Eliquis 5 twice daily  Hyperlipidemia Simvastatin 20 mg daily  Essential hypertension Blood pressure running low today, given high fall risk we will decrease telmisartan down to 10 mg daily Wife reports he has not had much to eat or drink in the past 2 days Suggested he increase his fluid intake  Hypoxia  saturations high 80s low 90s Likely from recent rib trauma following fall, taking short breaths No respiratory distress noted on clinical exam He has a pulse oximeter at home, recommended he closely monitor his numbers and call us if numbers continues to stay  in the high 80s or lower   Total encounter time more than 30 minutes  Greater than 50% was spent in counseling and coordination of care with the patient    Signed, Dossie Arbour, M.D., Ph.D. [facility] [place], [place] [phone]

## 2023-01-13 ENCOUNTER — Ambulatory Visit: Payer: Medicare Other | Admitting: Cardiovascular Disease

## 2023-01-13 DIAGNOSIS — E1159 Type 2 diabetes mellitus with other circulatory complications: Secondary | ICD-10-CM

## 2023-01-13 DIAGNOSIS — E785 Hyperlipidemia, unspecified: Secondary | ICD-10-CM

## 2023-01-13 DIAGNOSIS — Z79899 Other long term (current) drug therapy: Secondary | ICD-10-CM

## 2023-01-13 DIAGNOSIS — I4819 Other persistent atrial fibrillation: Secondary | ICD-10-CM

## 2023-01-13 DIAGNOSIS — I5022 Chronic systolic (congestive) heart failure: Secondary | ICD-10-CM

## 2023-01-13 DIAGNOSIS — I878 Other specified disorders of veins: Secondary | ICD-10-CM

## 2023-01-13 DIAGNOSIS — I429 Cardiomyopathy, unspecified: Secondary | ICD-10-CM

## 2023-01-13 DIAGNOSIS — Z86718 Personal history of other venous thrombosis and embolism: Secondary | ICD-10-CM

## 2023-01-21 ENCOUNTER — Ambulatory Visit (INDEPENDENT_AMBULATORY_CARE_PROVIDER_SITE_OTHER): Payer: Medicare Other | Admitting: Vascular Surgery

## 2023-01-21 VITALS — BP 110/71 | HR 81 | Resp 18 | Ht 76.0 in | Wt 240.0 lb

## 2023-01-21 DIAGNOSIS — I878 Other specified disorders of veins: Secondary | ICD-10-CM

## 2023-01-21 DIAGNOSIS — E785 Hyperlipidemia, unspecified: Secondary | ICD-10-CM | POA: Diagnosis not present

## 2023-01-21 DIAGNOSIS — I890 Lymphedema, not elsewhere classified: Secondary | ICD-10-CM

## 2023-01-21 DIAGNOSIS — J441 Chronic obstructive pulmonary disease with (acute) exacerbation: Secondary | ICD-10-CM | POA: Diagnosis not present

## 2023-01-21 DIAGNOSIS — E663 Overweight: Secondary | ICD-10-CM

## 2023-01-21 DIAGNOSIS — I87009 Postthrombotic syndrome without complications of unspecified extremity: Secondary | ICD-10-CM

## 2023-01-21 DIAGNOSIS — E786 Lipoprotein deficiency: Secondary | ICD-10-CM | POA: Diagnosis not present

## 2023-01-21 NOTE — Assessment & Plan Note (Signed)
Much worse with blistering.  UNNA boots to be placed on both LE today and changed weekly. This is a long term problem.  Recheck in 6 weeks.

## 2023-01-21 NOTE — Progress Notes (Signed)
MRN : [id number]  Seth GOSSMAN Sr. is a 82 y.o. (Mar 11, 1941) male who presents with chief complaint of  Chief Complaint  Patient presents with   Follow-up  .  History of Present Illness: Patient returns today in follow up of his lymphedema and leg swelling.  Swelling is much worse.  He has had copious serous drainage from the right leg and a smaller amount of serous drainage from the left leg.  He has blistering on both legs and marked swelling.  Current Outpatient Medications  Medication Sig Dispense Refill   albuterol (VENTOLIN HFA) 108 (90 Base) MCG/ACT inhaler Inhale 2 puffs into the lungs every 6 (six) hours as needed for wheezing. 6.7 g 11   amiodarone (PACERONE) 200 MG tablet Take 1 tablet (200 mg total) by mouth daily. 90 tablet 3   apixaban (ELIQUIS) 5 MG TABS tablet Take 1 tablet (5 mg total) by mouth 2 (two) times daily. 60 tablet 0   Cobalamin Combinations (B-12) 1000-400 MCG SUBL Place 1,000 mcg under the tongue daily. 90 tablet 3   FARXIGA 10 MG TABS tablet TAKE 1 TABLET BY MOUTH DAILY BEFORE BREAKFAST. 30 tablet 5   furosemide (LASIX) 20 MG tablet Take 20 mg by mouth daily as needed. 30 tablet 3   metoprolol succinate (TOPROL-XL) 25 MG 24 hr tablet Take 1 tablet (25 mg total) by mouth in the morning and at bedtime. 180 tablet 3   Potassium 99 MG TABS Take 1 tablet by mouth daily.     simvastatin (ZOCOR) 20 MG tablet TAKE 1 TABLET BY MOUTH EVERYDAY AT BEDTIME 90 tablet 1   telmisartan (MICARDIS) 20 MG tablet TAKE 1 TABLET BY MOUTH EVERY DAY 30 tablet 2   triamcinolone (KENALOG) 0.025 % ointment Apply 1 application  topically as needed.     VITAMIN D, CHOLECALCIFEROL, PO Take 5,000 Units by mouth daily.     No current facility-administered medications for this visit.    Past Medical History:  Diagnosis Date   Arthritis    Chicken pox    COPD (chronic obstructive pulmonary disease) (HCC)    Pulmonary emboli (HCC)    on Xarelto   Type 2 diabetes mellitus with  vascular disease (HCC) 06/12/2022    Past Surgical History:  Procedure Laterality Date   CARDIOVERSION N/A 02/22/2022   Procedure: CARDIOVERSION;  Surgeon: Iran Ouch, MD;  Location: [facility];  Service: Cardiovascular;  Laterality: N/A;   CARDIOVERSION N/A 06/28/2022   Procedure: CARDIOVERSION;  Surgeon: Antonieta Iba, MD;  Location: [facility] OR[facility];  Service: Cardiovascular;  Laterality: N/A;   CHOLECYSTECTOMY  2008   TONSILLECTOMY AND ADENOIDECTOMY  1947     Social History   Tobacco Use   Smoking status: Some Days    Packs/day: 1.00    Years: 59.00    Additional pack years: 0.00    Total pack years: 59.00    Types: Cigarettes   Smokeless tobacco: Never  Vaping Use   Vaping Use: Never used  Substance Use Topics   Alcohol use: Not Currently    Comment: rare   Drug use: No      Family History  Problem Relation Age of Onset   Arthritis Mother    Lung cancer Brother        was a smoker   Breast cancer Maternal Grandmother    Heart disease Maternal Grandfather    Asthma Father    Emphysema Father    Heart disease Paternal Grandfather  Allergies  Allergen Reactions   Adhesive [Tape] Rash    REVIEW OF SYSTEMS (Negative unless checked) Constitutional: [] Weight loss  [] Fever  [] Chills Cardiac: [] Chest pain   []  Atrial Fibrillation  [] Palpitations   [] Shortness of breath when laying flat   [] Shortness of breath with exertion. [] Shortness of breath at rest Vascular:  [] Pain in legs with walking   [] Pain in legs with standing [] Pain in legs when laying flat   [] Claudication    [] Pain in feet when laying flat    [x] History of DVT   [x] Phlebitis   [x] Swelling in legs   [] Varicose veins   [] Non-healing ulcers Pulmonary:   [] Uses home oxygen   [] Productive cough   [] Hemoptysis   [] Wheeze  [x] COPD   [] Asthma Neurologic:  [] Dizziness   [] Seizures  [] Blackouts [] History of stroke   [] History of TIA  [] Aphasia   [] Temporary Blindness   [] Weakness or numbness in arm    [] Weakness or numbness in leg Musculoskeletal:   [] Joint swelling   [] Joint pain   [] Low back pain  []  History of Knee Replacement [x] Arthritis [] back Surgeries  []  Spinal Stenosis    Hematologic:  [] Easy bruising  [] Easy bleeding   [] Hypercoagulable state   [] Anemic Gastrointestinal:  [] Diarrhea   [] Vomiting  [] Gastroesophageal reflux/heartburn   [] Difficulty swallowing. [] Abdominal pain Genitourinary:  [] Chronic kidney disease   [] Difficult urination  [] Anuric   [] Blood in urine [] Frequent urination  [] Burning with urination   [] Hematuria Skin:  [] Rashes   [] Ulcers [] Wounds Psychological:  [] History of anxiety   []  History of major depression  []  Memory Difficulties   Physical Examination  BP 110/71 (BP Location: Left Arm)   Pulse 81   Resp 18   Ht 6\' 4"  (1.93 m)   Wt 240 lb (108.9 kg)   BMI 29.21 kg/m  Gen:  WD/WN, NAD Head: [place]/AT, No temporalis wasting. Ear/Nose/Throat: Hearing grossly intact, nares w/o erythema or drainage Eyes: Conjunctiva clear. Sclera non-icteric Neck: Supple.  Trachea midline Pulmonary:  Good air movement, no use of accessory muscles.  Cardiac: irregular Vascular:  Vessel Right Left  Radial Palpable Palpable                   Musculoskeletal: M/S 5/5 throughout.  No deformity or atrophy. 3+ BLE edema.  Significant serous drainage more on the right than the left.  Blistering present. Neurologic: Sensation grossly intact in extremities.  Symmetrical.  Speech is fluent.  Psychiatric: Judgment intact, Mood & affect appropriate for pt's clinical situation. Dermatologic: No rashes or ulcers noted.  No cellulitis or open wounds.      Labs Recent Results (from the past 2160 hour(s))  Urine Microalbumin w/creat. ratio     Status: None   Collection Time: 12/11/22  2:15 PM  Result Value Ref Range   Microalb, Ur 1.0 0.0 - 1.9 mg/dL   Creatinine,U 16.1 mg/dL   Microalb Creat Ratio 1.2 0.0 - 30.0 mg/g  Comp Met (CMET)     Status: None   Collection Time:  12/11/22  2:15 PM  Result Value Ref Range   Sodium 141 135 - 145 mEq/L   Potassium 4.4 3.5 - 5.1 mEq/L   Chloride 101 96 - 112 mEq/L   CO2 32 19 - 32 mEq/L   Glucose, Bld 90 70 - 99 mg/dL   BUN 20 6 - 23 mg/dL   Creatinine, Ser 0.96 0.40 - 1.50 mg/dL   Total Bilirubin 0.8 0.2 - 1.2 mg/dL   Alkaline Phosphatase 60  39 - 117 U/L   AST 17 0 - 37 U/L   ALT 16 0 - 53 U/L   Total Protein 7.4 6.0 - 8.3 g/dL   Albumin 4.5 3.5 - 5.2 g/dL   GFR 16.10 >96.04 mL/min    Comment: Calculated using the CKD-EPI Creatinine Equation (2021)   Calcium 9.6 8.4 - 10.5 mg/dL  Lipid Profile     Status: None   Collection Time: 12/11/22  2:15 PM  Result Value Ref Range   Cholesterol 141 0 - 200 mg/dL    Comment: ATP III Classification       Desirable:  < 200 mg/dL               Borderline High:  200 - 239 mg/dL          High:  > = 540 mg/dL   Triglycerides 98.1 0.0 - 149.0 mg/dL    Comment: Normal:  <191 mg/dLBorderline High:  150 - 199 mg/dL   HDL 47.82 >95.62 mg/dL   VLDL 13.0 0.0 - 86.5 mg/dL   LDL Cholesterol 75 0 - 99 mg/dL   Total CHOL/HDL Ratio 3     Comment:                Men          Women1/2 Average Risk     3.4          3.3Average Risk          5.0          4.42X Average Risk          9.6          7.13X Average Risk          15.0          11.0                       NonHDL 94.29     Comment: NOTE:  Non-HDL goal should be 30 mg/dL higher than patient's LDL goal (i.e. LDL goal of < 70 mg/dL, would have non-HDL goal of < 100 mg/dL)  Direct LDL     Status: None   Collection Time: 12/11/22  2:15 PM  Result Value Ref Range   Direct LDL 75.0 mg/dL    Comment: Optimal:  <784 mg/dLNear or Above Optimal:  100-129 mg/dLBorderline High:  130-159 mg/dLHigh:  160-189 mg/dLVery High:  >190 mg/dL  CBC with Differential/Platelet     Status: None   Collection Time: 12/11/22  2:15 PM  Result Value Ref Range   WBC 6.7 4.0 - 10.5 K/uL   RBC 4.95 4.22 - 5.81 Mil/uL   Hemoglobin 15.9 13.0 - 17.0 g/dL   HCT 69.6  29.5 - 28.4 %   MCV 95.7 78.0 - 100.0 fl   MCHC 33.6 30.0 - 36.0 g/dL   RDW 13.2 44.0 - 10.2 %   Platelets 168.0 150.0 - 400.0 K/uL   Neutrophils Relative % 50.9 43.0 - 77.0 %   Lymphocytes Relative 37.6 12.0 - 46.0 %   Monocytes Relative 10.4 3.0 - 12.0 %   Eosinophils Relative 0.7 0.0 - 5.0 %   Basophils Relative 0.4 0.0 - 3.0 %   Neutro Abs 3.4 1.4 - 7.7 K/uL   Lymphs Abs 2.5 0.7 - 4.0 K/uL   Monocytes Absolute 0.7 0.1 - 1.0 K/uL   Eosinophils Absolute 0.0 0.0 - 0.7 K/uL   Basophils Absolute 0.0 0.0 - 0.1 K/uL  Radiology No results found.  Assessment/Plan Hyperlipidemia with low HDL lipid control important in reducing the progression of atherosclerotic disease. Continue statin therapy     COPD (chronic obstructive pulmonary disease) with emphysema (HCC) Continue pulmonary medications and aerosols as already ordered, these medications have been reviewed and there are no changes at this time.   Recurrent pulmonary embolism (HCC) Remains on anticoagulation with filter in place.   Type 2 diabetes mellitus with vascular disease (HCC) blood glucose control important in reducing the progression of atherosclerotic disease. Also, involved in wound healing. On appropriate medications.  Lymphedema Much worse with blistering.  UNNA boots to be placed on both LE today and changed weekly. This is a long term problem.  Recheck in 6 weeks.    Lower extremity venous stasis Much worse with blistering.  UNNA boots to be placed on both LE today and changed weekly. This is a long term problem.  Recheck in 6 weeks.      Festus Barren, MD  01/21/2023 2:04 PM    This note was created with Dragon medical transcription system.  Any errors from dictation are purely unintentional

## 2023-01-21 NOTE — Assessment & Plan Note (Signed)
Much worse with blistering.  UNNA boots to be placed on both LE today and changed weekly. This is a long term problem.  Recheck in 6 weeks.   

## 2023-01-28 ENCOUNTER — Ambulatory Visit (INDEPENDENT_AMBULATORY_CARE_PROVIDER_SITE_OTHER): Payer: Medicare Other | Admitting: Nurse Practitioner

## 2023-01-28 ENCOUNTER — Encounter (INDEPENDENT_AMBULATORY_CARE_PROVIDER_SITE_OTHER): Payer: Self-pay | Admitting: Nurse Practitioner

## 2023-01-28 VITALS — BP 95/61 | HR 75 | Resp 17 | Ht 76.0 in | Wt 236.6 lb

## 2023-01-28 DIAGNOSIS — I878 Other specified disorders of veins: Secondary | ICD-10-CM | POA: Diagnosis not present

## 2023-01-28 NOTE — Progress Notes (Signed)
History of Present Illness  There is no documented history at this time  Assessments & Plan   There are no diagnoses linked to this encounter.    Additional instructions  Subjective:  Patient presents with venous ulcer of the Bilateral lower extremity.    Procedure:  3 layer unna wrap was placed Bilateral lower extremity.   Plan:   Follow up in one week.  

## 2023-02-03 NOTE — Progress Notes (Signed)
Cardiology Office Note  Date:  02/05/2023   ID:  Seth Hey Sr., DOB 12-Jan-1941, MRN [id number]  PCP:  Sherlene Shams, MD   Chief Complaint  Patient presents with   Follow-up    6 month F/U-No new cardiac concerns    HPI:  Seth Perez. is a 82 y.o. male with a hx of  coronary artery calcification,  persistent A-fib status post DCCV on 02/22/2022 on Eliquis,  History of cardioversion x 2, June 2023, October 2023 HFpEF,  recurrent PE status post IVC filter and on anticoagulation,  COPD,  chronic lower extremity swelling,  LBBB  Echo June 2023 EF 45 to 50% Who presents for follow-up of his atrial fibrillation, chronic lower extremity swelling  last seen by myself in clinic October 2023 Seen by EP December 2023 Recommendation to stay on amiodarone 200 daily with Eliquis  In follow-up today he reports leg swelling was getting worse Had Unna wraps placed yesterday Taking Lasix here and there not on a consistent manner  Has received lymphedema pump, uses them 4 x so far but not on a regular basis  Denies any tachypalpitations concerning for arrhythmia  EKG personally reviewed by myself on todays visit Normal sinus rhythm left bundle branch block rate 53 bpm  Past medical history reviewed A-fib in the ED on 10/24/2021.   on anticoagulation at that time given history of recurrent PE.   Seen by Dr. Lalla Brothers in 01/2022,  successful DCCV with a 200 J shock on 02/22/2022.    in follow-up 03/04/22  in NSR.    Echo showed LVEF 45-50%, global HK, mildly dilated RA, mild Aortic root dilation.   05/06/22  in rate controlled afib and sent to EP.   Myoview lexiscan was ordered to evaluate for ischemia for reduced EF.  Myoview Lexiscan showed no ischemia or scar, no significant coronary artery calcification, mild aortic atherosclerosis, overall low risk.      PMH:   has a past medical history of Arthritis, Chicken pox, COPD (chronic obstructive pulmonary disease) (HCC),  Pulmonary emboli (HCC), and Type 2 diabetes mellitus with vascular disease (HCC) (06/12/2022).  PSH:    Past Surgical History:  Procedure Laterality Date   CARDIOVERSION N/A 02/22/2022   Procedure: CARDIOVERSION;  Surgeon: Iran Ouch, MD;  Location: [facility] ORS;  Service: Cardiovascular;  Laterality: N/A;   CARDIOVERSION N/A 06/28/2022   Procedure: CARDIOVERSION;  Surgeon: Antonieta Iba, MD;  Location: [facility];  Service: Cardiovascular;  Laterality: N/A;   CHOLECYSTECTOMY  2008   TONSILLECTOMY AND ADENOIDECTOMY  1947    Current Outpatient Medications  Medication Sig Dispense Refill   albuterol (VENTOLIN HFA) 108 (90 Base) MCG/ACT inhaler Inhale 2 puffs into the lungs every 6 (six) hours as needed for wheezing. 6.7 g 11   amiodarone (PACERONE) 200 MG tablet Take 1 tablet (200 mg total) by mouth daily. 90 tablet 3   apixaban (ELIQUIS) 5 MG TABS tablet Take 1 tablet (5 mg total) by mouth 2 (two) times daily. 60 tablet 0   Cobalamin Combinations (B-12) 1000-400 MCG SUBL Place 1,000 mcg under the tongue daily. 90 tablet 3   FARXIGA 10 MG TABS tablet TAKE 1 TABLET BY MOUTH DAILY BEFORE BREAKFAST. 30 tablet 5   furosemide (LASIX) 20 MG tablet Take 20 mg by mouth daily as needed. 30 tablet 3   metoprolol succinate (TOPROL-XL) 25 MG 24 hr tablet Take 1 tablet (25 mg total) by mouth in the morning and at bedtime. 180  tablet 3   Potassium 99 MG TABS Take 1 tablet by mouth daily.     simvastatin (ZOCOR) 20 MG tablet TAKE 1 TABLET BY MOUTH EVERYDAY AT BEDTIME 90 tablet 1   telmisartan (MICARDIS) 20 MG tablet TAKE 1 TABLET BY MOUTH EVERY DAY 30 tablet 2   triamcinolone (KENALOG) 0.025 % ointment Apply 1 application  topically as needed.     VITAMIN D, CHOLECALCIFEROL, PO Take 5,000 Units by mouth daily.     No current facility-administered medications for this visit.    Allergies:   Adhesive [tape]   Social History:  The patient  reports that he has been smoking cigarettes. He has a 59.00  pack-year smoking history. He has never used smokeless tobacco. He reports current alcohol use. He reports that he does not use drugs.   Family History:   family history includes Arthritis in his mother; Asthma in his father; Breast cancer in his maternal grandmother; Emphysema in his father; Heart disease in his maternal grandfather and paternal grandfather; Lung cancer in his brother.    Review of Systems: Review of Systems  Constitutional: Negative.   HENT: Negative.    Respiratory: Negative.    Cardiovascular: Negative.   Gastrointestinal: Negative.   Musculoskeletal: Negative.   Neurological: Negative.   Psychiatric/Behavioral: Negative.    All other systems reviewed and are negative.  PHYSICAL EXAM: VS:  BP 116/70 (BP Location: Left Arm, Patient Position: Sitting, Cuff Size: Normal)   Pulse (!) 53   Ht 6\' 4"  (1.93 m)   Wt 231 lb (104.8 kg)   SpO2 94%   BMI 28.12 kg/m  , BMI Body mass index is 28.12 kg/m. Constitutional:  oriented to person, place, and time. No distress.  HENT:  Head: Grossly normal Eyes:  no discharge. No scleral icterus.  Neck: No JVD, no carotid bruits  Cardiovascular: Regular rate and rhythm, no murmurs appreciated Pulmonary/Chest: Clear to auscultation bilaterally, no wheezes or rails Abdominal: Soft.  no distension.  no tenderness.  Musculoskeletal: Normal range of motion Neurological:  normal muscle tone. Coordination normal. No atrophy Skin: Skin warm and dry Psychiatric: normal affect, pleasant  Recent Labs: 08/28/2022: TSH 1.926 12/11/2022: ALT 16; BUN 20; Creatinine, Ser 1.07; Hemoglobin 15.9; Platelets 168.0; Potassium 4.4; Sodium 141    Lipid Panel Lab Results  Component Value Date   CHOL 141 12/11/2022   HDL 47.10 12/11/2022   LDLCALC 75 12/11/2022   TRIG 98.0 12/11/2022      Wt Readings from Last 3 Encounters:  02/05/23 231 lb (104.8 kg)  01/28/23 236 lb 9.6 oz (107.3 kg)  01/21/23 240 lb (108.9 kg)     ASSESSMENT AND  PLAN:  Problem List Items Addressed This Visit       Cardiology Problems   Atrial fibrillation (HCC) - Primary   Hyperlipidemia with low HDL     Other   COPD (chronic obstructive pulmonary disease) with emphysema (HCC)   Other Visit Diagnoses     Chronic systolic heart failure (HCC)       Type 2 diabetes mellitus with vascular disease (HCC)       Cardiomyopathy, unspecified type (HCC)         Persistent atrial fibrillation History of 2 cardioversions, Holding normal sinus rhythm on amiodarone, metoprolol, Eliquis Has not been taking Lasix  Chronic lower extremity swelling Renal wraps placed recently by vascular Recommend he continue to take his Lasix Normal BMP  History of DVT, PE On Eliquis 5 twice daily, filter in  place  Hyperlipidemia Simvastatin 20 mg daily  Essential hypertension Blood pressure is well controlled on today's visit. No changes made to the medications.  Hypoxia  saturations mid 90s Has copd   Total encounter time more than 30 minutes  Greater than 50% was spent in counseling and coordination of care with the patient    Signed, Dossie Arbour, M.D., Ph.D. [facility] [place], [place] [phone]

## 2023-02-04 ENCOUNTER — Ambulatory Visit (INDEPENDENT_AMBULATORY_CARE_PROVIDER_SITE_OTHER): Payer: Medicare Other | Admitting: Nurse Practitioner

## 2023-02-04 ENCOUNTER — Encounter (INDEPENDENT_AMBULATORY_CARE_PROVIDER_SITE_OTHER): Payer: Self-pay

## 2023-02-04 VITALS — BP 130/78 | HR 59

## 2023-02-04 DIAGNOSIS — I890 Lymphedema, not elsewhere classified: Secondary | ICD-10-CM

## 2023-02-04 NOTE — Progress Notes (Unsigned)
History of Present Illness  There is no documented history at this time  Assessments & Plan   There are no diagnoses linked to this encounter.    Additional instructions  Subjective:  Patient presents with venous ulcer of the Bilateral lower extremity.    Procedure:  3 layer unna wrap was placed Bilateral lower extremity.   Plan:   Follow up in one week.  

## 2023-02-05 ENCOUNTER — Encounter: Payer: Self-pay | Admitting: Cardiovascular Disease

## 2023-02-05 ENCOUNTER — Ambulatory Visit: Payer: Medicare Other | Attending: Cardiovascular Disease | Admitting: Cardiovascular Disease

## 2023-02-05 ENCOUNTER — Encounter (INDEPENDENT_AMBULATORY_CARE_PROVIDER_SITE_OTHER): Payer: Self-pay | Admitting: Nurse Practitioner

## 2023-02-05 VITALS — BP 116/70 | HR 53 | Ht 76.0 in | Wt 231.0 lb

## 2023-02-05 DIAGNOSIS — I5022 Chronic systolic (congestive) heart failure: Secondary | ICD-10-CM | POA: Diagnosis not present

## 2023-02-05 DIAGNOSIS — I4819 Other persistent atrial fibrillation: Secondary | ICD-10-CM | POA: Insufficient documentation

## 2023-02-05 DIAGNOSIS — J432 Centrilobular emphysema: Secondary | ICD-10-CM | POA: Diagnosis not present

## 2023-02-05 DIAGNOSIS — E785 Hyperlipidemia, unspecified: Secondary | ICD-10-CM | POA: Insufficient documentation

## 2023-02-05 DIAGNOSIS — I429 Cardiomyopathy, unspecified: Secondary | ICD-10-CM | POA: Diagnosis not present

## 2023-02-05 DIAGNOSIS — E1159 Type 2 diabetes mellitus with other circulatory complications: Secondary | ICD-10-CM | POA: Insufficient documentation

## 2023-02-05 DIAGNOSIS — E786 Lipoprotein deficiency: Secondary | ICD-10-CM | POA: Diagnosis not present

## 2023-02-05 DIAGNOSIS — Z7984 Long term (current) use of oral hypoglycemic drugs: Secondary | ICD-10-CM | POA: Diagnosis not present

## 2023-02-05 NOTE — Patient Instructions (Signed)
Medication Instructions:  No changes  If you need a refill on your cardiac medications before your next appointment, please call your pharmacy.   Lab work: No new labs needed  Testing/Procedures: No new testing needed  Follow-Up: At [facility], you and your health needs are our priority.  As part of our continuing mission to provide you with exceptional heart care, we have created designated Provider Care Teams.  These Care Teams include your primary Cardiologist (physician) and Advanced Practice Providers (APPs -  Physician Assistants and Nurse Practitioners) who all work together to provide you with the care you need, when you need it.  You will need a follow up appointment in 12 months  Providers on your designated Care Team:   Christopher Berge, NP Ryan Dunn, PA-C Cadence Furth, PA-C  COVID-19 Vaccine Information can be found at: [email or web address] For questions related to vaccine distribution or appointments, please email [email or web address] or call [phone].   

## 2023-02-11 ENCOUNTER — Ambulatory Visit (INDEPENDENT_AMBULATORY_CARE_PROVIDER_SITE_OTHER): Payer: Medicare Other | Admitting: Nurse Practitioner

## 2023-02-11 ENCOUNTER — Encounter (INDEPENDENT_AMBULATORY_CARE_PROVIDER_SITE_OTHER): Payer: Self-pay

## 2023-02-11 VITALS — BP 109/67 | HR 63 | Resp 18

## 2023-02-11 DIAGNOSIS — I890 Lymphedema, not elsewhere classified: Secondary | ICD-10-CM

## 2023-02-11 NOTE — Progress Notes (Signed)
History of Present Illness  There is no documented history at this time  Assessments & Plan   There are no diagnoses linked to this encounter.    Additional instructions  Subjective:  Patient presents with venous ulcer of the Bilateral lower extremity.    Procedure:  3 layer unna wrap was placed Bilateral lower extremity.   Plan:   Follow up in one week.  

## 2023-02-13 ENCOUNTER — Other Ambulatory Visit: Payer: Self-pay | Admitting: Internal Medicine

## 2023-02-18 ENCOUNTER — Ambulatory Visit (INDEPENDENT_AMBULATORY_CARE_PROVIDER_SITE_OTHER): Payer: Medicare Other | Admitting: Nurse Practitioner

## 2023-02-18 ENCOUNTER — Encounter (INDEPENDENT_AMBULATORY_CARE_PROVIDER_SITE_OTHER): Payer: Self-pay

## 2023-02-18 VITALS — BP 104/66 | HR 70 | Resp 18 | Ht 76.0 in | Wt 235.6 lb

## 2023-02-18 DIAGNOSIS — I890 Lymphedema, not elsewhere classified: Secondary | ICD-10-CM | POA: Diagnosis not present

## 2023-02-18 NOTE — Progress Notes (Signed)
History of Present Illness  There is no documented history at this time  Assessments & Plan   There are no diagnoses linked to this encounter.    Additional instructions  Subjective:  Patient presents with venous ulcer of the Bilateral lower extremity.    Procedure:  3 layer unna wrap was placed Bilateral lower extremity.   Plan:   Follow up in one week.  

## 2023-02-20 ENCOUNTER — Other Ambulatory Visit: Payer: Self-pay | Admitting: Internal Medicine

## 2023-02-25 ENCOUNTER — Encounter (INDEPENDENT_AMBULATORY_CARE_PROVIDER_SITE_OTHER): Payer: Self-pay

## 2023-02-25 ENCOUNTER — Ambulatory Visit (INDEPENDENT_AMBULATORY_CARE_PROVIDER_SITE_OTHER): Payer: Medicare Other | Admitting: Vascular Surgery

## 2023-02-25 VITALS — BP 111/72 | HR 68 | Resp 18

## 2023-02-25 DIAGNOSIS — I87009 Postthrombotic syndrome without complications of unspecified extremity: Secondary | ICD-10-CM | POA: Diagnosis not present

## 2023-02-25 DIAGNOSIS — I890 Lymphedema, not elsewhere classified: Secondary | ICD-10-CM | POA: Diagnosis not present

## 2023-02-25 NOTE — Progress Notes (Signed)
History of Present Illness  There is no documented history at this time  Assessments & Plan   There are no diagnoses linked to this encounter.    Additional instructions  Subjective:  Patient presents with venous ulcer of the Bilateral lower extremity.    Procedure:  3 layer unna wrap was placed Bilateral lower extremity.   Plan:   Follow up in one week.  

## 2023-03-04 ENCOUNTER — Encounter (INDEPENDENT_AMBULATORY_CARE_PROVIDER_SITE_OTHER): Payer: Self-pay | Admitting: Nurse Practitioner

## 2023-03-04 ENCOUNTER — Ambulatory Visit (INDEPENDENT_AMBULATORY_CARE_PROVIDER_SITE_OTHER): Payer: Medicare Other | Admitting: Nurse Practitioner

## 2023-03-04 VITALS — BP 101/61 | HR 68 | Resp 16 | Wt 230.0 lb

## 2023-03-04 DIAGNOSIS — I890 Lymphedema, not elsewhere classified: Secondary | ICD-10-CM

## 2023-03-04 NOTE — Progress Notes (Signed)
History of Present Illness  There is no documented history at this time  Assessments & Plan   There are no diagnoses linked to this encounter.    Additional instructions  Subjective:  Patient presents with venous ulcer of the Bilateral lower extremity.    Procedure:  3 layer unna wrap was placed Bilateral lower extremity.   Plan:   Follow up in one week.  

## 2023-03-11 ENCOUNTER — Encounter (INDEPENDENT_AMBULATORY_CARE_PROVIDER_SITE_OTHER): Payer: Self-pay | Admitting: Vascular Surgery

## 2023-03-11 ENCOUNTER — Ambulatory Visit (INDEPENDENT_AMBULATORY_CARE_PROVIDER_SITE_OTHER): Payer: Medicare Other | Admitting: Vascular Surgery

## 2023-03-11 VITALS — BP 104/69 | HR 74 | Resp 18 | Ht 76.0 in | Wt 232.4 lb

## 2023-03-11 DIAGNOSIS — I2699 Other pulmonary embolism without acute cor pulmonale: Secondary | ICD-10-CM | POA: Diagnosis not present

## 2023-03-11 DIAGNOSIS — I890 Lymphedema, not elsewhere classified: Secondary | ICD-10-CM | POA: Diagnosis not present

## 2023-03-11 DIAGNOSIS — E786 Lipoprotein deficiency: Secondary | ICD-10-CM | POA: Diagnosis not present

## 2023-03-11 DIAGNOSIS — J432 Centrilobular emphysema: Secondary | ICD-10-CM | POA: Diagnosis not present

## 2023-03-11 DIAGNOSIS — E785 Hyperlipidemia, unspecified: Secondary | ICD-10-CM

## 2023-03-11 DIAGNOSIS — I87009 Postthrombotic syndrome without complications of unspecified extremity: Secondary | ICD-10-CM

## 2023-03-11 NOTE — Progress Notes (Signed)
MRN : [id number]  Seth PASSARIELLO Sr. is a 82 y.o. (1940-10-04) male who presents with chief complaint of  Chief Complaint  Patient presents with   Follow-up    f/u unnaboots -  .  History of Present Illness: Patient returns today in follow up of his leg swelling and ulceration.  He has been in [employer] now for many weeks.  His skin is largely intact with only some dry scaling skin and hyperpigmentation more on the right than the left.  His swelling is under better control but still not resolved.  Current Outpatient Medications  Medication Sig Dispense Refill   albuterol (VENTOLIN HFA) 108 (90 Base) MCG/ACT inhaler Inhale 2 puffs into the lungs every 6 (six) hours as needed for wheezing. 6.7 g 11   amiodarone (PACERONE) 200 MG tablet Take 1 tablet (200 mg total) by mouth daily. 90 tablet 3   apixaban (ELIQUIS) 5 MG TABS tablet Take 1 tablet (5 mg total) by mouth 2 (two) times daily. 60 tablet 0   Cobalamin Combinations (B-12) 1000-400 MCG SUBL Place 1,000 mcg under the tongue daily. 90 tablet 3   FARXIGA 10 MG TABS tablet TAKE 1 TABLET BY MOUTH DAILY BEFORE BREAKFAST. 30 tablet 5   furosemide (LASIX) 20 MG tablet Take 20 mg by mouth daily as needed. 30 tablet 3   metoprolol succinate (TOPROL-XL) 25 MG 24 hr tablet Take 1 tablet (25 mg total) by mouth in the morning and at bedtime. 180 tablet 3   Potassium 99 MG TABS Take 1 tablet by mouth daily.     simvastatin (ZOCOR) 20 MG tablet TAKE 1 TABLET BY MOUTH EVERYDAY AT BEDTIME 90 tablet 1   telmisartan (MICARDIS) 20 MG tablet TAKE 1 TABLET BY MOUTH EVERY DAY 90 tablet 1   triamcinolone (KENALOG) 0.025 % ointment Apply 1 application  topically as needed.     VITAMIN D, CHOLECALCIFEROL, PO Take 5,000 Units by mouth daily.     No current facility-administered medications for this visit.    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    Past Surgical History:  Procedure Laterality Date   CARDIOVERSION N/A 02/22/2022   Procedure: CARDIOVERSION;  Surgeon: Iran Ouch, MD;  Location: [facility];  Service: Cardiovascular;  Laterality: N/A;   CARDIOVERSION N/A 06/28/2022   Procedure: CARDIOVERSION;  Surgeon: Antonieta Iba, MD;  Location: [facility];  Service: Cardiovascular;  Laterality: N/A;   CHOLECYSTECTOMY  2008   TONSILLECTOMY AND ADENOIDECTOMY  1947     Social History   Tobacco Use   Smoking status: Some Days    Packs/day: 1.00    Years: 59.00    Additional pack years: 0.00    Total pack years: 59.00    Types: Cigarettes   Smokeless tobacco: Never  Vaping Use   Vaping Use: Never used  Substance Use Topics   Alcohol use: Yes    Comment: occassional   Drug use: No      Family History  Problem Relation Age of Onset   Arthritis Mother    Lung cancer Brother        was a smoker   Breast cancer Maternal Grandmother    Heart disease Maternal Grandfather    Asthma Father  Emphysema Father    Heart disease Paternal Grandfather      Allergies  Allergen Reactions   Adhesive [Tape] Rash    REVIEW OF SYSTEMS (Negative unless checked) Constitutional: [] Weight loss  [] Fever  [] Chills Cardiac: [] Chest pain   []  Atrial Fibrillation  [] Palpitations   [] Shortness of breath when laying flat   [] Shortness of breath with exertion. [] Shortness of breath at rest Vascular:  [] Pain in legs with walking   [] Pain in legs with standing [] Pain in legs when laying flat   [] Claudication    [] Pain in feet when laying flat    [x] History of DVT   [x] Phlebitis   [x] Swelling in legs   [] Varicose veins   [] Non-healing ulcers Pulmonary:   [] Uses home oxygen   [] Productive cough   [] Hemoptysis   [] Wheeze  [x] COPD   [] Asthma Neurologic:  [] Dizziness   [] Seizures  [] Blackouts [] History of stroke   [] History of TIA  [] Aphasia   [] Temporary  Blindness   [] Weakness or numbness in arm   [] Weakness or numbness in leg Musculoskeletal:   [] Joint swelling   [] Joint pain   [] Low back pain  []  History of Knee Replacement [x] Arthritis [] back Surgeries  []  Spinal Stenosis    Hematologic:  [] Easy bruising  [] Easy bleeding   [] Hypercoagulable state   [] Anemic Gastrointestinal:  [] Diarrhea   [] Vomiting  [] Gastroesophageal reflux/heartburn   [] Difficulty swallowing. [] Abdominal pain Genitourinary:  [] Chronic kidney disease   [] Difficult urination  [] Anuric   [] Blood in urine [] Frequent urination  [] Burning with urination   [] Hematuria Skin:  [] Rashes   [] Ulcers [] Wounds Psychological:  [] History of anxiety   []  History of major depression  []  Memory Difficulties  Physical Examination  BP 104/69 (BP Location: Left Arm)   Pulse 74   Resp 18   Ht 6\' 4"  (1.93 m)   Wt 232 lb 6.4 oz (105.4 kg)   BMI 28.29 kg/m  Gen:  WD/WN, NAD Head: [place]/AT, No temporalis wasting. Ear/Nose/Throat: Hearing grossly intact, nares w/o erythema or drainage Eyes: Conjunctiva clear. Sclera non-icteric Neck: Supple.  Trachea midline Pulmonary:  Good air movement, no use of accessory muscles.  Cardiac: RRR, no JVD Vascular:  Vessel Right Left  Radial Palpable Palpable               Musculoskeletal: M/S 5/5 throughout.  No deformity or atrophy.  Previous wounds have largely healed.  Hyperpigmentation is present.  1+ bilateral lower extremity edema. Neurologic: Sensation grossly intact in extremities.  Symmetrical.  Speech is fluent.  Psychiatric: Judgment intact, Mood & affect appropriate for pt's clinical situation. Dermatologic: No rashes or ulcers noted.  No cellulitis or open wounds.      Labs No results found for this or any previous visit (from the past 2160 hour(s)).   Radiology No results found.  Assessment/Plan Hyperlipidemia with low HDL lipid control important in reducing the progression of atherosclerotic disease. Continue statin therapy      COPD (chronic obstructive pulmonary disease) with emphysema (HCC) Continue pulmonary medications and aerosols as already ordered, these medications have been reviewed and there are no changes at this time.   Recurrent pulmonary embolism (HCC) Remains on anticoagulation with filter in place.   Type 2 diabetes mellitus with vascular disease (HCC) blood glucose control important in reducing the progression of atherosclerotic disease. Also, involved in wound healing. On appropriate medications.  Lymphedema Unna boots have significantly improved his clinical situation.  He is borderline for removing the Unna boots and going to compression socks,  but he has had early failures every time we have tried that this year.  Particularly in the heat of the summer and the increased swelling from fluid retention, we will going to keep him in Unna boots for several more weeks.  A 3 layer Unna boot was placed bilaterally and will be changed weekly.    Festus Barren, MD  03/12/2023 11:15 AM    This note was created with Dragon medical transcription system.  Any errors from dictation are purely unintentional

## 2023-03-12 ENCOUNTER — Ambulatory Visit: Payer: Medicare Other | Admitting: Cardiology

## 2023-03-12 NOTE — Assessment & Plan Note (Signed)
Unna boots have significantly improved his clinical situation.  He is borderline for removing the Unna boots and going to compression socks, but he has had early failures every time we have tried that this year.  Particularly in the heat of the summer and the increased swelling from fluid retention, we will going to keep him in Unna boots for several more weeks.  A 3 layer Unna boot was placed bilaterally and will be changed weekly.

## 2023-03-17 ENCOUNTER — Ambulatory Visit: Payer: Medicare Other | Attending: Cardiology | Admitting: Cardiology

## 2023-03-17 ENCOUNTER — Encounter: Payer: Self-pay | Admitting: Cardiology

## 2023-03-17 ENCOUNTER — Other Ambulatory Visit
Admission: RE | Admit: 2023-03-17 | Discharge: 2023-03-17 | Disposition: A | Discharge status: Home / Self Care | Payer: Medicare Other | Source: Ambulatory Visit | Attending: Cardiology | Admitting: Cardiology

## 2023-03-17 VITALS — BP 114/70 | HR 80 | Ht 76.0 in | Wt 230.0 lb

## 2023-03-17 DIAGNOSIS — I4819 Other persistent atrial fibrillation: Secondary | ICD-10-CM | POA: Insufficient documentation

## 2023-03-17 DIAGNOSIS — Z79899 Other long term (current) drug therapy: Secondary | ICD-10-CM | POA: Insufficient documentation

## 2023-03-17 DIAGNOSIS — I480 Paroxysmal atrial fibrillation: Secondary | ICD-10-CM | POA: Insufficient documentation

## 2023-03-17 DIAGNOSIS — D6869 Other thrombophilia: Secondary | ICD-10-CM | POA: Insufficient documentation

## 2023-03-17 LAB — T4, FREE: Free T4: 1.16 ng/dL — ABNORMAL HIGH (ref 0.61–1.12)

## 2023-03-17 LAB — TSH: TSH: 1.598 u[IU]/mL (ref 0.350–4.500)

## 2023-03-17 NOTE — Progress Notes (Signed)
Cardiology Office Note Date:  03/17/2023  Patient ID:  Seth VOTH Sr., DOB 27-Apr-1941, MRN [id number] PCP:  Sherlene Shams, MD  Cardiologist:  None Electrophysiologist: Lanier Prude, MD    Chief Complaint: 6 mon EP follow-up, AF, amiodarone  History of Present Illness: Seth Hey Sr. is a 82 y.o. male with PMH notable for persis AFib, CHF, h/o DVT, h/o PE s/p IVC filter; seen today for Lanier Prude, MD for routine electrophysiology followup.  Last saw Dr. Lalla Brothers 08/2022, was maintaining sinus rhythm on amiodarone.  Was unaware of previous A-fib, but was feeling better now that he was maintaining sinus.  Today, he states he has no cardiac complaints. Denies palpitations, chest pain, pressure.  Diligently takes eliquis BID, no bleeding concerns.  He has bilateral leg wraps in place, managed by vasc sx (Dr. Wyn Quaker).  He is past due for pulm follow-up with PFTs. He thought today's appt was to address that. Has been told he has COPD, but thinks his breathing difficulties are d/t PEs he had many years ago.    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    Past Surgical History:  Procedure Laterality Date   CARDIOVERSION N/A 02/22/2022   Procedure: CARDIOVERSION;  Surgeon: Iran Ouch, MD;  Location: [facility];  Service: Cardiovascular;  Laterality: N/A;   CARDIOVERSION N/A 06/28/2022   Procedure: CARDIOVERSION;  Surgeon: Antonieta Iba, MD;  Location: [facility];  Service: Cardiovascular;  Laterality: N/A;   CHOLECYSTECTOMY  2008   TONSILLECTOMY AND ADENOIDECTOMY  1947    Current Outpatient Medications  Medication Instructions   albuterol (VENTOLIN HFA) 108 (90 Base) MCG/ACT inhaler 2 puffs, Inhalation, Every 6 hours PRN   amiodarone (PACERONE)  200 mg, Oral, Daily   apixaban (ELIQUIS) 5 mg, Oral, 2 times daily   Cobalamin Combinations (B-12) 1000-400 MCG SUBL 1,000 mcg, Sublingual, Daily   Farxiga 10 mg, Oral, Daily before breakfast   furosemide (LASIX) 20 mg, Oral, Daily PRN   metoprolol succinate (TOPROL-XL) 25 mg, Oral, 2 times daily   Potassium 99 MG TABS 1 tablet, Oral, Daily   simvastatin (ZOCOR) 20 MG tablet TAKE 1 TABLET BY MOUTH EVERYDAY AT BEDTIME   telmisartan (MICARDIS) 20 mg, Oral, Daily   triamcinolone (KENALOG) 0.025 % ointment 1 application , Topical, As needed   VITAMIN D, CHOLECALCIFEROL, PO 5,000 Units, Oral, Daily    Social History:  The patient  reports that he has been smoking cigarettes. He has a 59.00 pack-year smoking history. He has never used smokeless tobacco. He reports current alcohol use. He reports that he does not use drugs.   Family History:   The patient's family history includes Arthritis in his mother; Asthma in his father; Breast cancer in his maternal grandmother; Emphysema in his father; Heart disease in his maternal grandfather and paternal grandfather; Lung cancer in his brother.  ROS:  Please see the history of present illness. All other systems are reviewed and otherwise negative.   PHYSICAL EXAM:  VS:  BP 114/70 (BP Location: Left Arm, Patient Position: Sitting, Cuff Size: Large)   Pulse 80   Ht 6\' 4"  (1.93 m)   Wt 230 lb (104.3 kg)   SpO2 94%   BMI 28.00  kg/m  BMI: Body mass index is 28 kg/m.  GEN- The patient is well appearing, alert and oriented x 3 today.   Lungs- Clear to ausculation bilaterally, normal work of breathing.  Heart- Regular rate and rhythm, no murmurs, rubs or gallops Extremities- 1-2+ peripheral edema, warm, dry   EKG is not ordered. Personal review of EKG from  02/05/2023  shows:  SB, rate 53bpm; 1st deg AV block, LBBB        Recent Labs: 08/28/2022: TSH 1.926 12/11/2022: ALT 16; BUN 20; Creatinine, Ser 1.07; Hemoglobin 15.9; Platelets 168.0; Potassium  4.4; Sodium 141  12/11/2022: Cholesterol 141; Direct LDL 75.0; HDL 47.10; LDL Cholesterol 75; Total CHOL/HDL Ratio 3; Triglycerides 98.0; VLDL 19.6   CrCl cannot be calculated (Patient's most recent lab result is older than the maximum 21 days allowed.).   Wt Readings from Last 3 Encounters:  03/17/23 230 lb (104.3 kg)  03/11/23 232 lb 6.4 oz (105.4 kg)  03/04/23 230 lb (104.3 kg)     Additional studies reviewed include: Previous EP, cardiology notes.   NM myocardial spect, 05/21/2022   Normal pharmacologic myocardial perfusion stress test without evidence of significant ischemia or scar.   Left ventricular systolic function is normal by visual estimation and Siemens calculation (LVEF 58%).  LVEF of 44% by QGS calculation is most likely artifactually low due to gating parameters.   There is no significant coronary artery calcification.  Mild aortic atherosclerosis is noted on the attenuation correction CT.   This is a low risk study.  TTE, 03/04/2022  1. Left ventricular ejection fraction, by estimation, is 45 to 50%. The left ventricle has mildly decreased function. The left ventricle demonstrates global hypokinesis. Left ventricular diastolic parameters are indeterminate.   2. Right ventricular systolic function is normal. The right ventricular size is mildly enlarged.   3. Right atrial size was mildly dilated.   4. The mitral valve is normal in structure. No evidence of mitral valve regurgitation.   5. The aortic valve is tricuspid. Aortic valve regurgitation is not visualized.   6. Aortic dilatation noted. There is mild dilatation of the aortic root, measuring 41 mm.     ASSESSMENT AND PLAN:  #) persis AFib Maintaining sinus rhythm on 200mg  amio daily Labs recently updated with PCP Update TSH Update PFTs as per pulm  #) Hypercoag d/t AFib CHA2DS2-VASc Score = 5 [CHF History: 1, HTN History: 1, Diabetes History: 1, Stroke History: 0, Vascular Disease History: 0, Age Score: 2,  Gender Score: 0].  Therefore, the patient's annual risk of stroke is 7.2 %. NOAC - 5mg  eliquis BID, appropriately dosed No bleeding concerns    Current medicines are reviewed at length with the patient today.   The patient does not have concerns regarding his medicines.  The following changes were made today:  none  Labs/ tests ordered today include:  Orders Placed This Encounter  Procedures   TSH   T4, free     Disposition: Follow up with Dr. Lalla Brothers or EP APP in 6 months   Signed, Sherie Don, NP  03/17/23  3:13 PM  Electrophysiology [facility]

## 2023-03-17 NOTE — Patient Instructions (Addendum)
Medication Instructions:  Your physician recommends that you continue on your current medications as directed. Please refer to the Current Medication list given to you today.  *If you need a refill on your cardiac medications before your next appointment, please call your pharmacy*   Lab Work: TSH and Free T4 - Please go to the [facility]. You will check in at the front desk to the right as you walk into the atrium. Valet Parking is offered if needed. - No appointment needed. You may go any day between 7 am and 6 pm.  If you have labs (blood work) drawn today and your tests are completely normal, you will receive your results only by: MyChart Message (if you have MyChart) OR A paper copy in the mail If you have any lab test that is abnormal or we need to change your treatment, we will call you to review the results.   Testing/Procedures: No testing ordered   Follow-Up: At [facility], you and your health needs are our priority.  As part of our continuing mission to provide you with exceptional heart care, we have created designated Provider Care Teams.  These Care Teams include your primary Cardiologist (physician) and Advanced Practice Providers (APPs -  Physician Assistants and Nurse Practitioners) who all work together to provide you with the care you need, when you need it.  We recommend signing up for the patient portal called "MyChart".  Sign up information is provided on this After Visit Summary.  MyChart is used to connect with patients for Virtual Visits (Telemedicine).  Patients are able to view lab/test results, encounter notes, upcoming appointments, etc.  Non-urgent messages can be sent to your provider as well.   To learn more about what you can do with MyChart, go to [email or web address].    Your next appointment:   6 month(s)  Provider:   Steffanie Dunn, MD or Sherie Don, NP

## 2023-03-18 ENCOUNTER — Ambulatory Visit (INDEPENDENT_AMBULATORY_CARE_PROVIDER_SITE_OTHER): Payer: Medicare Other | Admitting: Nurse Practitioner

## 2023-03-18 ENCOUNTER — Encounter (INDEPENDENT_AMBULATORY_CARE_PROVIDER_SITE_OTHER): Payer: Self-pay | Admitting: Nurse Practitioner

## 2023-03-18 VITALS — BP 98/60 | HR 71 | Resp 16 | Wt 228.4 lb

## 2023-03-18 DIAGNOSIS — I87009 Postthrombotic syndrome without complications of unspecified extremity: Secondary | ICD-10-CM | POA: Diagnosis not present

## 2023-03-18 NOTE — Progress Notes (Signed)
History of Present Illness  There is no documented history at this time  Assessments & Plan   There are no diagnoses linked to this encounter.    Additional instructions  Subjective:  Patient presents with venous ulcer of the Bilateral lower extremity.    Procedure:  3 layer unna wrap was placed Bilateral lower extremity.   Plan:   Follow up in one week.  

## 2023-03-25 ENCOUNTER — Ambulatory Visit (INDEPENDENT_AMBULATORY_CARE_PROVIDER_SITE_OTHER): Payer: Medicare Other | Admitting: Nurse Practitioner

## 2023-03-25 ENCOUNTER — Encounter (INDEPENDENT_AMBULATORY_CARE_PROVIDER_SITE_OTHER): Payer: Self-pay

## 2023-03-25 VITALS — BP 115/75 | HR 85 | Resp 18

## 2023-03-25 DIAGNOSIS — I87009 Postthrombotic syndrome without complications of unspecified extremity: Secondary | ICD-10-CM

## 2023-03-25 NOTE — Progress Notes (Unsigned)
History of Present Illness  There is no documented history at this time  Assessments & Plan   There are no diagnoses linked to this encounter.    Additional instructions  Subjective:  Patient presents with venous ulcer of the Bilateral lower extremity.    Procedure:  3 layer unna wrap was placed Bilateral lower extremity.   Plan:   Follow up in one week.  

## 2023-04-01 ENCOUNTER — Encounter (INDEPENDENT_AMBULATORY_CARE_PROVIDER_SITE_OTHER): Payer: Self-pay | Admitting: Nurse Practitioner

## 2023-04-01 ENCOUNTER — Ambulatory Visit (INDEPENDENT_AMBULATORY_CARE_PROVIDER_SITE_OTHER): Payer: Medicare Other | Admitting: Pulmonary Disease

## 2023-04-01 ENCOUNTER — Encounter: Payer: Self-pay | Admitting: Pulmonary Disease

## 2023-04-01 ENCOUNTER — Ambulatory Visit (INDEPENDENT_AMBULATORY_CARE_PROVIDER_SITE_OTHER): Payer: Medicare Other | Admitting: Nurse Practitioner

## 2023-04-01 VITALS — BP 109/75 | HR 64 | Resp 16

## 2023-04-01 VITALS — BP 130/82 | HR 74 | Temp 98.2°F | Ht 76.0 in | Wt 230.0 lb

## 2023-04-01 DIAGNOSIS — I890 Lymphedema, not elsewhere classified: Secondary | ICD-10-CM | POA: Diagnosis not present

## 2023-04-01 DIAGNOSIS — J449 Chronic obstructive pulmonary disease, unspecified: Secondary | ICD-10-CM | POA: Diagnosis not present

## 2023-04-01 DIAGNOSIS — I87009 Postthrombotic syndrome without complications of unspecified extremity: Secondary | ICD-10-CM

## 2023-04-01 DIAGNOSIS — R918 Other nonspecific abnormal finding of lung field: Secondary | ICD-10-CM

## 2023-04-01 MED ORDER — TRELEGY ELLIPTA 100-62.5-25 MCG/ACT IN AEPB
1.0000 | INHALATION_SPRAY | Freq: Every day | RESPIRATORY_TRACT | 0 refills | Status: DC
Start: 2023-04-01 — End: 2023-06-03

## 2023-04-01 NOTE — Patient Instructions (Signed)
We are giving you a trial of an inhaler called Trelegy Ellipta.  This is 1 puff daily.  Make sure you rinse your mouth well after you use it.  Let us know how you are doing with this inhaler so we can call the prescription into your pharmacy.  This is a maintenance inhaler and you need to use it every day.  You may continue using your rescue inhaler as needed.  You may notice that while you are taking the Trelegy you do not need to use the rescue inhaler as much.  We are checking your oxygen level at nighttime during sleep.  We are rescheduling your breathing tests.  We will see you in follow-up in 6 to 8 weeks time call sooner should any new problems arise.

## 2023-04-01 NOTE — Progress Notes (Unsigned)
Subjective:    Patient ID: Seth Para., male    DOB: 11/01/1940, 82 y.o.   MRN: [id number]  Patient Care Team: Sherlene Shams, MD as PCP - General (Internal Medicine) Lanier Prude, MD as PCP - Electrophysiology (Cardiology)  Chief Complaint  Patient presents with   Follow-up    DOE. A little wheezing. No cough.    Patient profile: Seth Perez first saw the [facility] pulmonary clinic in March of 2014 for evaluation of pulmonary embolism, a pulmonary nodule and COPD. He had moderate obstruction on pulmonary function testing and was prescribed Spiriva. He had a provoked pulmonary embolism in late February 2014 and was treated for this with Xarelto. He also had a 1.2 cm pleural-based nodule in the right upper lobe.  In may of 2015 he had a repeat large pulmonary embolism requiring thrombolytic therapy and an IVC filter placement. He quit smoking in 2014, 1 ppd for about 57 years.  Last saw Dr. Max Fickle 18 June 2017.  Last CT chest was 24 November 2019 classified as lung RADS 2S with the nodule of concern on the left upper lobe measuring 6.7 mm.  No nodules on right.  HPI The patient is an 82 year old former smoker with a very complex history as noted. He is referred for reassessment of a lung nodule [facility]  The patient was initially seen by me on 16 March 2023 and at that time a CT chest was ordered, pulmonary function testing was ordered and he was instructed to follow-up in 3 months. The patient has "aged out" of the low-dose lung cancer screening program.  Patient had CT chest without contrast on 12 November 2022 showed persistence of the scattered small solid pulmonary nodules all stable since March or 2021 at this point considered benign.  No imaging follow-up is required.  I reviewed these films independently.  There was also some asymmetric gynecomastia noted and the patient was referred to discuss  this with his primary care physician.  Patient did not get his PFTs performed as requested.  These will need to be reordered.  The patient presents today with no active complaints.  He is ambulatory with assistance of a cane. He is a very terse historian. He has issues with persistent atrial fibrillation, chronic systolic heart failure (LVEF 45%) and prior history of DVT and submassive PE x 2. The patient is currently on anticoagulation and has also an IVC filter in place. He does not endorse dyspnea. Does not endorse cough or sputum production. No hemoptysis. He is not on any inhalers currently set for as needed albuterol and he rarely uses this. He does not endorse any fevers, chills or sweats. No orthopnea or paroxysmal nocturnal dyspnea. No chest pain. Has regular follow-ups with cardiology.   He is erratic with use of inhalers currently using albuterol 2-3 times per week.  Using Advair 2-3 times per week and Incruse every other day.  He cannot explain why he is using this schedule.  He stated also that he has a old O2 compressor at home since 2012 but he does not use it.   Review of Systems A 10 point review of systems was performed and it is as noted above otherwise negative.   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   Personal history of tobacco use, presenting hazards to health 05/21/2016   Orthostasis 02/29/2016   Tobacco use disorder, mild, in sustained remission, abuse 02/16/2016   Lower extremity venous stasis 06/19/2014   Benign localized  hyperplasia of prostate with urinary obstruction and other lower urinary tract symptoms (LUTS)(600.21) 01/06/2013   Elevated prostate specific antigen (PSA) 01/06/2013   Incomplete emptying of bladder 01/06/2013   Microscopic hematuria 01/06/2013   History of pulmonary embolism 11/24/2012   Multiple pulmonary nodules determined by computed tomography of lung 11/24/2012   Hospital discharge follow-up 11/24/2012   COPD (chronic obstructive pulmonary disease) with emphysema (HCC) 11/03/2012   Colon cancer screening 11/03/2012   Prostate cancer screening 11/03/2012   Routine general medical examination at a health care facility 11/03/2012    Social History   Tobacco Use   Smoking status: Every Day    Packs/day: 1.00    Years: 59.00    Additional pack years: 0.00    Total pack years: 59.00    Types: Cigarettes   Smokeless tobacco: Never   Tobacco comments:    1-2 cigarettes a day- khj 04/01/2023  Substance Use Topics   Alcohol use: Yes    Comment: occassional    Allergies  Allergen Reactions   Adhesive [Tape] Rash    Current Meds  Medication Sig   albuterol (VENTOLIN HFA) 108 (90 Base) MCG/ACT inhaler Inhale 2 puffs into the lungs every 6 (six) hours as needed for wheezing.   amiodarone (PACERONE) 200 MG tablet Take 1 tablet (200 mg total) by mouth daily.   apixaban (ELIQUIS) 5 MG TABS tablet Take 1 tablet (5 mg total) by mouth 2 (two) times daily.   Cobalamin Combinations (B-12) 1000-400 MCG SUBL Place 1,000 mcg under the tongue daily.   FARXIGA 10 MG TABS tablet TAKE 1 TABLET BY MOUTH DAILY BEFORE BREAKFAST.   Fluticasone-Umeclidin-Vilant (TRELEGY ELLIPTA) 100-62.5-25 MCG/ACT AEPB Inhale 1 puff into the lungs daily.   furosemide (LASIX) 20 MG tablet Take 20 mg by mouth daily as needed.   metoprolol succinate (TOPROL-XL) 25 MG 24 hr tablet Take 1 tablet (25 mg total) by mouth in the morning and at bedtime.   Potassium 99 MG TABS Take 1 tablet by mouth daily.   simvastatin  (ZOCOR) 20 MG tablet TAKE 1 TABLET BY MOUTH EVERYDAY AT BEDTIME   telmisartan (MICARDIS) 20 MG tablet TAKE 1 TABLET BY MOUTH EVERY DAY   triamcinolone (KENALOG) 0.025 % ointment Apply 1 application  topically as needed.   VITAMIN D, CHOLECALCIFEROL, PO Take 5,000 Units by mouth daily.    Immunization History  Administered Date(s) Administered   Fluad Quad(high Dose 65+) 08/13/2019, 06/12/2022   Influenza Split 06/17/2014   Influenza, High Dose Seasonal PF 08/02/2016, 06/18/2017, 08/05/2018   Influenza,inj,Quad PF,6+ Mos 08/03/2015   Moderna Sars-Covid-2 Vaccination 12/22/2019, 01/19/2020   Pneumococcal Conjugate-13 06/17/2014   Pneumococcal Polysaccharide-23 06/18/2007, 09/29/2017   Pneumococcal-Unspecified 09/28/2006   Tdap 06/23/2014      Objective:     BP 130/82 (BP Location: Left Arm, Cuff Size: Normal)   Pulse 74   Temp 98.2 F (36.8 C)   Ht 6\' 4"  (1.93 m)   Wt 230 lb (104.3 kg)   SpO2  94%   BMI 28.00 kg/m   SpO2: 94 % O2 Device: None (Room air)  GENERAL: Overweight gentleman, ambulatory with assistance of a cane.  No conversational dyspnea.  No acute distress. HEAD: Normocephalic, atraumatic.  EYES: Pupils equal, round, reactive to light.  No scleral icterus.  MOUTH: Horrible dentition, missing teeth, NECK: Supple. No thyromegaly. Trachea midline. No JVD.  No adenopathy. PULMONARY: Good air entry bilaterally.  There are scattered wheezes throughout. CARDIOVASCULAR: S1 and S2.  Regular rate and rhythm.  No murmurs ABDOMEN: Protuberant, otherwise benign. MUSCULOSKELETAL: No joint deformity, no clubbing, significant lymphedema of the lower extremities, wrapped.   NEUROLOGIC: Gait not tested.  Awake, alert, oriented, no overt focal deficit. SKIN: Intact,warm,dry. PSYCH: Normal mood and behavior.   Assessment & Plan:     ICD-10-CM   1. Moderate COPD (chronic obstructive pulmonary disease) (HCC)  J44.9 Overnight Pulse Oximetry Study   Trial of Trelegy Ellipta  100 Continue as needed albuterol Discontinue Advair/Incruse    2. Multiple pulmonary nodules determined by computed tomography of lung  R91.8    These have been stable since 2021 No need for further imaging Considered benign    3. 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    Dispense:  28 each    Refill:  0   Will see the patient in follow-up in 6 to 8 weeks time he is to contact us prior to that time should any new problems arise.   Gailen Shelter, MD Advanced Bronchoscopy PCCM [facility]    *This note was dictated using voice recognition software/Dragon.  Despite best efforts to proofread, errors can occur which can change the meaning. Any transcriptional errors that result from this process are unintentional and may not be fully corrected at the time of dictation.

## 2023-04-01 NOTE — Progress Notes (Signed)
History of Present Illness  There is no documented history at this time  Assessments & Plan   There are no diagnoses linked to this encounter.    Additional instructions  Subjective:  Patient presents with venous ulcer of the Bilateral lower extremity.    Procedure:  3 layer unna wrap was placed Bilateral lower extremity.   Plan:   Follow up in one week.  

## 2023-04-08 ENCOUNTER — Encounter (INDEPENDENT_AMBULATORY_CARE_PROVIDER_SITE_OTHER): Payer: Self-pay | Admitting: Nurse Practitioner

## 2023-04-08 ENCOUNTER — Ambulatory Visit (INDEPENDENT_AMBULATORY_CARE_PROVIDER_SITE_OTHER): Payer: Medicare Other | Admitting: Nurse Practitioner

## 2023-04-08 VITALS — BP 104/67 | HR 50 | Resp 16 | Wt 231.0 lb

## 2023-04-08 DIAGNOSIS — I890 Lymphedema, not elsewhere classified: Secondary | ICD-10-CM

## 2023-04-08 NOTE — Progress Notes (Signed)
 History of Present Illness  There is no documented history at this time  Assessments & Plan   There are no diagnoses linked to this encounter.    Additional instructions  Subjective:  Patient presents with venous ulcer of the Bilateral lower extremity.    Procedure:  3 layer unna wrap was placed Bilateral lower extremity.   Plan:   Follow up in one week.  

## 2023-04-15 ENCOUNTER — Encounter (INDEPENDENT_AMBULATORY_CARE_PROVIDER_SITE_OTHER): Payer: Self-pay | Admitting: Vascular Surgery

## 2023-04-15 ENCOUNTER — Ambulatory Visit (INDEPENDENT_AMBULATORY_CARE_PROVIDER_SITE_OTHER): Payer: Medicare Other | Admitting: Vascular Surgery

## 2023-04-15 VITALS — BP 97/63 | HR 49 | Resp 18 | Ht 76.0 in | Wt 231.0 lb

## 2023-04-15 DIAGNOSIS — I890 Lymphedema, not elsewhere classified: Secondary | ICD-10-CM | POA: Diagnosis not present

## 2023-04-15 NOTE — Progress Notes (Signed)
History of Present Illness  There is no documented history at this time  Assessments & Plan   There are no diagnoses linked to this encounter.    Additional instructions  Subjective:  Patient presents with venous ulcer of the Bilateral lower extremity.    Procedure:  3 layer unna wrap was placed Bilateral lower extremity.   Plan:   Follow up in one week.  

## 2023-04-20 DIAGNOSIS — S51812A Laceration without foreign body of left forearm, initial encounter: Secondary | ICD-10-CM | POA: Diagnosis not present

## 2023-04-20 DIAGNOSIS — R0902 Hypoxemia: Secondary | ICD-10-CM | POA: Diagnosis not present

## 2023-04-20 DIAGNOSIS — S50812A Abrasion of left forearm, initial encounter: Secondary | ICD-10-CM | POA: Diagnosis not present

## 2023-04-20 DIAGNOSIS — I499 Cardiac arrhythmia, unspecified: Secondary | ICD-10-CM | POA: Diagnosis not present

## 2023-04-20 DIAGNOSIS — S0990XA Unspecified injury of head, initial encounter: Secondary | ICD-10-CM | POA: Diagnosis not present

## 2023-04-20 DIAGNOSIS — Z7902 Long term (current) use of antithrombotics/antiplatelets: Secondary | ICD-10-CM | POA: Diagnosis not present

## 2023-04-20 DIAGNOSIS — L509 Urticaria, unspecified: Secondary | ICD-10-CM | POA: Diagnosis not present

## 2023-04-20 DIAGNOSIS — T63461A Toxic effect of venom of wasps, accidental (unintentional), initial encounter: Secondary | ICD-10-CM | POA: Diagnosis not present

## 2023-04-26 ENCOUNTER — Other Ambulatory Visit: Payer: Self-pay | Admitting: Cardiology

## 2023-05-11 ENCOUNTER — Other Ambulatory Visit: Payer: Self-pay | Admitting: Medical

## 2023-05-19 DIAGNOSIS — J449 Chronic obstructive pulmonary disease, unspecified: Secondary | ICD-10-CM | POA: Diagnosis not present

## 2023-05-27 ENCOUNTER — Ambulatory Visit: Payer: Medicare Other | Attending: Pulmonary Disease

## 2023-05-27 ENCOUNTER — Telehealth: Payer: Self-pay

## 2023-05-27 DIAGNOSIS — G4734 Idiopathic sleep related nonobstructive alveolar hypoventilation: Secondary | ICD-10-CM

## 2023-05-27 DIAGNOSIS — J449 Chronic obstructive pulmonary disease, unspecified: Secondary | ICD-10-CM | POA: Diagnosis not present

## 2023-05-27 LAB — PULMONARY FUNCTION TEST ARMC ONLY
DL/VA % pred: 96 %
DL/VA: 3.61 ml/min/mmHg/L
DLCO unc % pred: 77 %
DLCO unc: 22.12 ml/min/mmHg
FEF 25-75 Pre: 1.91 L/s
FEF2575-%Pred-Pre: 79 %
FEV1-%Pred-Pre: 60 %
FEV1-Pre: 2.15 L
FEV1FVC-%Pred-Pre: 108 %
FEV6-%Pred-Pre: 59 %
FEV6-Pre: 2.79 L
FEV6FVC-%Pred-Pre: 106 %
FVC-%Pred-Pre: 56 %
FVC-Pre: 2.79 L
Pre FEV1/FVC ratio: 77 %
Pre FEV6/FVC Ratio: 100 %

## 2023-05-27 NOTE — Telephone Encounter (Signed)
ONO reviewed by Dr. Jayme Cloud- Low SpO2 76.0%. Patient qualifies for nocturnal O2 at 2L. 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  I have notified the patient and his partner Research scientist (physical sciences)). I have placed an order for O2 at night.  Nothing further needed.

## 2023-06-03 ENCOUNTER — Ambulatory Visit (INDEPENDENT_AMBULATORY_CARE_PROVIDER_SITE_OTHER): Payer: Medicare Other | Admitting: Pulmonary Disease

## 2023-06-03 ENCOUNTER — Encounter: Payer: Self-pay | Admitting: Pulmonary Disease

## 2023-06-03 VITALS — BP 120/70 | HR 73 | Temp 98.1°F | Ht 76.0 in | Wt 236.2 lb

## 2023-06-03 DIAGNOSIS — R918 Other nonspecific abnormal finding of lung field: Secondary | ICD-10-CM | POA: Diagnosis not present

## 2023-06-03 DIAGNOSIS — G4734 Idiopathic sleep related nonobstructive alveolar hypoventilation: Secondary | ICD-10-CM

## 2023-06-03 DIAGNOSIS — J449 Chronic obstructive pulmonary disease, unspecified: Secondary | ICD-10-CM | POA: Diagnosis not present

## 2023-06-03 DIAGNOSIS — I890 Lymphedema, not elsewhere classified: Secondary | ICD-10-CM | POA: Diagnosis not present

## 2023-06-03 DIAGNOSIS — H9193 Unspecified hearing loss, bilateral: Secondary | ICD-10-CM | POA: Diagnosis not present

## 2023-06-03 MED ORDER — TRELEGY ELLIPTA 100-62.5-25 MCG/ACT IN AEPB: 1.0000 | INHALATION_SPRAY | Freq: Every day | RESPIRATORY_TRACT | Status: DC

## 2023-06-03 NOTE — Patient Instructions (Addendum)
After you finish the Incruse start the Trelegy again.  DO NOT USE ADVAIR OR INCRUSE AFTER YOU START THE TRELEGY.  Please have someone call the oxygen company, [employer], so that the oxygen can be set up in your home.  You need oxygen at nighttime.  Please follow-up with ENT with regards to your hearing.  We will see you in follow-up in 3 months time call sooner should any new problems arise.

## 2023-06-03 NOTE — Progress Notes (Signed)
Subjective:    Patient ID: Seth Para., male    DOB: May 23, 1941, 82 y.o.   MRN: [id number]  Patient Care Team: Sherlene Shams, MD as PCP - General (Internal Medicine) Lanier Prude, MD as PCP - Electrophysiology (Cardiology)  Chief Complaint  Patient presents with   Follow-up    NO SOB or cough. Some wheezing.    Patient profile: Seth Perez first saw the [facility] pulmonary clinic in March of 2014 for evaluation of pulmonary embolism, a pulmonary nodule and COPD. He had moderate obstruction on pulmonary function testing and was prescribed Spiriva. He had a provoked pulmonary embolism in late February 2014 and was treated for this with Xarelto. He also had a 1.2 cm pleural-based nodule in the right upper lobe.  In may of 2015 he had a repeat large pulmonary embolism requiring thrombolytic therapy and an IVC filter placement. He quit smoking in 2014, 1 ppd for about 57 years.  Last saw Dr. Max Fickle 18 June 2017.  Last CT chest was 24 November 2019 classified as lung RADS 2S with the nodule of concern on the left upper lobe measuring 6.7 mm.  No nodules on right.  HPI The patient is an 82 year old former smoker with a very complex history as noted. He is referred for reassessment of a lung nodule [facility]  The patient was initially seen by me on 16 March 2023 and at that time a CT chest was ordered, pulmonary function testing was ordered and he was instructed to follow-up in 3 months.  The patient was last seen by me on 01 April 2023, at that time and over night oximetry was ordered and the patient was given a trial of Trelegy Ellipta.  Trelegy was used to try to consolidate the patient's Advair and Incruse.  The patient has "aged out" of the low-dose lung cancer screening program.  Patient had CT chest without contrast on 12 November 2022 showed persistence of the scattered small solid pulmonary nodules all  stable since March or 2021 at this point considered benign.  No imaging follow-up is required.  I reviewed these films independently.  There was also some asymmetric gynecomastia noted and the patient was referred to discuss this with his primary care physician.  Patient did not get his PFTs performed as requested.  These will need to be reordered.   The patient presents today with no active complaints.  He is ambulatory with assistance of a cane. He is a very terse historian. He has issues with persistent atrial fibrillation, chronic systolic heart failure (LVEF 45%) and prior history of DVT and submassive PE x 2. The patient is currently on anticoagulation and has also an IVC filter in place. He does not endorse dyspnea. Does not endorse cough or sputum production. No hemoptysis.  He does notice wheezing throughout the day.  He has not started using the Trelegy because he did not want to "waste" the inhalers that he had on hand.  He is using only Incruse and as needed albuterol.  He does not endorse any fevers, chills or sweats. No orthopnea or paroxysmal nocturnal dyspnea. No chest pain. Has regular follow-ups with cardiology.    He qualified for nocturnal oxygen and this was ordered for him however he has not had it delivered due to his not answering calls from the DME company.  He is extremely hard of hearing and evaluation today is exceedingly challenging due  to this.  Due to his hearing impediment he was only able to have spirometry performed on 27 May 2023, he was not able to cooperate with lung volumes.  There is suggestion of moderate restriction by spirometry however this would have to be confirmed with lung volumes which the patient was not able to perform.  There was some minimal diffusion defect.  Review of Systems A 10 point review of systems was performed and it is as noted above otherwise negative.   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   Personal history of tobacco use, presenting hazards to health 05/21/2016   Orthostasis 02/29/2016   Tobacco use disorder, mild, in sustained remission, abuse 02/16/2016   Lower extremity venous stasis 06/19/2014   Benign localized hyperplasia of prostate with urinary obstruction and other lower urinary tract symptoms (LUTS)(600.21) 01/06/2013   Elevated prostate specific antigen (PSA) 01/06/2013   Incomplete emptying of bladder 01/06/2013   Microscopic hematuria 01/06/2013   History of pulmonary embolism 11/24/2012   Multiple pulmonary nodules determined by computed tomography of lung 11/24/2012   Hospital discharge follow-up 11/24/2012   COPD (chronic obstructive pulmonary disease) with emphysema (HCC) 11/03/2012   Colon cancer screening 11/03/2012   Prostate cancer screening 11/03/2012   Routine general medical examination at a health care facility 11/03/2012    Social History   Tobacco Use   Smoking status: Every Day    Current packs/day: 1.00    Average packs/day: 1 pack/day for 59.0 years (59.0 ttl pk-yrs)    Types: Cigarettes   Smokeless tobacco: Never   Tobacco comments:    1-2 cigarettes a day- 83/06/2023  Substance Use Topics   Alcohol use: Yes    Comment: occassional    Allergies  Allergen Reactions   Adhesive [Tape] Rash    Current Meds  Medication Sig   albuterol (VENTOLIN HFA) 108 (90 Base) MCG/ACT inhaler Inhale 2 puffs into the lungs every 6 (six) hours as needed for wheezing.   amiodarone (PACERONE) 200 MG tablet Take 1 tablet (200 mg total) by mouth  daily.   apixaban (ELIQUIS) 5 MG TABS tablet Take 1 tablet (5 mg total) by mouth 2 (two) times daily.   Cobalamin Combinations (B-12) 1000-400 MCG SUBL Place 1,000 mcg under the tongue daily.   dapagliflozin propanediol (FARXIGA) 10 MG TABS tablet TAKE 1 TABLET BY MOUTH EVERY DAY BEFORE BREAKFAST   Fluticasone-Umeclidin-Vilant (TRELEGY ELLIPTA) 100-62.5-25 MCG/ACT AEPB Inhale 1 puff into the lungs daily.   furosemide (LASIX) 20 MG tablet Take 20 mg by mouth daily as needed.   metoprolol succinate (TOPROL-XL) 25 MG 24 hr tablet TAKE 1 TABLET (25 MG) BY MOUTH IN THE MORNING AND AT BEDTIME   Potassium 99 MG TABS Take 1 tablet by mouth daily.   simvastatin (ZOCOR) 20 MG tablet TAKE 1 TABLET BY MOUTH EVERYDAY AT BEDTIME   telmisartan (MICARDIS) 20 MG tablet TAKE 1 TABLET BY MOUTH EVERY DAY   triamcinolone (KENALOG) 0.025 % ointment Apply 1 application  topically as needed.   VITAMIN D, CHOLECALCIFEROL, PO Take 5,000  Units by mouth daily.    Immunization History  Administered Date(s) Administered   Fluad Quad(high Dose 65+) 08/13/2019, 06/12/2022   Influenza Split 06/17/2014   Influenza, High Dose Seasonal PF 08/02/2016, 06/18/2017, 08/05/2018   Influenza,inj,Quad PF,6+ Mos 08/03/2015   Moderna Sars-Covid-2 Vaccination 12/22/2019, 01/19/2020   Pneumococcal Conjugate-13 06/17/2014   Pneumococcal Polysaccharide-23 06/18/2007, 09/29/2017   Pneumococcal-Unspecified 09/28/2006   Tdap 06/23/2014        Objective:     BP 120/70 (BP Location: Right Arm, Cuff Size: Large)   Pulse 73   Temp 98.1 F (36.7 C)   Ht 6\' 4"  (1.93 m)   Wt 236 lb 3.2 oz (107.1 kg)   SpO2 95%   BMI 28.75 kg/m   SpO2: 95 % O2 Device: None (Room air)  GENERAL: Overweight gentleman, ambulatory with assistance of a cane.  No conversational dyspnea.  No acute distress.EXTREMELY HOH. HEAD: Normocephalic, atraumatic.  EYES: Pupils equal, round, reactive to light.  No scleral icterus.  MOUTH: Horrible dentition,  missing teeth, NECK: Supple. No thyromegaly. Trachea midline. No JVD.  No adenopathy. PULMONARY: Good air entry bilaterally.  No adventitious sounds. CARDIOVASCULAR: S1 and S2.  Regular rate and rhythm.  No murmurs ABDOMEN: Protuberant, otherwise benign. MUSCULOSKELETAL: No joint deformity, no clubbing, significant lymphedema of the lower extremities, wrapped.   NEUROLOGIC: Gait not tested.  Awake, alert, oriented, no overt focal deficit. EXTREMELY HOH. SKIN: Intact,warm,dry. PSYCH: Normal mood and behavior.        Assessment & Plan:     ICD-10-CM   1. Moderate COPD (chronic obstructive pulmonary disease) (HCC)  J44.9    Start Trelegy after he completes Incruse Continue as needed albuterol    2. Multiple pulmonary nodules determined by computed tomography of lung  R91.8    Stable since 2021 Considered benign No further imaging necessary    3. Nocturnal hypoxemia  G47.34    Oxygen has been ordered Advised patient to call [employer] to set up    4. Lymphedema  I89.0    Chronic issue Postphlebitic syndrome Prior IVC filter placement Continue Unna boot wrapping per wound care    5. Severe hearing loss of both ears  H91.93    Advised patient to follow-up with ENT He has upcoming appointment This issue adds complexity to his management     Meds ordered this encounter  Medications   Fluticasone-Umeclidin-Vilant (TRELEGY ELLIPTA) 100-62.5-25 MCG/ACT AEPB    Sig: Inhale 1 puff into the lungs daily.    Order Specific Question:   Lot Number?    Answer:   [id number]    Order Specific Question:   Expiration Date?    Answer:   06/23/2024    Order Specific Question:   Quantity    Answer:   2    We will see the patient in follow-up in 3 months time call sooner should any new problems arise.  Gailen Shelter, MD Advanced Bronchoscopy PCCM [facility]    *This note was dictated using voice recognition software/Dragon.  Despite best efforts to proofread, errors can  occur which can change the meaning. Any transcriptional errors that result from this process are unintentional and may not be fully corrected at the time of dictation.

## 2023-06-07 ENCOUNTER — Encounter: Payer: Self-pay | Admitting: Pulmonary Disease

## 2023-06-23 ENCOUNTER — Encounter: Payer: Self-pay | Admitting: Pulmonary Disease

## 2023-07-03 ENCOUNTER — Other Ambulatory Visit: Payer: Self-pay | Admitting: Internal Medicine

## 2023-07-03 ENCOUNTER — Other Ambulatory Visit: Payer: Self-pay | Admitting: Cardiology

## 2023-07-09 DIAGNOSIS — H903 Sensorineural hearing loss, bilateral: Secondary | ICD-10-CM | POA: Diagnosis not present

## 2023-07-14 DIAGNOSIS — H903 Sensorineural hearing loss, bilateral: Secondary | ICD-10-CM | POA: Diagnosis not present

## 2023-09-02 ENCOUNTER — Encounter: Payer: Self-pay | Admitting: Pulmonary Disease

## 2023-09-02 ENCOUNTER — Ambulatory Visit (INDEPENDENT_AMBULATORY_CARE_PROVIDER_SITE_OTHER): Payer: Medicare Other | Admitting: Pulmonary Disease

## 2023-09-02 VITALS — BP 128/70 | HR 88 | Temp 98.0°F | Ht 76.0 in | Wt 236.0 lb

## 2023-09-02 DIAGNOSIS — J449 Chronic obstructive pulmonary disease, unspecified: Secondary | ICD-10-CM

## 2023-09-02 DIAGNOSIS — G4734 Idiopathic sleep related nonobstructive alveolar hypoventilation: Secondary | ICD-10-CM | POA: Diagnosis not present

## 2023-09-02 DIAGNOSIS — H9193 Unspecified hearing loss, bilateral: Secondary | ICD-10-CM

## 2023-09-02 DIAGNOSIS — R918 Other nonspecific abnormal finding of lung field: Secondary | ICD-10-CM | POA: Diagnosis not present

## 2023-09-02 DIAGNOSIS — I890 Lymphedema, not elsewhere classified: Secondary | ICD-10-CM

## 2023-09-02 NOTE — Patient Instructions (Signed)
VISIT SUMMARY:  During today's visit, we discussed your COPD management, hearing impairment, and general health maintenance. Your breathing has been stable, but there have been issues with oxygen therapy due to communication problems. Your hearing aids are now functioning well after adjustments. We also reviewed your vaccination schedule.  YOUR PLAN:  -CHRONIC OBSTRUCTIVE PULMONARY DISEASE (COPD): COPD is a chronic lung condition that makes it hard to breathe. You are currently using Advair and Incruse inhalers. We discussed switching to Trelegy, which combines the medications in Advair and Incruse, to simplify your treatment. Continue using Advair twice daily and Incruse once daily in the evening until you finish them, then switch to Trelegy once daily. Monitor your response to Trelegy and report back.  -HEARING IMPAIRMENT: You have severe hearing impairment, but your new hearing aids are now functioning properly after adjustments by the ENT and audiologist. Continue to follow up with them as needed.  -GENERAL HEALTH MAINTENANCE: You are due for a flu shot and a COVID-19 booster. We also discussed the importance of getting an RSV vaccination due to your high risk. You should get the RSV vaccination at a later date to avoid multiple vaccinations at once. Please get your flu shot and COVID-19 booster now and schedule the RSV vaccination later.  INSTRUCTIONS:  Please schedule a follow-up appointment in 3-4 months. Contact us if you continue to have issues with your oxygen therapy.

## 2023-09-02 NOTE — Progress Notes (Signed)
Subjective:    Patient ID: Seth Para., male    DOB: April 06, 1941, 82 y.o.   MRN: [id number]  Patient Care Team: Sherlene Shams, MD as PCP - General (Internal Medicine) Lanier Prude, MD as PCP - Electrophysiology (Cardiology)  Chief Complaint  Patient presents with   Follow-up    No SOB. Occasional wheezing and cough.    BACKGROUND/INTERVAL: Mr. Gaw first saw the [facility] in March of 2014 for evaluation of pulmonary embolism, a pulmonary nodule and COPD. He had moderate obstruction on pulmonary function testing and was prescribed Spiriva. He had a provoked pulmonary embolism in late February 2014 and was treated for this with Xarelto. He also had a 1.2 cm pleural-based nodule in the right upper lobe.  In may of 2015 he had a repeat large pulmonary embolism requiring thrombolytic therapy and an IVC filter placement. He quit smoking in 2014, 1 ppd for about 57 years.  Last saw Dr. Max Fickle 18 June 2017.  Last CT chest was 24 November 2019 classified as lung RADS 2S with the nodule of concern on the left upper lobe measuring 6.7 mm.  No nodules on right. I first evaluated the patient on 15 October 2022, most recent visit on 03 June 2023.  Most recent CT chest without contrast 12 November 2022 was consistent with benign etiology of the multiple lung nodules.  No image follow-up is required.  Did PFT performed 27 May 2023 suggestive of restriction however patient was unable to perform lung volume determinations.  Minimal diffusion defect noted.  Patient is very hard of hearing and had difficulty following directions of the test affecting the values.  HPI Discussed the use of AI scribe software for clinical note transcription with the patient, who gave verbal consent to proceed.  History of Present Illness   The patient, a former smoker with a very complex medical history including COPD, multiple benign lung nodules, and recurrent pulmonary embolism,  presents for follow-up. He recently acquired new hearing aids after a two-month period of significant hearing impairment. Despite initial difficulties with the hearing aids, they are now functioning well.  The patient's breathing has been stable, but he has not yet started using oxygen due to difficulties communicating with the oxygen supply company. He expresses frustration with the company's request for credit card information without providing clear charges or contact information.  Regarding his COPD management, the patient is currently on Advair and has two full Incruse inhalers left. He was previously prescribed Trelegy samples, which combines the active ingredients of Advair and Incruse, but has not yet started using them.  He does not endorse any respiratory symptoms today.    Review of Systems A 10 point review of systems was performed and it is as noted above otherwise negative.   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   Personal history of tobacco use, presenting hazards to health 05/21/2016   Orthostasis 02/29/2016   Tobacco use disorder, mild, in sustained remission, abuse 02/16/2016   Lower extremity venous stasis 06/19/2014   Benign  localized hyperplasia of prostate with urinary obstruction and other lower urinary tract symptoms (LUTS)(600.21) 01/06/2013   Elevated prostate specific antigen (PSA) 01/06/2013   Incomplete emptying of bladder 01/06/2013   Microscopic hematuria 01/06/2013   History of pulmonary embolism 11/24/2012   Multiple  pulmonary nodules determined by computed tomography of lung 11/24/2012   Hospital discharge follow-up 11/24/2012   COPD (chronic obstructive pulmonary disease) with emphysema (HCC) 11/03/2012   Colon cancer screening 11/03/2012   Prostate cancer screening 11/03/2012   Routine general medical examination at a health care facility 11/03/2012    Social History   Tobacco Use   Smoking status: Every Day    Current packs/day: 1.00    Average packs/day: 1 pack/day for 59.0 years (59.0 ttl pk-yrs)    Types: Cigarettes   Smokeless tobacco: Never   Tobacco comments:    3 cigarettes a day- khj 09/02/2023  Substance Use Topics   Alcohol use: Yes    Comment: occassional    Allergies  Allergen Reactions   Adhesive [Tape] Rash    Current Meds  Medication Sig   albuterol (VENTOLIN HFA) 108 (90 Base) MCG/ACT inhaler Inhale 2 puffs into the lungs every 6 (six) hours as needed for wheezing.   amiodarone (PACERONE) 200 MG tablet TAKE 1 TABLET BY MOUTH EVERY DAY   apixaban (ELIQUIS) 5 MG TABS tablet Take 1 tablet (5 mg total) by mouth 2 (two) times daily.   Cobalamin Combinations (B-12) 1000-400 MCG SUBL Place 1,000 mcg under the tongue daily.   dapagliflozin propanediol (FARXIGA) 10 MG TABS tablet TAKE 1 TABLET BY MOUTH EVERY DAY BEFORE BREAKFAST   furosemide (LASIX) 20 MG tablet Take 20 mg by mouth daily as needed.   metoprolol succinate (TOPROL-XL) 25 MG 24 hr tablet TAKE 1 TABLET (25 MG) BY MOUTH IN THE MORNING AND AT BEDTIME   Potassium 99 MG TABS Take 1 tablet by mouth daily.   simvastatin (ZOCOR) 20 MG tablet TAKE 1 TABLET BY MOUTH EVERYDAY AT BEDTIME   telmisartan (MICARDIS) 20 MG tablet TAKE 1 TABLET BY MOUTH EVERY DAY   triamcinolone (KENALOG) 0.025 % ointment Apply 1 application  topically as needed.   VITAMIN D, CHOLECALCIFEROL, PO Take 5,000 Units by mouth daily.    Immunization History  Administered Date(s) Administered   Fluad Quad(high Dose 65+) 08/13/2019, 06/12/2022    Influenza Split 06/17/2014   Influenza, High Dose Seasonal PF 08/02/2016, 06/18/2017, 08/05/2018   Influenza,inj,Quad PF,6+ Mos 08/03/2015   Moderna Covid-19 Fall Seasonal Vaccine 20yrs & older 09/03/2023   Moderna Sars-Covid-2 Vaccination 12/22/2019, 01/19/2020   Pneumococcal Conjugate-13 06/17/2014   Pneumococcal Polysaccharide-23 06/18/2007, 09/29/2017   Pneumococcal-Unspecified 09/28/2006   Tdap 06/23/2014        Objective:     BP 128/70 (BP Location: Right Arm, Cuff Size: Large)   Pulse 88   Temp 98 F (36.7 C)   Ht 6\' 4"  (1.93 m)   Wt 236 lb (107 kg)   SpO2 95%   BMI 28.73 kg/m   SpO2: 95 % O2 Device: None (Room air)  GENERAL: Overweight gentleman, ambulatory with assistance of a walker.  No conversational dyspnea.  No acute distress.EXTREMELY HOH though able to hear today with hearing aids. HEAD: Normocephalic, atraumatic.  Wears hearing aids bilaterally EYES: Pupils equal, round, reactive to light.  No scleral icterus.  MOUTH: Horrible dentition, missing teeth, NECK: Supple. No thyromegaly. Trachea midline. No JVD.  No adenopathy. PULMONARY: Good air entry bilaterally.  No adventitious sounds. CARDIOVASCULAR: S1 and S2.  Regular rate and rhythm.  No murmurs ABDOMEN: Protuberant, otherwise benign. MUSCULOSKELETAL: No joint deformity, no clubbing, significant lymphedema of the lower extremities, wrapped.   NEUROLOGIC: Gait not tested.  Awake, alert, oriented, no overt focal deficit. EXTREMELY HOH. SKIN: Intact,warm,dry.  Significant chronic stasis dermatitis bilaterally. PSYCH: Normal mood and behavior.     Assessment & Plan:     ICD-10-CM   1. Moderate COPD (chronic obstructive pulmonary disease) (HCC)  J44.9     2. Multiple pulmonary nodules determined by computed tomography of lung  R91.8    BENIGN, no further imaging necessary    3. Nocturnal hypoxemia  G47.34    has not received oxygen yet    4. Lymphedema  I89.0     5. Severe hearing loss of both  ears  H91.93      Discussion:    Chronic Obstructive Pulmonary Disease (COPD) COPD follow-up. Reports good breathing but inconsistent oxygen therapy due to hearing and communication issues. Currently on Advair and Incruse. Discussed switching to Trelegy to simplify regimen. Wheezing noted on examination, indicating need for optimized inhaler use. - Continue Advair twice daily - Add Incruse once daily in the evening - Switch to Trelegy once daily after finishing current inhalers - Monitor response to Trelegy and report back  Hearing Impairment Severe hearing impairment with issues in hearing aids. Adjusted by ENT and audiologist, now functioning properly. - Continue follow-up with ENT and audiologist as needed  General Health Maintenance Due for flu shot and COVID-19 booster. Discussed importance of RSV vaccination due to high risk. Advised to get RSV vaccination later to avoid multiple vaccinations at once. - Administer flu shot and COVID-19 booster - Schedule RSV vaccination at a later date  Follow-up - Schedule follow-up appointment in 3-4 months - Contact doctor if issues with oxygen therapy persist.     Advised if symptoms do not improve or worsen, to please contact office for sooner follow up or seek emergency care.    I spent 45 minutes of dedicated to the care of this patient on the date of this encounter to include pre-visit review of records, face-to-face time with the patient discussing conditions above, post visit ordering of testing, clinical documentation with the electronic health record, making appropriate referrals as documented, and communicating necessary findings to members of the patients care team.     C. Danice Goltz, MD Advanced Bronchoscopy PCCM [facility]    *This note was generated using voice recognition software/Dragon and/or AI transcription program.  Despite best efforts to proofread, errors can occur which can change the  meaning. Any transcriptional errors that result from this process are unintentional and may not be fully corrected at the time of dictation.

## 2023-09-03 DIAGNOSIS — Z23 Encounter for immunization: Secondary | ICD-10-CM | POA: Diagnosis not present

## 2023-09-05 ENCOUNTER — Encounter: Payer: Self-pay | Admitting: Pulmonary Disease

## 2023-10-19 DIAGNOSIS — M6281 Muscle weakness (generalized): Secondary | ICD-10-CM | POA: Diagnosis not present

## 2023-10-19 DIAGNOSIS — I10 Essential (primary) hypertension: Secondary | ICD-10-CM | POA: Diagnosis present

## 2023-10-19 DIAGNOSIS — S41111A Laceration without foreign body of right upper arm, initial encounter: Secondary | ICD-10-CM | POA: Diagnosis present

## 2023-10-19 DIAGNOSIS — R0602 Shortness of breath: Secondary | ICD-10-CM | POA: Diagnosis not present

## 2023-10-19 DIAGNOSIS — E785 Hyperlipidemia, unspecified: Secondary | ICD-10-CM | POA: Diagnosis present

## 2023-10-19 DIAGNOSIS — M549 Dorsalgia, unspecified: Secondary | ICD-10-CM | POA: Diagnosis present

## 2023-10-19 DIAGNOSIS — J449 Chronic obstructive pulmonary disease, unspecified: Secondary | ICD-10-CM | POA: Diagnosis present

## 2023-10-19 DIAGNOSIS — L03119 Cellulitis of unspecified part of limb: Secondary | ICD-10-CM | POA: Diagnosis not present

## 2023-10-19 DIAGNOSIS — R918 Other nonspecific abnormal finding of lung field: Secondary | ICD-10-CM | POA: Diagnosis not present

## 2023-10-19 DIAGNOSIS — J4489 Other specified chronic obstructive pulmonary disease: Secondary | ICD-10-CM | POA: Diagnosis not present

## 2023-10-19 DIAGNOSIS — H9190 Unspecified hearing loss, unspecified ear: Secondary | ICD-10-CM | POA: Diagnosis present

## 2023-10-19 DIAGNOSIS — M542 Cervicalgia: Secondary | ICD-10-CM | POA: Diagnosis present

## 2023-10-19 DIAGNOSIS — J189 Pneumonia, unspecified organism: Secondary | ICD-10-CM | POA: Diagnosis not present

## 2023-10-19 DIAGNOSIS — R5383 Other fatigue: Secondary | ICD-10-CM | POA: Diagnosis not present

## 2023-10-19 DIAGNOSIS — F17200 Nicotine dependence, unspecified, uncomplicated: Secondary | ICD-10-CM | POA: Diagnosis not present

## 2023-10-19 DIAGNOSIS — R58 Hemorrhage, not elsewhere classified: Secondary | ICD-10-CM | POA: Diagnosis not present

## 2023-10-19 DIAGNOSIS — I890 Lymphedema, not elsewhere classified: Secondary | ICD-10-CM | POA: Diagnosis not present

## 2023-10-19 DIAGNOSIS — I739 Peripheral vascular disease, unspecified: Secondary | ICD-10-CM | POA: Diagnosis not present

## 2023-10-19 DIAGNOSIS — R531 Weakness: Secondary | ICD-10-CM | POA: Diagnosis present

## 2023-10-19 DIAGNOSIS — Z79899 Other long term (current) drug therapy: Secondary | ICD-10-CM | POA: Diagnosis not present

## 2023-10-19 DIAGNOSIS — I447 Left bundle-branch block, unspecified: Secondary | ICD-10-CM | POA: Diagnosis not present

## 2023-10-19 DIAGNOSIS — R2689 Other abnormalities of gait and mobility: Secondary | ICD-10-CM | POA: Diagnosis not present

## 2023-10-19 DIAGNOSIS — L03116 Cellulitis of left lower limb: Secondary | ICD-10-CM | POA: Diagnosis present

## 2023-10-19 DIAGNOSIS — I959 Hypotension, unspecified: Secondary | ICD-10-CM | POA: Diagnosis not present

## 2023-10-19 DIAGNOSIS — R000 Tachycardia, unspecified: Secondary | ICD-10-CM | POA: Diagnosis not present

## 2023-10-19 DIAGNOSIS — Z7951 Long term (current) use of inhaled steroids: Secondary | ICD-10-CM | POA: Diagnosis not present

## 2023-10-19 DIAGNOSIS — I4891 Unspecified atrial fibrillation: Secondary | ICD-10-CM | POA: Diagnosis present

## 2023-10-19 DIAGNOSIS — L03115 Cellulitis of right lower limb: Secondary | ICD-10-CM | POA: Diagnosis present

## 2023-10-19 DIAGNOSIS — I4819 Other persistent atrial fibrillation: Secondary | ICD-10-CM | POA: Diagnosis not present

## 2023-10-19 DIAGNOSIS — Z86718 Personal history of other venous thrombosis and embolism: Secondary | ICD-10-CM | POA: Diagnosis not present

## 2023-10-19 DIAGNOSIS — L0390 Cellulitis, unspecified: Secondary | ICD-10-CM | POA: Diagnosis not present

## 2023-10-19 DIAGNOSIS — I440 Atrioventricular block, first degree: Secondary | ICD-10-CM | POA: Diagnosis not present

## 2023-10-20 DIAGNOSIS — I4891 Unspecified atrial fibrillation: Secondary | ICD-10-CM | POA: Diagnosis present

## 2023-10-20 DIAGNOSIS — I959 Hypotension, unspecified: Secondary | ICD-10-CM | POA: Diagnosis not present

## 2023-10-20 DIAGNOSIS — I4819 Other persistent atrial fibrillation: Secondary | ICD-10-CM | POA: Diagnosis not present

## 2023-10-20 DIAGNOSIS — R531 Weakness: Secondary | ICD-10-CM | POA: Diagnosis present

## 2023-10-20 DIAGNOSIS — I739 Peripheral vascular disease, unspecified: Secondary | ICD-10-CM | POA: Diagnosis not present

## 2023-10-20 DIAGNOSIS — J4489 Other specified chronic obstructive pulmonary disease: Secondary | ICD-10-CM | POA: Diagnosis not present

## 2023-10-20 DIAGNOSIS — J449 Chronic obstructive pulmonary disease, unspecified: Secondary | ICD-10-CM | POA: Diagnosis present

## 2023-10-20 DIAGNOSIS — L03115 Cellulitis of right lower limb: Secondary | ICD-10-CM | POA: Diagnosis present

## 2023-10-20 DIAGNOSIS — J189 Pneumonia, unspecified organism: Secondary | ICD-10-CM | POA: Diagnosis not present

## 2023-10-20 DIAGNOSIS — I447 Left bundle-branch block, unspecified: Secondary | ICD-10-CM | POA: Diagnosis not present

## 2023-10-20 DIAGNOSIS — M549 Dorsalgia, unspecified: Secondary | ICD-10-CM | POA: Diagnosis present

## 2023-10-20 DIAGNOSIS — H9190 Unspecified hearing loss, unspecified ear: Secondary | ICD-10-CM | POA: Diagnosis present

## 2023-10-20 DIAGNOSIS — L03116 Cellulitis of left lower limb: Secondary | ICD-10-CM | POA: Diagnosis present

## 2023-10-20 DIAGNOSIS — I10 Essential (primary) hypertension: Secondary | ICD-10-CM | POA: Diagnosis present

## 2023-10-20 DIAGNOSIS — F17200 Nicotine dependence, unspecified, uncomplicated: Secondary | ICD-10-CM | POA: Diagnosis present

## 2023-10-20 DIAGNOSIS — M6281 Muscle weakness (generalized): Secondary | ICD-10-CM | POA: Diagnosis not present

## 2023-10-20 DIAGNOSIS — I890 Lymphedema, not elsewhere classified: Secondary | ICD-10-CM | POA: Diagnosis present

## 2023-10-20 DIAGNOSIS — E785 Hyperlipidemia, unspecified: Secondary | ICD-10-CM | POA: Diagnosis present

## 2023-10-20 DIAGNOSIS — Z86718 Personal history of other venous thrombosis and embolism: Secondary | ICD-10-CM | POA: Diagnosis not present

## 2023-10-20 DIAGNOSIS — Z79899 Other long term (current) drug therapy: Secondary | ICD-10-CM | POA: Diagnosis not present

## 2023-10-20 DIAGNOSIS — Z7951 Long term (current) use of inhaled steroids: Secondary | ICD-10-CM | POA: Diagnosis not present

## 2023-10-20 DIAGNOSIS — I440 Atrioventricular block, first degree: Secondary | ICD-10-CM | POA: Diagnosis not present

## 2023-10-20 DIAGNOSIS — L03119 Cellulitis of unspecified part of limb: Secondary | ICD-10-CM | POA: Diagnosis not present

## 2023-10-20 DIAGNOSIS — L0390 Cellulitis, unspecified: Secondary | ICD-10-CM | POA: Diagnosis not present

## 2023-10-20 DIAGNOSIS — S41111A Laceration without foreign body of right upper arm, initial encounter: Secondary | ICD-10-CM | POA: Diagnosis present

## 2023-10-20 DIAGNOSIS — M542 Cervicalgia: Secondary | ICD-10-CM | POA: Diagnosis present

## 2023-10-20 DIAGNOSIS — R2689 Other abnormalities of gait and mobility: Secondary | ICD-10-CM | POA: Diagnosis not present

## 2023-10-24 DIAGNOSIS — L03115 Cellulitis of right lower limb: Secondary | ICD-10-CM | POA: Diagnosis present

## 2023-10-24 DIAGNOSIS — N179 Acute kidney failure, unspecified: Secondary | ICD-10-CM | POA: Diagnosis present

## 2023-10-24 DIAGNOSIS — G8929 Other chronic pain: Secondary | ICD-10-CM | POA: Diagnosis not present

## 2023-10-24 DIAGNOSIS — L02415 Cutaneous abscess of right lower limb: Secondary | ICD-10-CM | POA: Diagnosis present

## 2023-10-24 DIAGNOSIS — L03116 Cellulitis of left lower limb: Secondary | ICD-10-CM | POA: Diagnosis present

## 2023-10-24 DIAGNOSIS — I482 Chronic atrial fibrillation, unspecified: Secondary | ICD-10-CM | POA: Diagnosis not present

## 2023-10-24 DIAGNOSIS — R0902 Hypoxemia: Secondary | ICD-10-CM | POA: Diagnosis not present

## 2023-10-24 DIAGNOSIS — I824Z3 Acute embolism and thrombosis of unspecified deep veins of distal lower extremity, bilateral: Secondary | ICD-10-CM | POA: Diagnosis present

## 2023-10-24 DIAGNOSIS — R531 Weakness: Secondary | ICD-10-CM | POA: Diagnosis not present

## 2023-10-24 DIAGNOSIS — R6520 Severe sepsis without septic shock: Secondary | ICD-10-CM | POA: Diagnosis not present

## 2023-10-24 DIAGNOSIS — I890 Lymphedema, not elsewhere classified: Secondary | ICD-10-CM | POA: Diagnosis present

## 2023-10-24 DIAGNOSIS — J189 Pneumonia, unspecified organism: Secondary | ICD-10-CM | POA: Diagnosis present

## 2023-10-24 DIAGNOSIS — L0390 Cellulitis, unspecified: Secondary | ICD-10-CM | POA: Diagnosis not present

## 2023-10-24 DIAGNOSIS — I723 Aneurysm of iliac artery: Secondary | ICD-10-CM | POA: Diagnosis not present

## 2023-10-24 DIAGNOSIS — R918 Other nonspecific abnormal finding of lung field: Secondary | ICD-10-CM | POA: Diagnosis not present

## 2023-10-24 DIAGNOSIS — I2699 Other pulmonary embolism without acute cor pulmonale: Secondary | ICD-10-CM | POA: Diagnosis not present

## 2023-10-24 DIAGNOSIS — S80211D Abrasion, right knee, subsequent encounter: Secondary | ICD-10-CM | POA: Diagnosis not present

## 2023-10-24 DIAGNOSIS — I10 Essential (primary) hypertension: Secondary | ICD-10-CM | POA: Diagnosis not present

## 2023-10-24 DIAGNOSIS — I82413 Acute embolism and thrombosis of femoral vein, bilateral: Secondary | ICD-10-CM | POA: Diagnosis not present

## 2023-10-24 DIAGNOSIS — Z66 Do not resuscitate: Secondary | ICD-10-CM | POA: Diagnosis not present

## 2023-10-24 DIAGNOSIS — L97911 Non-pressure chronic ulcer of unspecified part of right lower leg limited to breakdown of skin: Secondary | ICD-10-CM | POA: Diagnosis not present

## 2023-10-24 DIAGNOSIS — R2689 Other abnormalities of gait and mobility: Secondary | ICD-10-CM | POA: Diagnosis not present

## 2023-10-24 DIAGNOSIS — R270 Ataxia, unspecified: Secondary | ICD-10-CM | POA: Diagnosis not present

## 2023-10-24 DIAGNOSIS — Z4682 Encounter for fitting and adjustment of non-vascular catheter: Secondary | ICD-10-CM | POA: Diagnosis not present

## 2023-10-24 DIAGNOSIS — M7989 Other specified soft tissue disorders: Secondary | ICD-10-CM | POA: Diagnosis not present

## 2023-10-24 DIAGNOSIS — I7090 Unspecified atherosclerosis: Secondary | ICD-10-CM | POA: Diagnosis not present

## 2023-10-24 DIAGNOSIS — L97812 Non-pressure chronic ulcer of other part of right lower leg with fat layer exposed: Secondary | ICD-10-CM | POA: Diagnosis not present

## 2023-10-24 DIAGNOSIS — J9811 Atelectasis: Secondary | ICD-10-CM | POA: Diagnosis not present

## 2023-10-24 DIAGNOSIS — I959 Hypotension, unspecified: Secondary | ICD-10-CM | POA: Diagnosis not present

## 2023-10-24 DIAGNOSIS — E872 Acidosis, unspecified: Secondary | ICD-10-CM | POA: Diagnosis present

## 2023-10-24 DIAGNOSIS — S80211A Abrasion, right knee, initial encounter: Secondary | ICD-10-CM | POA: Diagnosis not present

## 2023-10-24 DIAGNOSIS — M6281 Muscle weakness (generalized): Secondary | ICD-10-CM | POA: Diagnosis not present

## 2023-10-24 DIAGNOSIS — I2694 Multiple subsegmental pulmonary emboli without acute cor pulmonale: Secondary | ICD-10-CM | POA: Diagnosis present

## 2023-10-24 DIAGNOSIS — L03119 Cellulitis of unspecified part of limb: Secondary | ICD-10-CM | POA: Diagnosis not present

## 2023-10-24 DIAGNOSIS — I4891 Unspecified atrial fibrillation: Secondary | ICD-10-CM | POA: Diagnosis present

## 2023-10-24 DIAGNOSIS — I480 Paroxysmal atrial fibrillation: Secondary | ICD-10-CM | POA: Diagnosis not present

## 2023-10-24 DIAGNOSIS — L97822 Non-pressure chronic ulcer of other part of left lower leg with fat layer exposed: Secondary | ICD-10-CM | POA: Diagnosis not present

## 2023-10-24 DIAGNOSIS — R6521 Severe sepsis with septic shock: Secondary | ICD-10-CM | POA: Diagnosis present

## 2023-10-24 DIAGNOSIS — I739 Peripheral vascular disease, unspecified: Secondary | ICD-10-CM | POA: Diagnosis not present

## 2023-10-24 DIAGNOSIS — J4489 Other specified chronic obstructive pulmonary disease: Secondary | ICD-10-CM | POA: Diagnosis not present

## 2023-10-24 DIAGNOSIS — R0689 Other abnormalities of breathing: Secondary | ICD-10-CM | POA: Diagnosis not present

## 2023-10-24 DIAGNOSIS — Z7984 Long term (current) use of oral hypoglycemic drugs: Secondary | ICD-10-CM | POA: Diagnosis not present

## 2023-10-24 DIAGNOSIS — Z4659 Encounter for fitting and adjustment of other gastrointestinal appliance and device: Secondary | ICD-10-CM | POA: Diagnosis not present

## 2023-10-24 DIAGNOSIS — A419 Sepsis, unspecified organism: Secondary | ICD-10-CM | POA: Diagnosis present

## 2023-10-24 DIAGNOSIS — L02416 Cutaneous abscess of left lower limb: Secondary | ICD-10-CM | POA: Diagnosis present

## 2023-10-24 DIAGNOSIS — L98492 Non-pressure chronic ulcer of skin of other sites with fat layer exposed: Secondary | ICD-10-CM | POA: Diagnosis not present

## 2023-10-24 DIAGNOSIS — R278 Other lack of coordination: Secondary | ICD-10-CM | POA: Diagnosis not present

## 2023-10-24 DIAGNOSIS — E871 Hypo-osmolality and hyponatremia: Secondary | ICD-10-CM | POA: Diagnosis present

## 2023-10-24 DIAGNOSIS — L819 Disorder of pigmentation, unspecified: Secondary | ICD-10-CM | POA: Diagnosis not present

## 2023-10-24 DIAGNOSIS — J449 Chronic obstructive pulmonary disease, unspecified: Secondary | ICD-10-CM | POA: Diagnosis present

## 2023-10-24 DIAGNOSIS — J9601 Acute respiratory failure with hypoxia: Secondary | ICD-10-CM | POA: Diagnosis present

## 2023-10-24 DIAGNOSIS — G9341 Metabolic encephalopathy: Secondary | ICD-10-CM | POA: Diagnosis present

## 2023-10-24 DIAGNOSIS — Z86718 Personal history of other venous thrombosis and embolism: Secondary | ICD-10-CM | POA: Diagnosis not present

## 2023-10-24 DIAGNOSIS — I898 Other specified noninfective disorders of lymphatic vessels and lymph nodes: Secondary | ICD-10-CM | POA: Diagnosis not present

## 2023-10-27 ENCOUNTER — Inpatient Hospital Stay: Payer: Medicare Other | Admitting: Internal Medicine

## 2023-10-27 DIAGNOSIS — L03116 Cellulitis of left lower limb: Secondary | ICD-10-CM | POA: Diagnosis not present

## 2023-10-27 DIAGNOSIS — I482 Chronic atrial fibrillation, unspecified: Secondary | ICD-10-CM | POA: Diagnosis not present

## 2023-10-27 DIAGNOSIS — R918 Other nonspecific abnormal finding of lung field: Secondary | ICD-10-CM | POA: Diagnosis not present

## 2023-10-27 DIAGNOSIS — J449 Chronic obstructive pulmonary disease, unspecified: Secondary | ICD-10-CM | POA: Diagnosis not present

## 2023-10-27 DIAGNOSIS — I890 Lymphedema, not elsewhere classified: Secondary | ICD-10-CM | POA: Diagnosis not present

## 2023-10-27 DIAGNOSIS — G8929 Other chronic pain: Secondary | ICD-10-CM | POA: Diagnosis not present

## 2023-10-27 DIAGNOSIS — Z7984 Long term (current) use of oral hypoglycemic drugs: Secondary | ICD-10-CM | POA: Diagnosis not present

## 2023-10-27 DIAGNOSIS — Z86718 Personal history of other venous thrombosis and embolism: Secondary | ICD-10-CM | POA: Diagnosis not present

## 2023-10-27 DIAGNOSIS — I10 Essential (primary) hypertension: Secondary | ICD-10-CM | POA: Diagnosis not present

## 2023-10-27 DIAGNOSIS — R270 Ataxia, unspecified: Secondary | ICD-10-CM | POA: Diagnosis not present

## 2023-10-29 DIAGNOSIS — M6281 Muscle weakness (generalized): Secondary | ICD-10-CM | POA: Diagnosis not present

## 2023-10-29 DIAGNOSIS — L98492 Non-pressure chronic ulcer of skin of other sites with fat layer exposed: Secondary | ICD-10-CM | POA: Diagnosis not present

## 2023-10-29 DIAGNOSIS — I480 Paroxysmal atrial fibrillation: Secondary | ICD-10-CM | POA: Diagnosis not present

## 2023-10-29 DIAGNOSIS — I739 Peripheral vascular disease, unspecified: Secondary | ICD-10-CM | POA: Diagnosis not present

## 2023-10-29 DIAGNOSIS — S80211A Abrasion, right knee, initial encounter: Secondary | ICD-10-CM | POA: Diagnosis not present

## 2023-10-29 DIAGNOSIS — L97822 Non-pressure chronic ulcer of other part of left lower leg with fat layer exposed: Secondary | ICD-10-CM | POA: Diagnosis not present

## 2023-10-29 DIAGNOSIS — R278 Other lack of coordination: Secondary | ICD-10-CM | POA: Diagnosis not present

## 2023-10-29 DIAGNOSIS — L97812 Non-pressure chronic ulcer of other part of right lower leg with fat layer exposed: Secondary | ICD-10-CM | POA: Diagnosis not present

## 2023-11-04 DIAGNOSIS — I890 Lymphedema, not elsewhere classified: Secondary | ICD-10-CM | POA: Diagnosis not present

## 2023-11-04 DIAGNOSIS — L97911 Non-pressure chronic ulcer of unspecified part of right lower leg limited to breakdown of skin: Secondary | ICD-10-CM | POA: Diagnosis not present

## 2023-11-04 DIAGNOSIS — I898 Other specified noninfective disorders of lymphatic vessels and lymph nodes: Secondary | ICD-10-CM | POA: Diagnosis not present

## 2023-11-05 DIAGNOSIS — Z4659 Encounter for fitting and adjustment of other gastrointestinal appliance and device: Secondary | ICD-10-CM | POA: Diagnosis not present

## 2023-11-05 DIAGNOSIS — I2694 Multiple subsegmental pulmonary emboli without acute cor pulmonale: Secondary | ICD-10-CM | POA: Diagnosis not present

## 2023-11-05 DIAGNOSIS — J9811 Atelectasis: Secondary | ICD-10-CM | POA: Diagnosis not present

## 2023-11-05 DIAGNOSIS — L819 Disorder of pigmentation, unspecified: Secondary | ICD-10-CM | POA: Diagnosis not present

## 2023-11-05 DIAGNOSIS — I824Z3 Acute embolism and thrombosis of unspecified deep veins of distal lower extremity, bilateral: Secondary | ICD-10-CM | POA: Diagnosis not present

## 2023-11-05 DIAGNOSIS — A419 Sepsis, unspecified organism: Secondary | ICD-10-CM | POA: Diagnosis not present

## 2023-11-05 DIAGNOSIS — I959 Hypotension, unspecified: Secondary | ICD-10-CM | POA: Diagnosis not present

## 2023-11-05 DIAGNOSIS — Z66 Do not resuscitate: Secondary | ICD-10-CM | POA: Diagnosis not present

## 2023-11-05 DIAGNOSIS — L03119 Cellulitis of unspecified part of limb: Secondary | ICD-10-CM | POA: Diagnosis not present

## 2023-11-05 DIAGNOSIS — L97822 Non-pressure chronic ulcer of other part of left lower leg with fat layer exposed: Secondary | ICD-10-CM | POA: Diagnosis not present

## 2023-11-05 DIAGNOSIS — R278 Other lack of coordination: Secondary | ICD-10-CM | POA: Diagnosis not present

## 2023-11-05 DIAGNOSIS — R531 Weakness: Secondary | ICD-10-CM | POA: Diagnosis not present

## 2023-11-05 DIAGNOSIS — I82409 Acute embolism and thrombosis of unspecified deep veins of unspecified lower extremity: Secondary | ICD-10-CM | POA: Diagnosis not present

## 2023-11-05 DIAGNOSIS — R6521 Severe sepsis with septic shock: Secondary | ICD-10-CM | POA: Diagnosis not present

## 2023-11-05 DIAGNOSIS — Z515 Encounter for palliative care: Secondary | ICD-10-CM | POA: Diagnosis not present

## 2023-11-05 DIAGNOSIS — I739 Peripheral vascular disease, unspecified: Secondary | ICD-10-CM | POA: Diagnosis not present

## 2023-11-05 DIAGNOSIS — R0902 Hypoxemia: Secondary | ICD-10-CM | POA: Diagnosis not present

## 2023-11-05 DIAGNOSIS — J189 Pneumonia, unspecified organism: Secondary | ICD-10-CM | POA: Diagnosis not present

## 2023-11-05 DIAGNOSIS — E870 Hyperosmolality and hypernatremia: Secondary | ICD-10-CM | POA: Diagnosis not present

## 2023-11-05 DIAGNOSIS — R6520 Severe sepsis without septic shock: Secondary | ICD-10-CM | POA: Diagnosis not present

## 2023-11-05 DIAGNOSIS — I480 Paroxysmal atrial fibrillation: Secondary | ICD-10-CM | POA: Diagnosis not present

## 2023-11-05 DIAGNOSIS — S80211D Abrasion, right knee, subsequent encounter: Secondary | ICD-10-CM | POA: Diagnosis not present

## 2023-11-05 DIAGNOSIS — I890 Lymphedema, not elsewhere classified: Secondary | ICD-10-CM | POA: Diagnosis not present

## 2023-11-05 DIAGNOSIS — I723 Aneurysm of iliac artery: Secondary | ICD-10-CM | POA: Diagnosis not present

## 2023-11-05 DIAGNOSIS — L02415 Cutaneous abscess of right lower limb: Secondary | ICD-10-CM | POA: Diagnosis not present

## 2023-11-05 DIAGNOSIS — G9341 Metabolic encephalopathy: Secondary | ICD-10-CM | POA: Diagnosis not present

## 2023-11-05 DIAGNOSIS — I2699 Other pulmonary embolism without acute cor pulmonale: Secondary | ICD-10-CM | POA: Diagnosis not present

## 2023-11-05 DIAGNOSIS — L98492 Non-pressure chronic ulcer of skin of other sites with fat layer exposed: Secondary | ICD-10-CM | POA: Diagnosis not present

## 2023-11-05 DIAGNOSIS — R0689 Other abnormalities of breathing: Secondary | ICD-10-CM | POA: Diagnosis not present

## 2023-11-05 DIAGNOSIS — Z4682 Encounter for fitting and adjustment of non-vascular catheter: Secondary | ICD-10-CM | POA: Diagnosis not present

## 2023-11-05 DIAGNOSIS — E872 Acidosis, unspecified: Secondary | ICD-10-CM | POA: Diagnosis not present

## 2023-11-05 DIAGNOSIS — I82413 Acute embolism and thrombosis of femoral vein, bilateral: Secondary | ICD-10-CM | POA: Diagnosis not present

## 2023-11-05 DIAGNOSIS — L03115 Cellulitis of right lower limb: Secondary | ICD-10-CM | POA: Diagnosis not present

## 2023-11-05 DIAGNOSIS — Z86718 Personal history of other venous thrombosis and embolism: Secondary | ICD-10-CM | POA: Diagnosis not present

## 2023-11-05 DIAGNOSIS — I7090 Unspecified atherosclerosis: Secondary | ICD-10-CM | POA: Diagnosis not present

## 2023-11-05 DIAGNOSIS — M6281 Muscle weakness (generalized): Secondary | ICD-10-CM | POA: Diagnosis not present

## 2023-11-05 DIAGNOSIS — Z86711 Personal history of pulmonary embolism: Secondary | ICD-10-CM | POA: Diagnosis not present

## 2023-11-05 DIAGNOSIS — M7989 Other specified soft tissue disorders: Secondary | ICD-10-CM | POA: Diagnosis not present

## 2023-11-05 DIAGNOSIS — I4891 Unspecified atrial fibrillation: Secondary | ICD-10-CM | POA: Diagnosis not present

## 2023-11-05 DIAGNOSIS — L97812 Non-pressure chronic ulcer of other part of right lower leg with fat layer exposed: Secondary | ICD-10-CM | POA: Diagnosis not present

## 2023-11-05 DIAGNOSIS — L02416 Cutaneous abscess of left lower limb: Secondary | ICD-10-CM | POA: Diagnosis not present

## 2023-11-05 DIAGNOSIS — L03116 Cellulitis of left lower limb: Secondary | ICD-10-CM | POA: Diagnosis not present

## 2023-11-05 DIAGNOSIS — J9601 Acute respiratory failure with hypoxia: Secondary | ICD-10-CM | POA: Diagnosis not present

## 2023-11-05 DIAGNOSIS — N179 Acute kidney failure, unspecified: Secondary | ICD-10-CM | POA: Diagnosis not present

## 2023-11-05 DIAGNOSIS — E871 Hypo-osmolality and hyponatremia: Secondary | ICD-10-CM | POA: Diagnosis not present

## 2023-11-05 DIAGNOSIS — J449 Chronic obstructive pulmonary disease, unspecified: Secondary | ICD-10-CM | POA: Diagnosis not present

## 2023-11-22 DEATH — deceased

## 2023-11-24 ENCOUNTER — Inpatient Hospital Stay: Payer: Medicare Other | Admitting: Internal Medicine

## 2023-11-28 ENCOUNTER — Telehealth: Payer: Self-pay

## 2023-11-28 NOTE — Telephone Encounter (Signed)
 Copied from CRM [id number]. Topic: General - Other >> Nov 28, 2023  2:27 PM Whitney O wrote: Reason for CRM: patient son calling to let clinic know he passed away 2023/11/29. Transferred to CAL line . Phones  Disconnected. Called patient back but he said he spoke with someone already . Do apologize for the disconnection  I changed patient's status to deceased in our system.

## 2023-12-04 ENCOUNTER — Ambulatory Visit: Payer: Medicare Other | Admitting: Pulmonary Disease

## 2024-06-29 NOTE — Progress Notes (Signed)
    Assessment & Plan:  1. Incidental lung nodule, > 3mm and < 8mm (Primary)  2. COPD, moderate (HCC)   Patient Instructions  We will see her in follow-up in 6 months time  Elouise Husband, RN is arranging for your follow-up CT.  We will review that CT when available  The nodule that is currently present is too small for biopsy at this time.  We will have to follow-up as above.  Please note: late entry documentation due to logistical difficulties during COVID-19 pandemic. This note is filed for information purposes only, and is not intended to be used for billing, nor does it represent the full scope/nature of the visit in question. Please see any associated scanned media linked to date of encounter for additional pertinent information.  Subjective:    HPI: Seth Perez. is a 83 y.o. male presenting to the pulmonology clinic on 10/20/2019 with report of: Follow-up (Pt states he's been feeling okay since last OV. Pt denies any SOB, coughing, wheezing, fever, or chills)     Outpatient Encounter Medications as of 10/20/2019  Medication Sig   VITAMIN D , CHOLECALCIFEROL , PO Take 5,000 Units by mouth daily.   [DISCONTINUED] albuterol  (VENTOLIN  HFA) 108 (90 Base) MCG/ACT inhaler Inhale 2 puffs into the lungs every 6 (six) hours as needed for wheezing. Reported on 02/28/2016 (Patient not taking: Reported on 11/14/2021)   [DISCONTINUED] Cobalamin Combinations (B-12) 1000-400 MCG SUBL Place 1,000 mcg under the tongue daily.   [DISCONTINUED] ELIQUIS  5 MG TABS tablet TAKE 1 TABLET BY MOUTH TWICE A DAY   [DISCONTINUED] simvastatin  (ZOCOR ) 20 MG tablet TAKE 1 TABLET BY MOUTH EVERYDAY AT BEDTIME   [DISCONTINUED] Triamcinolone Acetonide 0.025 % LOTN APPLY TOPICALLY TO AFFECTED AREA 3 TIMES DAILY FOR 7 DAYS   [DISCONTINUED] umeclidinium bromide  (INCRUSE ELLIPTA ) 62.5 MCG/INH AEPB Inhale 1 puff into the lungs daily.   No facility-administered encounter medications on file as of 10/20/2019.       Objective:   Vitals:   10/20/19 1010  BP: 130/72  Pulse: 63  Temp: (!) 97 F (36.1 C)  Height: 6' 4 (1.93 m)  Weight: 248 lb 6.4 oz (112.7 kg)  SpO2: 97% Comment: on ra  TempSrc: Temporal  BMI (Calculated): 30.25     Physical exam documentation is limited by delayed entry of information.
